# Patient Record
Sex: Male | Born: 1950
Health system: Southern US, Community
[De-identification: ages and names within clinical notes are randomized; demographics above are authoritative.]

## PROBLEM LIST (undated history)

## (undated) DIAGNOSIS — I480 Paroxysmal atrial fibrillation: Secondary | ICD-10-CM

## (undated) DIAGNOSIS — C801 Malignant (primary) neoplasm, unspecified: Secondary | ICD-10-CM

## (undated) DIAGNOSIS — Z86718 Personal history of other venous thrombosis and embolism: Secondary | ICD-10-CM

## (undated) DIAGNOSIS — G4730 Sleep apnea, unspecified: Secondary | ICD-10-CM

## (undated) DIAGNOSIS — M1990 Unspecified osteoarthritis, unspecified site: Secondary | ICD-10-CM

## (undated) DIAGNOSIS — Z7901 Long term (current) use of anticoagulants: Secondary | ICD-10-CM

## (undated) DIAGNOSIS — I119 Hypertensive heart disease without heart failure: Secondary | ICD-10-CM

## (undated) DIAGNOSIS — Z8546 Personal history of malignant neoplasm of prostate: Secondary | ICD-10-CM

## (undated) DIAGNOSIS — E785 Hyperlipidemia, unspecified: Secondary | ICD-10-CM

## (undated) DIAGNOSIS — I4891 Unspecified atrial fibrillation: Secondary | ICD-10-CM

## (undated) DIAGNOSIS — I10 Essential (primary) hypertension: Secondary | ICD-10-CM

## (undated) DIAGNOSIS — E669 Obesity, unspecified: Secondary | ICD-10-CM

## (undated) HISTORY — DX: Hypertensive heart disease without heart failure: I11.9

## (undated) HISTORY — PX: SHOULDER ARTHROTOMY: SHX1050

## (undated) HISTORY — PX: TRANSURETHRAL RESECTION OF PROSTATE: SHX73

## (undated) HISTORY — PX: TONSILLECTOMY: SUR1361

## (undated) HISTORY — PX: WISDOM TOOTH EXTRACTION: SHX21

## (undated) HISTORY — PX: COLONOSCOPY W/ POLYPECTOMY: SHX1380

## (undated) HISTORY — DX: Personal history of other venous thrombosis and embolism: Z86.718

## (undated) HISTORY — DX: Hyperlipidemia, unspecified: E78.5

## (undated) HISTORY — DX: Paroxysmal atrial fibrillation: I48.0

## (undated) HISTORY — DX: Personal history of malignant neoplasm of prostate: Z85.46

## (undated) HISTORY — DX: Sleep apnea, unspecified: G47.30

## (undated) HISTORY — PX: CARDIAC CATHETERIZATION: SHX172

## (undated) HISTORY — DX: Long term (current) use of anticoagulants: Z79.01

---

## 1999-01-21 ENCOUNTER — Encounter: Payer: Self-pay | Admitting: Internal Medicine

## 1999-01-21 ENCOUNTER — Ambulatory Visit (HOSPITAL_COMMUNITY): Admission: RE | Admit: 1999-01-21 | Discharge: 1999-01-21 | Payer: Self-pay | Admitting: Internal Medicine

## 1999-02-22 ENCOUNTER — Encounter: Payer: Self-pay | Admitting: Neurosurgery

## 1999-02-22 ENCOUNTER — Ambulatory Visit (HOSPITAL_COMMUNITY): Admission: RE | Admit: 1999-02-22 | Discharge: 1999-02-22 | Payer: Self-pay | Admitting: Neurosurgery

## 1999-03-10 ENCOUNTER — Encounter: Payer: Self-pay | Admitting: Neurosurgery

## 1999-03-10 ENCOUNTER — Ambulatory Visit (HOSPITAL_COMMUNITY): Admission: RE | Admit: 1999-03-10 | Discharge: 1999-03-10 | Payer: Self-pay | Admitting: Neurosurgery

## 1999-05-19 ENCOUNTER — Ambulatory Visit (HOSPITAL_COMMUNITY): Admission: RE | Admit: 1999-05-19 | Discharge: 1999-05-19 | Payer: Self-pay | Admitting: Gastroenterology

## 2000-01-17 ENCOUNTER — Encounter: Payer: Self-pay | Admitting: Neurosurgery

## 2000-01-17 ENCOUNTER — Ambulatory Visit (HOSPITAL_COMMUNITY): Admission: RE | Admit: 2000-01-17 | Discharge: 2000-01-17 | Payer: Self-pay | Admitting: Neurosurgery

## 2003-01-19 ENCOUNTER — Encounter: Payer: Self-pay | Admitting: Internal Medicine

## 2003-01-19 ENCOUNTER — Encounter: Admission: RE | Admit: 2003-01-19 | Discharge: 2003-01-19 | Payer: Self-pay | Admitting: Internal Medicine

## 2004-04-27 ENCOUNTER — Encounter: Admission: RE | Admit: 2004-04-27 | Discharge: 2004-04-27 | Payer: Self-pay | Admitting: Internal Medicine

## 2004-06-11 ENCOUNTER — Ambulatory Visit (HOSPITAL_COMMUNITY): Admission: RE | Admit: 2004-06-11 | Discharge: 2004-06-11 | Payer: Self-pay | Admitting: Internal Medicine

## 2004-12-08 ENCOUNTER — Encounter (INDEPENDENT_AMBULATORY_CARE_PROVIDER_SITE_OTHER): Payer: Self-pay | Admitting: Specialist

## 2004-12-08 ENCOUNTER — Ambulatory Visit (HOSPITAL_COMMUNITY): Admission: RE | Admit: 2004-12-08 | Discharge: 2004-12-08 | Payer: Self-pay | Admitting: Gastroenterology

## 2004-12-12 ENCOUNTER — Ambulatory Visit (HOSPITAL_COMMUNITY): Admission: RE | Admit: 2004-12-12 | Discharge: 2004-12-12 | Payer: Self-pay | Admitting: Internal Medicine

## 2005-04-19 ENCOUNTER — Encounter (INDEPENDENT_AMBULATORY_CARE_PROVIDER_SITE_OTHER): Payer: Self-pay | Admitting: Specialist

## 2005-04-19 ENCOUNTER — Inpatient Hospital Stay (HOSPITAL_COMMUNITY): Admission: RE | Admit: 2005-04-19 | Discharge: 2005-04-22 | Payer: Self-pay | Admitting: Urology

## 2007-02-08 ENCOUNTER — Ambulatory Visit (HOSPITAL_BASED_OUTPATIENT_CLINIC_OR_DEPARTMENT_OTHER): Admission: RE | Admit: 2007-02-08 | Discharge: 2007-02-08 | Payer: Self-pay | Admitting: Neurology

## 2007-02-17 ENCOUNTER — Ambulatory Visit: Payer: Self-pay | Admitting: Internal Medicine

## 2007-05-03 ENCOUNTER — Ambulatory Visit (HOSPITAL_BASED_OUTPATIENT_CLINIC_OR_DEPARTMENT_OTHER): Admission: RE | Admit: 2007-05-03 | Discharge: 2007-05-03 | Payer: Self-pay | Admitting: Internal Medicine

## 2007-05-12 ENCOUNTER — Ambulatory Visit: Payer: Self-pay | Admitting: Internal Medicine

## 2007-09-11 ENCOUNTER — Ambulatory Visit (HOSPITAL_BASED_OUTPATIENT_CLINIC_OR_DEPARTMENT_OTHER): Admission: RE | Admit: 2007-09-11 | Discharge: 2007-09-11 | Payer: Self-pay | Admitting: Orthopedic Surgery

## 2008-09-24 ENCOUNTER — Encounter: Admission: RE | Admit: 2008-09-24 | Discharge: 2008-09-24 | Payer: Self-pay | Admitting: Specialist

## 2009-01-01 ENCOUNTER — Ambulatory Visit (HOSPITAL_BASED_OUTPATIENT_CLINIC_OR_DEPARTMENT_OTHER): Admission: RE | Admit: 2009-01-01 | Discharge: 2009-01-01 | Payer: Self-pay | Admitting: Orthopedic Surgery

## 2009-01-14 ENCOUNTER — Encounter: Admission: RE | Admit: 2009-01-14 | Discharge: 2009-01-14 | Payer: Self-pay | Admitting: Internal Medicine

## 2010-09-27 ENCOUNTER — Encounter: Admission: RE | Admit: 2010-09-27 | Discharge: 2010-10-19 | Payer: Self-pay | Admitting: Sports Medicine

## 2011-03-20 LAB — BASIC METABOLIC PANEL
BUN: 14 mg/dL (ref 6–23)
CO2: 25 mEq/L (ref 19–32)
Chloride: 109 mEq/L (ref 96–112)
GFR calc non Af Amer: >60 mL/min (ref >60)
Glucose, Bld: 145 mg/dL — ABNORMAL HIGH (ref 70–99)
Potassium: 3.4 mEq/L — ABNORMAL LOW (ref 3.5–5.1)
Sodium: 140 mEq/L (ref 135–145)

## 2011-03-20 LAB — POCT HEMOGLOBIN-HEMACUE: Hemoglobin: 16.4 g/dL (ref 13.0–17.0)

## 2011-03-20 LAB — APTT: aPTT: 27 seconds (ref 24–37)

## 2011-04-18 NOTE — Procedures (Signed)
NAME:  [name], [name]           ACCOUNT NO.:  [id number]   MEDICAL RECORD NO.:  [id number]          PATIENT TYPE:  OUT   LOCATION:  SLEEP CENTER                 FACILITY:  [facility]   PHYSICIAN:  Clinton D. Maple Hudson, MD, FCCP, FACPDATE OF BIRTH:  01[birth year]07/50   DATE OF STUDY:  05/03/2007                            NOCTURNAL POLYSOMNOGRAM   REFERRING PHYSICIAN:   INDICATION FOR STUDY:  Insomnia with sleep apnea.   EPWORTH SLEEPINESS SCORE:  12/24, BMI 34.6, weight 256 pounds.   HOME MEDICATIONS:  Listed and reviewed.  A diagnostic study on February 08, 2007, recorded an AHI of 16.7 per hour.  CPAP titration is requested.   SLEEP ARCHITECTURE:  Total sleep time 308 minutes with sleep efficiency  74%.  Stage 1 was 12%, stage 2 72%, stages 3 and 4 absent, REM 16% of  total sleep time.  Sleep latency 16 minutes, REM latency 96 minutes,  awake after sleep onset 95 minutes, arousal index 5.6.  Topamax,  Coumadin, potassium, and calcium were taken at bedtime.   RESPIRATORY DATA:  CPAP titration protocol.  CPAP was titrated to 11  CWP, AHI 3.4 per hour.  A medium Mirage Quattro mask was chosen, with  heated humidifier.   OXYGEN DATA:  Snoring was prevented by CPAP, which held saturation at  94% on room air.   CARDIAC DATA:  Normal sinus rhythm.   MOVEMENT/PARASOMNIA:  Occasional limb jerk with little effect on sleep.   IMPRESSION/RECOMMENDATION:  1. Successful (CPAP) continuous positive airway pressure titration to      11 (CWP) centimeters of water pressure, (AHI) apnea/hypopnea index      3.4 per hour.  A medium Mirage Quattro mask was used with heated      humidifier.  2. Baseline diagnostic (NPSG) nocturnal polysomnogram on February 08, 2007, had recorded an (AHI) apnea/hypopnea index of 16.7 per hour.     Clinton D. Maple Hudson, MD, [facility], FACP  Diplomate, Biomedical engineer of Sleep Medicine  Electronically Signed    CDY/MEDQ  D:  05/11/2007 13:34:47  T:  05/11/2007 21:30:47  Job:   [id number]

## 2011-04-18 NOTE — Op Note (Signed)
[name], [name]           ACCOUNT NO.:  [id number]   MEDICAL RECORD NO.:  [id number]          PATIENT TYPE:  AMB   LOCATION:  DSC                          FACILITY:  [facility]   PHYSICIAN:  Harvie Junior, M.D.   DATE OF BIRTH:  [birth year]   DATE OF PROCEDURE:  09/11/2007  DATE OF DISCHARGE:                               OPERATIVE REPORT   PREOPERATIVE DIAGNOSES:  1. Impingement acromioclavicular joint arthritis.  2. Questionable rotator cuff tear.   POSTOPERATIVE DIAGNOSES:  1. Impingement acromioclavicular joint arthritis.  2. Questionable rotator cuff tear.  3. Partial thickness rotator cuff tear.   PRINCIPAL PROCEDURES:  1. Arthroscopic subacromial decompression from the lateral and      posterior compartment.  2. Arthroscopic distal clavicle resection from an anterior      compartment.  3. Arthroscopic debridement of the undersurface of rotator cuff as      well as superior rotator cuff as well as subtotal bursectomy.   SURGEON:  Harvie Junior, M.D.   ASSISTANT:  Marshia Ly, P.A.   ANESTHESIA:  General.   BRIEF HISTORY:  [name] is a 60 year old male with a long history  of having had significant impingement and pain in the shoulder.  We had  injected the shoulder a couple of times with excellent improvement, but  he continued to have pain.  He did not have tremendous weakness, but  just had significant pain and night pain.  We talked about treatment  options.   The routine arthroscopic examination revealed that there was an  undersurface rotator cuff tear which was debrided to significant levels.  The biceps tendon was identified and noted to be intact.  The glenoid  was evaluated and debrided.  At this point, the rotator cuff was  debrided on the undersurface and seemed to have a significant amount of  fibers intact.  We marked it with a PDS suture just in case and went out  of the glenohumeral joint and the subacromial space.  An anterolateral  acromioplasty was performed in the lateral posterior compartment.  Distal clavicle resection performed over 18 mm and at this point, the  rotator cuff was evaluated from the top side and noted to have  significant fray in the area where this mark was, so we carefully took  this out, debrided the rotator cuff from the top side, but really there  was excellent rotator cuff integrity, even at this point in the lead  edge of supraspinatus.  At this point, a subtotal bursectomy was  performed.  A finalization was made of the acromioplasty and distal  clavicle resection and once that was completed, the wound was copiously  and thoroughly irrigated and suctioned  dry.  The rest of our portals were closed with a bandage and sterile  compressive dressing was applied.  The patient was taken to recovery,  was noted to be in satisfactory condition.  The estimated blood loss  during the procedure was nothing.      Harvie Junior, M.D.  Electronically Signed     JLG/MEDQ  D:  09/11/2007  T:  09/11/2007  Job:  [id number]   cc:   Harvie Junior, M.D.

## 2011-04-18 NOTE — Op Note (Signed)
NAME:  [name], [name]           ACCOUNT NO.:  [id number]   MEDICAL RECORD NO.:  [id number]          PATIENT TYPE:  AMB   LOCATION:  DSC                          FACILITY:  [facility]   PHYSICIAN:  Harvie Junior, M.D.   DATE OF BIRTH:  1951/01/02   DATE OF PROCEDURE:  01/01/2009  DATE OF DISCHARGE:                               OPERATIVE REPORT   PREOPERATIVE DIAGNOSIS:  Distal clavicle pain status post arthroscopic  distal clavicle resection with a significant growth of heterotopic bone  in the lateral and inferior portions around the [facility] joint.   POSTOPERATIVE DIAGNOSIS:  Distal clavicle pain status post arthroscopic  distal clavicle resection with a significant growth of heterotopic bone  in the lateral and inferior portions around the [facility] joint.   PROCEDURE:  Open distal clavicle resection.   SURGEON:  Harvie Junior, MD   ASSISTANT:  Marshia Ly, PA   ANESTHESIA:  General.   BRIEF HISTORY:  Mr. Schertzer is a 60 year old male with a long history  of having had  subacromial decompression clavicle resection  arthroscopic.  He began having some pain at the [facility] joint.  X-ray show  that he unfortunately had a significant regrowth of heterotopic bone in  that area, interestingly postoperatively, we were reminiscent that he  had a significant bleed.  He takes Coumadin chronically and he had a  bleed in the area of the [facility] joint, which was quite significant.  We had  aspirated it once, but ultimately, I think this was probably the nature  of this.  He ended up growing heterotopic bone, it was such severe  growth of heterotopic bone that I was concerned about going back and  taking this out early, so we waited a year to allow this to mature and  then ultimately he was brought to the operating room for excision of  this.   DESCRIPTION OF PROCEDURE:  The patient was taken to the operating room.  After adequate anesthesia obtained with general anesthetic the patient  was placed supine  on the operating table.  The left arm was prepped and  draped in the usual sterile fashion.  After he was placed to the beach  chair position, all bony prominences were well padded.  Attention was  then turned to the left shoulder where 20 mL of 0.5% Marcaine with  1:100,000 epinephrine was instilled into this area to control bleeding.  At this point, a curved incision was made in Langer's line  subcutaneously and taken down to the level of distal clavicle, easily to  identify, we made an incision in the deltotrapezial fascia over the  distal clavicle and took this out on to the acromion.  We then began  resecting this bone, we used an ACL saw that has stopped and took out  the 2-cm of distal clavicle and then we had really resect this  anteriorly, posteriorly, inferiorly upon to the acromion.  It really it  was quite dramatic, this area of bone.  Ultimately we took this out and  then spent a fair amount of time irrigating and really stopping all  bleeding with  electrocautery, bone waxed the end of the clavicle, bone  waxed the edge of the acromion.  At that point, I again thoroughly  irrigated.  There was no evidence of bleeding.  We then closed the  deltotrapezial fascia with 1 Vicryl running, the skin with 2-0 Vicryl  and 3-0 Monocryl subcuticular stitching.  Benzoin Steri-Strips were  applied.  Sterile  compression dressing was applied.  The patient was taken to recovery and  was noted to be in satisfactory condition.  Of note, Marshia Ly was  present throughout the case and his help was critical exposure of the  case and ultimately was able to provide the cosmetic closure to allow to  limit the time in the operating room.      Harvie Junior, M.D.  Electronically Signed     JLG/MEDQ  D:  01/01/2009  T:  01/02/2009  Job:  [id number]

## 2011-04-21 NOTE — Op Note (Signed)
NAMELEANDRO, BERKOWITZ           ACCOUNT NO.:  [id number]   MEDICAL RECORD NO.:  [id number]          PATIENT TYPE:  AMB   LOCATION:  ENDO                         FACILITY:  [facility]   PHYSICIAN:  Danise Edge, M.D.   DATE OF BIRTH:  [birth year]   DATE OF PROCEDURE:  12/08/2004  DATE OF DISCHARGE:                                 OPERATIVE REPORT   PROCEDURE:  Colonoscopy and polypectomy.   PROCEDURE INDICATION:  Mr. Gorge Almanza is a 59 year old male born  1951/09/21.  Five years ago Mr. Arnell Sieving underwent a colonoscopy and  hyperplastic polyps were removed.  Mr. Arnell Sieving has intermittent painless  hematochezia.  His father died of digestive cancer.   ENDOSCOPIST:  Danise Edge, M.D.   PREMEDICATION:  Versed 6 mg, Versed 50 mg.   PROCEDURE:  After obtaining informed consent, Mr. Arnell Sieving was placed in  the left lateral decubitus position.  I administered intravenous Demerol and  intravenous Versed to achieve conscious sedation for the procedure.  The  patient's blood pressure, oxygen saturation, and cardiac rhythm were  monitored throughout the procedure and documented in the medical record.   Anal inspection and digital rectal exam were normal.  The Olympus adjustable  pediatric colonoscope was introduced into the rectum and advanced to the  cecum.  Colonic preparation for the exam today was excellent.   Rectum normal.   Sigmoid colon and descending colon normal.   Splenic flexure normal.   Transverse colon normal.   Hepatic flexure normal.   Ascending colon normal.   Cecum and ileocecal valve:  A 1 mm sessile polyp was removed from the  proximal cecum with the cold biopsy forceps.   ASSESSMENT:  A diminutive polyp was removed from the cecum; otherwise normal  proctocolonoscopy to the cecum.   RECOMMENDATIONS:  Mr. Arnell Sieving will resume taking his usual dose of  Coumadin today.  I have instructed him to have a prothrombin time drawn in  one  week.      MJ/MEDQ  D:  12/08/2004  T:  12/08/2004  Job:  [id number]   cc:   Thora Lance, M.D.  [address]  [place]  [place] [address]  Fax: [phone]

## 2011-04-21 NOTE — Discharge Summary (Signed)
NAMEDANEIL, BEEM           ACCOUNT NO.:  [id number]   MEDICAL RECORD NO.:  [id number]          PATIENT TYPE:  INP   LOCATION:  0365                         FACILITY:  [facility]   PHYSICIAN:  Valetta Fuller, M.D.  DATE OF BIRTH:  1951-10-08   DATE OF ADMISSION:  04/19/2005  DATE OF DISCHARGE:  04/22/2005                                 DISCHARGE SUMMARY   ADMITTING DIAGNOSIS:  Prostate cancer.   DISCHARGE DIAGNOSIS:  Prostate cancer.   PROCEDURE:  Radical retropubic prostatectomy performed Apr 19, 2005.   DISCHARGE MEDICATIONS:  The patient may resume previous home meds in  addition to Tylox and Lovenox.   ACTIVITY:  No heavy lifting or strenuous exercise for the next six  weeks.   DISCHARGED DIET:  Regular.   FOLLOW-UP:  Patient is to contact the urology center for follow-up  appointment.   BRIEF HISTORY:  Mr. Schill is a 60 year old gentleman with a previous  urologic history who presented for evaluation of mildly elevated PSA of 4.4.  A complex PSA was subsequently performed which demonstrated a free  percentage of 8. The patient subsequently underwent transrectal ultrasound-  guided biopsy of his prostate which demonstrated bilateral Gleason VI  adenocarcinoma. Significantly, the patient did have a history of DVT and is  on chronic anticoagulation. He has had an evaluation by his primary care  doctor and was not found to have any type of hypercoagulable state. His  primary care doctor thought it would be safe to discontinue his Coumadin  several days before surgery and may consider Lovenox following the  procedure. We will adhere to this plan. The patient was subsequently  admitted to [facility] to undergo radical retropubic  prostatectomy.   HOSPITAL COURSE:  Mr. Tally was admitted on Apr 19, 2005 and taken to  the operating room at which time he underwent radical retropubic  prostatectomy. Postoperatively, the patient was transferred to  PACU in  stable condition. For detailed description of the operation, please see the  typed operative note in the chart. The patient did well on the afternoon  following surgery and overnight, on postop day #1, remained afebrile and  hemodynamically stable. His hemoglobin on postop day #1 was 10.3 and his  Blake drain had put out only 30 mL since the time of surgery. The patient's  diet was subsequently advanced and his DVT prophylaxis was reinitiated. The  patient was able to ambulate in the hallway without difficulty.   On postop day #2, the patient's condition remained stable. He was able  tolerate a regular diet without difficulty and ambulate in hallway. Due to  his anticoagulation therapy, decision was made to monitor her for one more  night.   On the morning postop day #3, his Jackson-Pratt drain was removed. He was  discharged home in stable condition.   The plan will be for him to follow-up in urology center in 1 to 2 weeks. He  will contact the office for an appointment.       JP/MEDQ  D:  05/18/2005  T:  05/18/2005  Job:  [id number]

## 2011-04-21 NOTE — Procedures (Signed)
NAME:  COSMO, TETREAULT           ACCOUNT NO.:  [id number]   MEDICAL RECORD NO.:  [id number]          PATIENT TYPE:  OUT   LOCATION:  SLEEP CENTER                 FACILITY:  [facility]   PHYSICIAN:  Clinton D. Maple Hudson, MD, FCCP, FACPDATE OF BIRTH:  10/20/1951   DATE OF STUDY:  02/08/2007                            NOCTURNAL POLYSOMNOGRAM   INDICATION FOR STUDY:  Insomnia with sleep apnea.   EPWORTH SLEEPINESS SCORE:  11/24.   Height 6 feet, weight 256 pounds.   HOME MEDICATIONS:  Listed and reviewed.   SLEEP ARCHITECTURE:  Total sleep time 331 minutes, with sleep efficiency  75%.  Stage I was 7%, stage II 82%, stage III and IV 4%, REM 7% of total  sleep time.  Sleep latency 21 minutes, REM latency 147 minutes.  Awake  after sleep onset 84 minutes.  Arousal index 9.8.  No bedtime medication  was taken.   RESPIRATORY DATA:  Apnea-hypopnea index (AHI, RDI) 16.7 obstructive  events per hour, indicating moderate obstructive sleep apnea/hypopnea  syndrome.  There were 17 obstructive apneas and 75 hypopneas.  Events  were not positional. REM AHI 0.  He did not have enough early events to  meet criteria for split protocol on this study night.   OXYGEN DATA:  Mild to occasionally loud snoring, with oxygen  desaturation to a nadir of 86%.  Mean oxygen saturation through the  study was 93% on room air.   CARDIAC DATA:  Normal sinus rhythm.   MOVEMENT/PARASOMNIA:  Frequent limb jerks, with a total of 174 recorded,  but only 2 of these were associated with arousal or awakening, for an  insignificant periodic limb movement with arousal index of 0.4 per hour.   IMPRESSIONS/RECOMMENDATIONS:  1. Unremarkable sleep architecture for the sleep center environment,      without bedtime medication.  2. Moderate obstructive sleep apnea/hypopnea syndrome, AHI 16.7 per      hour, with nonpositional events, variable snoring, and oxygen      desaturation to a nadir of 86%.  3. Scores in this range may  qualify for a trial of CPAP therapy.      Consider return for CPAP titration or evaluate for alternative      therapies as appropriate.      Clinton D. Maple Hudson, MD, [facility], FACP  Diplomate, Biomedical engineer of Sleep Medicine  Electronically Signed     CDY/MEDQ  D:  02/17/2007 13:47:58  T:  02/18/2007 09:15:51  Job:  [id number]

## 2011-04-21 NOTE — Op Note (Signed)
NAMEARSHDEEP, BOLGER           ACCOUNT NO.:  [id number]   MEDICAL RECORD NO.:  [id number]          PATIENT TYPE:  INP   LOCATION:  0004                         [facility]ACILITY:  [facility]   PHYSICIAN:  Valetta Fuller, M.D.  DATE OF BIRTH:  [birth year]   DATE OF PROCEDURE:  04/19/2005  DATE OF DISCHARGE:                                 OPERATIVE REPORT   PREOPERATIVE DIAGNOSIS:  Clinical stage T1c adenocarcinoma of the prostate.   POSTOPERATIVE DIAGNOSIS:  Clinical stage T1c adenocarcinoma of the prostate.   PROCEDURE PERFORMED:  Radial retropubic prostatectomy.   SURGEON:  Barron Alvine, M.D.   ASSISTANT:  Bailey Mech   ANESTHESIA:  General endotracheal anesthesia.   INDICATIONS:  Mr. Markman is a 60 year old male.  He was recently  evaluated because of a persistently mildly elevated p.o. site between 4.0  and 4.5.  The patient had had an episode of prostatitis but with additional  times and antibiotics this p.o. never completely normalized.  The patient  was also noted to have a markedly reduced PSA reading to 8%.  Eventual  ultrasound revealed nothing significant with a normal prostate size.  Biopsies, however, revealed bilateral Gleason 3 + 3 = 6 adenocarcinoma of  the bladder involving approximately 5% or less of both sides of the  submitted material.  The patient underwent extensive counseling with guarded  treatment options for his clinical stage T1c disease.  This included  observation, radiation approaches or radical retropubic prostatectomy.  After discussing the pro's and con's of all approaches, he elected to  proceed with radical retropubic prostatectomy.  The patient did have an  extensive history of DVT in the past and had been on Coumadin.  He  understood that he was going to be at significantly increased risk.  He was  evaluated by his primary care doctor and apparently did not have any  hypocoagulable issues.  His primary care doctor thought it would be safe to  stop his Coumadin for five day and to reinitiate with Lovenox and Coumadin  several days after the procedure.  We thought overall he may be a better  potential candidate for seed implantation but the patient did want to have  radical prostatectomy.  We did not feel the lymph node dissection was really  indicated.  We felt that that would additional time to his surgery and also  potentially increase the risk for DVT given the manipulation along the iliac  vein.  He presents now for the surgery.   TECHNIQUE AND FINDINGS:  The patient was brought to the operating room where  he had successful induction of general endotracheal anesthesia.  Of note, he  was difficult intubation.  He was placed in the supine position.  Great care  was taken to be sure that all extremities were padded.  He was prepped and  draped in the usual manner.  A Foley catheter was then placed sterilely on  the field.  A standard lower midline incision was performed and the  retropubic space was entered.  Again we did not do a lymph node dissection.  For that reason, we  utilized in great care, placed some lateral wall  retractor blades.  The retropubic space was exposed and bladder was  retracted superiorly.  Endopelvic fascia was incised sharply bilaterally.  Puboprostatic ligaments were identified and sharply divided.  We were easily  able to palpate a groove between the dorsal vein complex and underlying  urethra.  A right angle clamp was passed between these structures and tied  with #1 Vicryl suture.  A suture ligature was also utilized.  The dorsal  vein complex was then transected and hemostasis was quite good.  There was a  very nice underlying urethral stump and the apex of the prostate was very  well preserved and pristine.  Palpation of this area did not reveal any  evidence of obvious induration near the apex laterally and we felt bilateral  nerve sparing procedure was reasonable.  The urethra was transected and  the  Foley catheter was brought up anteriorly.  The posterior wall of the urethra  was then transected and we used sharp dissection to transect the underlying  rectal urethralis muscle/fascia.  With this maneuver, the posterior plane  between the prostate and the rectum was very easily established with  dissection.   The endopelvic fascia was incised sharply on both sides releasing the  neurovascular bundles.  Posteriorly, we were able to identify the surface of  the seminal vesicles which then guided [place] to the proper plane for taking  down the lateral pedicles of the prostate.  Right angle Hem-o-lok clips were  used primarily for this.  Posteriorly, we were able to identify the vas  deferens which were clipped and divided.  The seminal vesicles were then  taken at approximately the mid-portion.  We felt the dissection all the way  to the tip could eventually potentially increase the risk for sexual  dysfunction and we felt that if we obtained the majority of the seminal  vesicle that would be adequate.  Once we had taken the lateral pedicles down  to the bladder neck, we turned to the bladder neck.  A combination of sharp  and blunt dissective technique was used to take the prostate off the bladder  neck preserving the circular fibers.  No bladder neck reconstruction was  necessary and at the completion of the bladder neck accepted the tip of my  little finger.  In this manner, the prostate was removed and sent for  pathologic evaluation.  The patient then underwent preparation for  reanastomosis of the bladder to the urethra.  We everted the mucosa  utilizing some 4-0 Vicryl sutures.  I had already preplaced two lateral  sutures in the urethral stump at the time of the transection of the urethra  with 2-0 Vicryl suture.  Two additional 2-0 Vicryl sutures were placed one  on both sides posteriorly and one at the 12 o'clock position.  These 2-0 Vicryl sutures were then placed in the  corresponding positions at the  bladder neck.  This was done over a 22-French Foley catheter.  The patient  remained quite stable and hemodynamically did quite well.  Estimated blood  loss was in the 800 to 1000 mL range.  The patient had been 2 units of  autologous blood.  We do not see indications for transfusion at least up to  that point.  The pelvis was copiously irrigated.  The bladder was also  copiously irrigated.  The anastomotic sutures were then tied.  The  anastomotic feel appeared to be excellent and there  was no evidence of IV  extravasation.  A separate stab incision was used to place the pelvic brain.  The fascia was closed with running #1 PDS suture and the skin was closed  with clips.  The patient appeared to tolerate the procedure well.  There  were no obvious complications or problems.  Sponge and needle counts were  correct.  He was brought to the recovery room in stable condition.      DSG/MEDQ  D:  04/19/2005  T:  04/19/2005  Job:  [id number]

## 2011-04-21 NOTE — H&P (Signed)
[name], [name]           ACCOUNT NO.:  [id number]   MEDICAL RECORD NO.:  [id number]          PATIENT TYPE:  INP   LOCATION:  0004                         FACILITY:  [facility]   PHYSICIAN:  Valetta Fuller, M.D.  DATE OF BIRTH:  20-Nov-1951   DATE OF ADMISSION:  04/19/2005  DATE OF DISCHARGE:                                HISTORY & PHYSICAL   CHIEF COMPLAINT:  Clinical stage T1C adenocarcinoma of the prostate for  radical retropubic prostatectomy.   HISTORY OF PRESENT ILLNESS:  [name] is a 60 year old male.  He has no  previous urologic history.  The patient had an episode of prostatitis about  6 months ago.  The patient's PSA was mildly elevated at 4.4 after treatment  of his prostatitis.  The feeling by his primary care doctor was that his PSA  elevation may be related to some lingering prostatitis.  The patient  subsequently had a repeat PSA which remained elevated at slightly over 4 and  he had a reduced PSA 2 reading of 8%.  When we saw him, he had no evidence  of ongoing prostatitis or urinary tract inflammation and therefore biopsies  were done.  This revealed a fairly normal size prostate.  The patient was  documented to have bilateral Gleason 6 adenocarcinoma of the prostate.  Less  than 5% of the material was positive on both sides.  The patient underwent  extensive counseling with regard to treatment options.  The patient did have  a history of DVT and has been on chronic anticoagulation.  He has had  evaluation by his primary care doctor and was not found to have any type of  hypercoagulable state.  His primary care doctor thought it would be safe to  discontinue his Coumadin several days before surgery and then consider  Lovenox status post the procedure.  We felt that the patient was potentially  a good candidate for seed implantation as well given what appeared to be low  volume, well differentiated cancer.  We laid out the pros and cons of  various  approaches.  The patient requested that surgery be performed.  We  felt that given his extremely low likelihood of lymph node metastasis, it  would be reasonable to forego pelvic lymph node dissection, especially in  light of the DVT, to minimize any manipulation of the iliac vessels and also  reduce operative room time.  The patient now presents for the surgery.  He  has no complaints and only has minimal voiding symptoms.   PAST MEDICAL HISTORY:  1.  DVT.  He has been on Coumadin for approximately 15 years.  2.  Osteoporosis.   CURRENT MEDICATIONS:  Fosamax, Topamax, hydrochlorothiazide, potassium  replacement, calcium, vitamin D.  He was also on Coumadin which has been on  hold.   ALLERGIES:  No drug allergies.   SOCIAL HISTORY:  He has a previous 20-30 pack year smoking history but quit  5-6 years ago.   FAMILY HISTORY:  Notable for diabetes and hypertension.  Negative for  prostate cancer.   PHYSICAL EXAMINATION:  GENERAL:  The patient is a  well-developed, well-  nourished male.  VITAL SIGNS:  His current weight is approximately 260 pounds.  He is  afebrile, pulse 68, blood pressure 112/70.  NECK:  No masses or JVD.  CHEST:  Clear to auscultation.  ABDOMEN:  Protuberant, soft, without masses or tenderness.  GENITOURINARY:  Penis shows normal meatus, scrotum, testes, and adnexal  structures.  Prostate is 1+ in size without worrisome nodules or induration.  EXTREMITIES:  There is no lower extremity edema or tenderness.   DATA:  CBC, BMET are within normal limits.   ASSESSMENT:  Clinical stage T1C adenocarcinoma of the prostate.  The patient  is to undergo pelvic lymph node dissection later this morning and hopefully  be admitted for routine postoperative care.      DSG/MEDQ  D:  04/19/2005  T:  04/19/2005  Job:  [id number]

## 2011-06-28 ENCOUNTER — Other Ambulatory Visit: Payer: Self-pay | Admitting: Dermatology

## 2011-09-14 LAB — BASIC METABOLIC PANEL
BUN: 12
GFR calc Af Amer: >60
GFR calc non Af Amer: >60
Potassium: 3.6

## 2011-09-14 LAB — POCT HEMOGLOBIN-HEMACUE: Operator id: 116011

## 2011-09-14 LAB — PROTIME-INR
INR: 1.2
Prothrombin Time: 15.5 — ABNORMAL HIGH

## 2012-05-14 ENCOUNTER — Other Ambulatory Visit: Payer: Self-pay | Admitting: Dermatology

## 2012-06-20 ENCOUNTER — Other Ambulatory Visit: Payer: Self-pay | Admitting: Dermatology

## 2014-06-15 ENCOUNTER — Other Ambulatory Visit: Payer: Self-pay | Admitting: Dermatology

## 2014-11-25 ENCOUNTER — Other Ambulatory Visit: Payer: Self-pay | Admitting: Dermatology

## 2014-12-07 DIAGNOSIS — Z7901 Long term (current) use of anticoagulants: Secondary | ICD-10-CM | POA: Diagnosis not present

## 2015-03-30 DIAGNOSIS — G43019 Migraine without aura, intractable, without status migrainosus: Secondary | ICD-10-CM | POA: Diagnosis not present

## 2015-03-30 DIAGNOSIS — G43111 Migraine with aura, intractable, with status migrainosus: Secondary | ICD-10-CM | POA: Diagnosis not present

## 2015-03-30 DIAGNOSIS — Z79899 Other long term (current) drug therapy: Secondary | ICD-10-CM | POA: Diagnosis not present

## 2015-03-30 DIAGNOSIS — R51 Headache: Secondary | ICD-10-CM | POA: Diagnosis not present

## 2015-04-08 DIAGNOSIS — L570 Actinic keratosis: Secondary | ICD-10-CM | POA: Diagnosis not present

## 2015-04-08 DIAGNOSIS — L821 Other seborrheic keratosis: Secondary | ICD-10-CM | POA: Diagnosis not present

## 2015-04-12 DIAGNOSIS — I82409 Acute embolism and thrombosis of unspecified deep veins of unspecified lower extremity: Secondary | ICD-10-CM | POA: Diagnosis not present

## 2015-04-12 DIAGNOSIS — Z7901 Long term (current) use of anticoagulants: Secondary | ICD-10-CM | POA: Diagnosis not present

## 2015-04-22 ENCOUNTER — Other Ambulatory Visit: Payer: Self-pay | Admitting: Gastroenterology

## 2015-06-03 ENCOUNTER — Other Ambulatory Visit: Payer: Self-pay | Admitting: Internal Medicine

## 2015-06-03 DIAGNOSIS — Z8546 Personal history of malignant neoplasm of prostate: Secondary | ICD-10-CM | POA: Diagnosis not present

## 2015-06-03 DIAGNOSIS — Z23 Encounter for immunization: Secondary | ICD-10-CM | POA: Diagnosis not present

## 2015-06-03 DIAGNOSIS — I10 Essential (primary) hypertension: Secondary | ICD-10-CM | POA: Diagnosis not present

## 2015-06-03 DIAGNOSIS — Z1389 Encounter for screening for other disorder: Secondary | ICD-10-CM | POA: Diagnosis not present

## 2015-06-03 DIAGNOSIS — G4733 Obstructive sleep apnea (adult) (pediatric): Secondary | ICD-10-CM | POA: Diagnosis not present

## 2015-06-03 DIAGNOSIS — Z0000 Encounter for general adult medical examination without abnormal findings: Secondary | ICD-10-CM | POA: Diagnosis not present

## 2015-06-03 DIAGNOSIS — Z125 Encounter for screening for malignant neoplasm of prostate: Secondary | ICD-10-CM | POA: Diagnosis not present

## 2015-06-03 DIAGNOSIS — M810 Age-related osteoporosis without current pathological fracture: Secondary | ICD-10-CM | POA: Diagnosis not present

## 2015-06-03 DIAGNOSIS — E291 Testicular hypofunction: Secondary | ICD-10-CM | POA: Diagnosis not present

## 2015-06-03 DIAGNOSIS — Z87891 Personal history of nicotine dependence: Secondary | ICD-10-CM

## 2015-06-03 DIAGNOSIS — R5383 Other fatigue: Secondary | ICD-10-CM | POA: Diagnosis not present

## 2015-06-03 DIAGNOSIS — E559 Vitamin D deficiency, unspecified: Secondary | ICD-10-CM | POA: Diagnosis not present

## 2015-06-10 ENCOUNTER — Ambulatory Visit
Admission: RE | Admit: 2015-06-10 | Discharge: 2015-06-10 | Disposition: A | Discharge status: Home / Self Care | Payer: Medicare Other | Source: Ambulatory Visit | Attending: Internal Medicine | Admitting: Internal Medicine

## 2015-06-10 DIAGNOSIS — Z87891 Personal history of nicotine dependence: Secondary | ICD-10-CM

## 2015-06-10 DIAGNOSIS — Z136 Encounter for screening for cardiovascular disorders: Secondary | ICD-10-CM | POA: Diagnosis not present

## 2015-06-23 DIAGNOSIS — E291 Testicular hypofunction: Secondary | ICD-10-CM | POA: Diagnosis not present

## 2015-07-14 DIAGNOSIS — E291 Testicular hypofunction: Secondary | ICD-10-CM | POA: Diagnosis not present

## 2015-07-23 DIAGNOSIS — Z7901 Long term (current) use of anticoagulants: Secondary | ICD-10-CM | POA: Diagnosis not present

## 2015-08-05 DIAGNOSIS — D3611 Benign neoplasm of peripheral nerves and autonomic nervous system of face, head, and neck: Secondary | ICD-10-CM | POA: Diagnosis not present

## 2015-08-05 DIAGNOSIS — D485 Neoplasm of uncertain behavior of skin: Secondary | ICD-10-CM | POA: Diagnosis not present

## 2015-09-02 DIAGNOSIS — D225 Melanocytic nevi of trunk: Secondary | ICD-10-CM | POA: Diagnosis not present

## 2015-09-02 DIAGNOSIS — Z85828 Personal history of other malignant neoplasm of skin: Secondary | ICD-10-CM | POA: Diagnosis not present

## 2015-09-02 DIAGNOSIS — L570 Actinic keratosis: Secondary | ICD-10-CM | POA: Diagnosis not present

## 2015-09-02 DIAGNOSIS — D485 Neoplasm of uncertain behavior of skin: Secondary | ICD-10-CM | POA: Diagnosis not present

## 2015-09-02 DIAGNOSIS — L089 Local infection of the skin and subcutaneous tissue, unspecified: Secondary | ICD-10-CM | POA: Diagnosis not present

## 2015-09-02 DIAGNOSIS — L821 Other seborrheic keratosis: Secondary | ICD-10-CM | POA: Diagnosis not present

## 2015-09-14 ENCOUNTER — Encounter (HOSPITAL_COMMUNITY): Admission: RE | Payer: Self-pay | Source: Ambulatory Visit

## 2015-09-14 ENCOUNTER — Ambulatory Visit (HOSPITAL_COMMUNITY): Admission: RE | Admit: 2015-09-14 | Payer: Medicare Other | Source: Ambulatory Visit | Admitting: Gastroenterology

## 2015-09-14 SURGERY — COLONOSCOPY WITH PROPOFOL
Anesthesia: Monitor Anesthesia Care

## 2015-09-28 DIAGNOSIS — I10 Essential (primary) hypertension: Secondary | ICD-10-CM | POA: Diagnosis not present

## 2015-09-28 DIAGNOSIS — Z23 Encounter for immunization: Secondary | ICD-10-CM | POA: Diagnosis not present

## 2015-09-28 DIAGNOSIS — G4733 Obstructive sleep apnea (adult) (pediatric): Secondary | ICD-10-CM | POA: Diagnosis not present

## 2015-09-28 DIAGNOSIS — M810 Age-related osteoporosis without current pathological fracture: Secondary | ICD-10-CM | POA: Diagnosis not present

## 2015-09-28 DIAGNOSIS — G43019 Migraine without aura, intractable, without status migrainosus: Secondary | ICD-10-CM | POA: Diagnosis not present

## 2015-09-28 DIAGNOSIS — R1314 Dysphagia, pharyngoesophageal phase: Secondary | ICD-10-CM | POA: Diagnosis not present

## 2015-09-28 DIAGNOSIS — G43111 Migraine with aura, intractable, with status migrainosus: Secondary | ICD-10-CM | POA: Diagnosis not present

## 2015-09-29 ENCOUNTER — Other Ambulatory Visit: Payer: Self-pay | Admitting: Internal Medicine

## 2015-09-29 DIAGNOSIS — R1310 Dysphagia, unspecified: Secondary | ICD-10-CM

## 2015-09-29 DIAGNOSIS — R1319 Other dysphagia: Secondary | ICD-10-CM

## 2015-10-04 ENCOUNTER — Ambulatory Visit
Admission: RE | Admit: 2015-10-04 | Discharge: 2015-10-04 | Disposition: A | Discharge status: Home / Self Care | Payer: Medicare Other | Source: Ambulatory Visit | Attending: Internal Medicine | Admitting: Internal Medicine

## 2015-10-04 DIAGNOSIS — R1310 Dysphagia, unspecified: Secondary | ICD-10-CM | POA: Diagnosis not present

## 2015-10-04 DIAGNOSIS — R1319 Other dysphagia: Secondary | ICD-10-CM

## 2015-10-08 ENCOUNTER — Encounter (HOSPITAL_COMMUNITY): Payer: Self-pay | Admitting: *Deleted

## 2015-10-08 ENCOUNTER — Other Ambulatory Visit: Payer: Self-pay | Admitting: Gastroenterology

## 2015-10-12 ENCOUNTER — Encounter (HOSPITAL_COMMUNITY)
Admission: RE | Disposition: A | Discharge status: Home / Self Care | Payer: Self-pay | Source: Ambulatory Visit | Attending: Gastroenterology

## 2015-10-12 ENCOUNTER — Ambulatory Visit (HOSPITAL_COMMUNITY): Payer: Medicare Other | Admitting: Anesthesiology

## 2015-10-12 ENCOUNTER — Encounter (HOSPITAL_COMMUNITY): Payer: Self-pay

## 2015-10-12 ENCOUNTER — Ambulatory Visit (HOSPITAL_COMMUNITY)
Admission: RE | Admit: 2015-10-12 | Discharge: 2015-10-12 | Disposition: A | Discharge status: Home / Self Care | Payer: Medicare Other | Source: Ambulatory Visit | Attending: Gastroenterology | Admitting: Gastroenterology

## 2015-10-12 DIAGNOSIS — Z86010 Personal history of colonic polyps: Secondary | ICD-10-CM | POA: Diagnosis not present

## 2015-10-12 DIAGNOSIS — Z87891 Personal history of nicotine dependence: Secondary | ICD-10-CM | POA: Insufficient documentation

## 2015-10-12 DIAGNOSIS — D128 Benign neoplasm of rectum: Secondary | ICD-10-CM | POA: Diagnosis not present

## 2015-10-12 DIAGNOSIS — Z86718 Personal history of other venous thrombosis and embolism: Secondary | ICD-10-CM | POA: Insufficient documentation

## 2015-10-12 DIAGNOSIS — Z1211 Encounter for screening for malignant neoplasm of colon: Secondary | ICD-10-CM | POA: Diagnosis not present

## 2015-10-12 DIAGNOSIS — G4733 Obstructive sleep apnea (adult) (pediatric): Secondary | ICD-10-CM | POA: Diagnosis not present

## 2015-10-12 DIAGNOSIS — M4806 Spinal stenosis, lumbar region: Secondary | ICD-10-CM | POA: Insufficient documentation

## 2015-10-12 DIAGNOSIS — Z8546 Personal history of malignant neoplasm of prostate: Secondary | ICD-10-CM | POA: Insufficient documentation

## 2015-10-12 DIAGNOSIS — I10 Essential (primary) hypertension: Secondary | ICD-10-CM | POA: Diagnosis not present

## 2015-10-12 DIAGNOSIS — K621 Rectal polyp: Secondary | ICD-10-CM | POA: Insufficient documentation

## 2015-10-12 DIAGNOSIS — K222 Esophageal obstruction: Secondary | ICD-10-CM | POA: Insufficient documentation

## 2015-10-12 HISTORY — DX: Sleep apnea, unspecified: G47.30

## 2015-10-12 HISTORY — PX: BALLOON DILATION: SHX5330

## 2015-10-12 HISTORY — PX: COLONOSCOPY WITH PROPOFOL: SHX5780

## 2015-10-12 HISTORY — DX: Personal history of other venous thrombosis and embolism: Z86.718

## 2015-10-12 HISTORY — PX: ESOPHAGOGASTRODUODENOSCOPY (EGD) WITH PROPOFOL: SHX5813

## 2015-10-12 HISTORY — DX: Malignant (primary) neoplasm, unspecified: C80.1

## 2015-10-12 HISTORY — DX: Essential (primary) hypertension: I10

## 2015-10-12 HISTORY — DX: Unspecified osteoarthritis, unspecified site: M19.90

## 2015-10-12 SURGERY — BALLOON DILATION
Anesthesia: Monitor Anesthesia Care

## 2015-10-12 MED ORDER — SODIUM CHLORIDE 0.9 % IV SOLN: INTRAVENOUS | Status: DC

## 2015-10-12 MED ORDER — FENTANYL CITRATE (PF) 100 MCG/2ML IJ SOLN
INTRAMUSCULAR | Status: DC | PRN
  Administered 2015-10-12 (×2): 50 ug via INTRAVENOUS

## 2015-10-12 MED ORDER — LACTATED RINGERS IV SOLN
INTRAVENOUS | Status: DC
  Administered 2015-10-12: 07:00:00 via INTRAVENOUS

## 2015-10-12 MED ORDER — PROPOFOL 10 MG/ML IV BOLUS
INTRAVENOUS | Status: AC
  Filled 2015-10-12: qty 20

## 2015-10-12 MED ORDER — PROPOFOL 500 MG/50ML IV EMUL
INTRAVENOUS | Status: DC | PRN
  Administered 2015-10-12: 140 ug/kg/min via INTRAVENOUS

## 2015-10-12 MED ORDER — FENTANYL CITRATE (PF) 100 MCG/2ML IJ SOLN
INTRAMUSCULAR | Status: AC
  Filled 2015-10-12: qty 4

## 2015-10-12 MED ORDER — BUTAMBEN-TETRACAINE-BENZOCAINE 2-2-14 % EX AERO
INHALATION_SPRAY | CUTANEOUS | Status: DC | PRN
  Administered 2015-10-12: 2 via TOPICAL

## 2015-10-12 MED ORDER — PROPOFOL 10 MG/ML IV BOLUS
INTRAVENOUS | Status: DC | PRN
  Administered 2015-10-12 (×3): 20 mg via INTRAVENOUS

## 2015-10-12 MED ORDER — LIDOCAINE HCL (CARDIAC) 20 MG/ML IV SOLN
INTRAVENOUS | Status: AC
  Filled 2015-10-12: qty 5

## 2015-10-12 MED ORDER — LIDOCAINE HCL (CARDIAC) 20 MG/ML IV SOLN
INTRAVENOUS | Status: DC | PRN
Start: 2015-10-12 — End: 2015-10-12
  Administered 2015-10-12: 100 mg via INTRAVENOUS

## 2015-10-12 SURGICAL SUPPLY — 24 items

## 2015-10-12 NOTE — Anesthesia Postprocedure Evaluation (Signed)
Anesthesia Post Note  Patient: [name]  Procedure(s) Performed: Procedure(s) (LRB): COLONOSCOPY WITH PROPOFOL (N/A) ESOPHAGOGASTRODUODENOSCOPY (EGD) WITH PROPOFOL (N/A) BALLOON DILATION (N/A)  Anesthesia type: MAC  Patient location: PACU  Post pain: Pain level controlled  Post assessment: Patient's Cardiovascular Status Stable  Last Vitals:  Filed Vitals:   10/12/15 0840  BP: 141/68  Pulse: 69  Temp:   Resp: 13    Post vital signs: Reviewed and stable  Level of consciousness: sedated  Complications: No apparent anesthesia complications

## 2015-10-12 NOTE — Anesthesia Preprocedure Evaluation (Signed)
Anesthesia Evaluation  Patient identified by MRN, date of birth, ID band Patient awake    Reviewed: Allergy & Precautions, NPO status , Patient's Chart, lab work & pertinent test results  Airway Mallampati: I  TM Distance: >3 FB Neck ROM: Full    Dental   Pulmonary former smoker,    Pulmonary exam normal        Cardiovascular hypertension, Pt. on medications Normal cardiovascular exam     Neuro/Psych    GI/Hepatic   Endo/Other    Renal/GU      Musculoskeletal   Abdominal   Peds  Hematology   Anesthesia Other Findings   Reproductive/Obstetrics                             Anesthesia Physical Anesthesia Plan  ASA: II  Anesthesia Plan: MAC   Post-op Pain Management:    Induction: Intravenous  Airway Management Planned: Simple Face Mask  Additional Equipment:   Intra-op Plan:   Post-operative Plan:   Informed Consent: I have reviewed the patients History and Physical, chart, labs and discussed the procedure including the risks, benefits and alternatives for the proposed anesthesia with the patient or authorized representative who has indicated his/her understanding and acceptance.     Plan Discussed with: CRNA and Surgeon  Anesthesia Plan Comments:         Anesthesia Quick Evaluation  

## 2015-10-12 NOTE — Op Note (Signed)
Procedures: Diagnostic esophagogastroduodenoscopy with distal esophageal stricture dilation followed by surveillance colonoscopy. 03/29/2010 colonoscopy performed with removal of two small tubular adenomatous polyps. 10/04/2015 barium esophagram was performed revealing a smoothly tapering short segment stricture in the distal esophagus.  Endoscopist: Earle Gell  Premedication: Propofol administered by anesthesia  Procedure: Diagnostic esophagogastroduodenoscopy with a benign distal esophageal stricture dilation The patient was placed in the left lateral decubitus position. The Pentax gastroscope was passed through the posterior hypopharynx into the proximal esophagus without difficulty. The hypopharynx, larynx, and vocal cords appeared normal.  Esophagoscopy: The proximal and mid segments of the esophageal mucosa appeared normal. At the esophagogastric junction noted at 45 cm from the incisor teeth was a benign stricture extending less than 1 cm in length. The distal esophageal mucosa otherwise appeared normal. I was able to traverse the stricture with the Pentax gastroscope. Using the esophageal balloon dilator, the benign stricture at the esophagogastric junction was dilated from 15 mm to 16.5 mm without apparent complications. There was no endoscopic evidence presence of erosive esophagitis or Barrett's.  Gastroscopy: Retroflex view of the gastric cardia and fundus was normal. The gastric body, antrum, and pylorus appeared normal.  Duodenoscopy: The duodenal bulb and descending duodenum appeared normal.  Assessment: Benign stricture at the esophagogastric junction was dilated from 15 mm to 16.5 mm using the esophageal balloon dilator. Otherwise normal esophagogastroduodenoscopy  Recommendation: Start omeprazole 20 mg before breakfast each morning to prevent acid reflux into the distal esophagus  Procedure: Surveillance colonoscopy Anal inspection and digital rectal exam were normal. The  Pentax pediatric colonoscope was introduced into the rectum and advanced to the cecum. A normal-appearing ileocecal valve and appendiceal orifice were identified. Colonic preparation for the exam today was good. Withdrawal time was 11 minutes  Rectum. From the mid rectum, a 4 mm sessile polyp was removed with the cold biopsy forceps. Retroflexed view of the distal rectum was normal  Sigmoid colon and descending colon. Normal  Splenic flexure. Normal  Transverse colon. Normal  Hepatic flexure. Normal  Ascending colon. Normal  Cecum and ileocecal valve. Normal  Assessment: A diminutive polyp was removed from the mid rectum with the cold biopsy forceps. Otherwise normal surveillance colonoscopy.  Recommendation: Schedule repeat surveillance colonoscopy in 5 years

## 2015-10-12 NOTE — H&P (Signed)
  Procedure: Surveillance colonoscopy. Diagnostic esophagogastroduodenoscopy with distal esophageal stricture dilation. 03/29/2010 colonoscopy was performed with removal of two small tubular adenomatous colon polyps. 11/31/2016 barium esophagram was performed showing a short segment smooth stricture in the distal esophagus.  History: The patient is a 64 year old male born 09-01-1951. He has chronic solid food esophageal dysphagia. He underwent a barium esophagram with tablet which showed a smooth tapering stricture at the esophagogastric junction; the 13 mm barium tablet stuck in the distal esophagus.  The patient underwent a colonoscopy in April 2011 with removal of adenomatous colon polyps.  To prevent recurrent deep venous thrombosis, the patient takes Coumadin on a daily basis. He stopped taking Coumadin 5 days ago and is on a Lovenox bridge. He received his last dose of Lovenox 24 hours ago.  The patient is scheduled to undergo diagnostic esophagogastroduodenoscopy with distal esophageal stricture dilation followed by surveillance colonoscopy today.  Past medical history: Recurrent deep venous thrombosis of the left leg. Hypertension. Migraine headache syndrome. Osteoporosis complicated by compression fractures. Vitamin D deficiency. Obstructive sleep apnea syndrome. Prostate cancer. Moderate lumbar spinal stenosis. Tonsillectomy. Radical prostatectomy. Left shoulder arthroscopy. Left shoulder surgery.  Exam: The patient is alert and lying comfortably on the endoscopy stretcher. Abdomen is soft and nontender to palpation. Lungs are clear to auscultation. Cardiac exam reveals a regular rhythm.  Plan: Proceed with diagnostic esophagogastroduodenoscopy, distal esophageal stricture dilation, and surveillance colonoscopy

## 2015-10-12 NOTE — Discharge Instructions (Signed)

## 2015-10-12 NOTE — Transfer of Care (Signed)
Immediate Anesthesia Transfer of Care Note  Patient: [name]  Procedure(s) Performed: Procedure(s): COLONOSCOPY WITH PROPOFOL (N/A) ESOPHAGOGASTRODUODENOSCOPY (EGD) WITH PROPOFOL (N/A) BALLOON DILATION (N/A)  Patient Location: Endoscopy Unit  Anesthesia Type:MAC  Level of Consciousness: awake, alert  and oriented  Airway & Oxygen Therapy: Patient Spontanous Breathing  Post-op Assessment: Report given to RN and Post -op Vital signs reviewed and stable  Post vital signs: Reviewed and stable  Last Vitals:  Filed Vitals:   10/12/15 0638  BP: 136/65  Pulse: 64  Temp: 36.7 C  Resp: 15    Complications: No apparent anesthesia complications

## 2015-10-13 ENCOUNTER — Encounter (HOSPITAL_COMMUNITY): Payer: Self-pay | Admitting: Gastroenterology

## 2015-10-15 DIAGNOSIS — Z7901 Long term (current) use of anticoagulants: Secondary | ICD-10-CM | POA: Diagnosis not present

## 2015-12-17 DIAGNOSIS — R5383 Other fatigue: Secondary | ICD-10-CM | POA: Diagnosis not present

## 2016-01-24 DIAGNOSIS — R42 Dizziness and giddiness: Secondary | ICD-10-CM | POA: Diagnosis not present

## 2016-01-24 DIAGNOSIS — I10 Essential (primary) hypertension: Secondary | ICD-10-CM | POA: Diagnosis not present

## 2016-01-24 DIAGNOSIS — R0600 Dyspnea, unspecified: Secondary | ICD-10-CM | POA: Diagnosis not present

## 2016-01-26 DIAGNOSIS — H269 Unspecified cataract: Secondary | ICD-10-CM | POA: Diagnosis not present

## 2016-01-30 DIAGNOSIS — Z7901 Long term (current) use of anticoagulants: Secondary | ICD-10-CM | POA: Diagnosis not present

## 2016-02-01 DIAGNOSIS — R42 Dizziness and giddiness: Secondary | ICD-10-CM | POA: Diagnosis not present

## 2016-02-01 DIAGNOSIS — R739 Hyperglycemia, unspecified: Secondary | ICD-10-CM | POA: Diagnosis not present

## 2016-02-01 DIAGNOSIS — R5383 Other fatigue: Secondary | ICD-10-CM | POA: Diagnosis not present

## 2016-02-02 DIAGNOSIS — R42 Dizziness and giddiness: Secondary | ICD-10-CM | POA: Diagnosis not present

## 2016-02-07 DIAGNOSIS — R0602 Shortness of breath: Secondary | ICD-10-CM | POA: Diagnosis not present

## 2016-02-07 DIAGNOSIS — I10 Essential (primary) hypertension: Secondary | ICD-10-CM | POA: Diagnosis not present

## 2016-02-07 DIAGNOSIS — I872 Venous insufficiency (chronic) (peripheral): Secondary | ICD-10-CM | POA: Diagnosis not present

## 2016-02-07 DIAGNOSIS — R5383 Other fatigue: Secondary | ICD-10-CM | POA: Diagnosis not present

## 2016-02-07 DIAGNOSIS — I82402 Acute embolism and thrombosis of unspecified deep veins of left lower extremity: Secondary | ICD-10-CM | POA: Diagnosis not present

## 2016-02-07 DIAGNOSIS — R079 Chest pain, unspecified: Secondary | ICD-10-CM | POA: Diagnosis not present

## 2016-02-15 DIAGNOSIS — R079 Chest pain, unspecified: Secondary | ICD-10-CM | POA: Diagnosis not present

## 2016-02-22 DIAGNOSIS — R079 Chest pain, unspecified: Secondary | ICD-10-CM | POA: Diagnosis not present

## 2016-02-22 DIAGNOSIS — I340 Nonrheumatic mitral (valve) insufficiency: Secondary | ICD-10-CM | POA: Diagnosis not present

## 2016-02-22 DIAGNOSIS — R9431 Abnormal electrocardiogram [ECG] [EKG]: Secondary | ICD-10-CM | POA: Diagnosis not present

## 2016-02-22 DIAGNOSIS — R002 Palpitations: Secondary | ICD-10-CM | POA: Diagnosis not present

## 2016-02-22 DIAGNOSIS — I208 Other forms of angina pectoris: Secondary | ICD-10-CM | POA: Diagnosis not present

## 2016-02-22 DIAGNOSIS — R42 Dizziness and giddiness: Secondary | ICD-10-CM | POA: Diagnosis not present

## 2016-02-22 DIAGNOSIS — I352 Nonrheumatic aortic (valve) stenosis with insufficiency: Secondary | ICD-10-CM | POA: Diagnosis not present

## 2016-02-23 DIAGNOSIS — R9431 Abnormal electrocardiogram [ECG] [EKG]: Secondary | ICD-10-CM | POA: Diagnosis not present

## 2016-02-23 DIAGNOSIS — R002 Palpitations: Secondary | ICD-10-CM | POA: Diagnosis not present

## 2016-02-23 DIAGNOSIS — I352 Nonrheumatic aortic (valve) stenosis with insufficiency: Secondary | ICD-10-CM | POA: Diagnosis not present

## 2016-02-23 DIAGNOSIS — I340 Nonrheumatic mitral (valve) insufficiency: Secondary | ICD-10-CM | POA: Diagnosis not present

## 2016-02-23 DIAGNOSIS — R079 Chest pain, unspecified: Secondary | ICD-10-CM | POA: Diagnosis not present

## 2016-02-23 DIAGNOSIS — I208 Other forms of angina pectoris: Secondary | ICD-10-CM | POA: Diagnosis not present

## 2016-02-23 DIAGNOSIS — R42 Dizziness and giddiness: Secondary | ICD-10-CM | POA: Diagnosis not present

## 2016-02-25 DIAGNOSIS — I340 Nonrheumatic mitral (valve) insufficiency: Secondary | ICD-10-CM | POA: Diagnosis not present

## 2016-02-25 DIAGNOSIS — I352 Nonrheumatic aortic (valve) stenosis with insufficiency: Secondary | ICD-10-CM | POA: Diagnosis not present

## 2016-02-25 DIAGNOSIS — R079 Chest pain, unspecified: Secondary | ICD-10-CM | POA: Diagnosis not present

## 2016-02-25 DIAGNOSIS — R42 Dizziness and giddiness: Secondary | ICD-10-CM | POA: Diagnosis not present

## 2016-02-25 DIAGNOSIS — I208 Other forms of angina pectoris: Secondary | ICD-10-CM | POA: Diagnosis not present

## 2016-02-25 DIAGNOSIS — R9431 Abnormal electrocardiogram [ECG] [EKG]: Secondary | ICD-10-CM | POA: Diagnosis not present

## 2016-02-25 DIAGNOSIS — R002 Palpitations: Secondary | ICD-10-CM | POA: Diagnosis not present

## 2016-03-13 DIAGNOSIS — I10 Essential (primary) hypertension: Secondary | ICD-10-CM | POA: Diagnosis not present

## 2016-03-13 DIAGNOSIS — R42 Dizziness and giddiness: Secondary | ICD-10-CM | POA: Diagnosis not present

## 2016-03-24 DIAGNOSIS — G43019 Migraine without aura, intractable, without status migrainosus: Secondary | ICD-10-CM | POA: Diagnosis not present

## 2016-03-24 DIAGNOSIS — G43111 Migraine with aura, intractable, with status migrainosus: Secondary | ICD-10-CM | POA: Diagnosis not present

## 2016-03-29 DIAGNOSIS — R42 Dizziness and giddiness: Secondary | ICD-10-CM | POA: Diagnosis not present

## 2016-03-29 DIAGNOSIS — I10 Essential (primary) hypertension: Secondary | ICD-10-CM | POA: Diagnosis not present

## 2016-03-29 DIAGNOSIS — G43909 Migraine, unspecified, not intractable, without status migrainosus: Secondary | ICD-10-CM | POA: Diagnosis not present

## 2016-04-06 DIAGNOSIS — M859 Disorder of bone density and structure, unspecified: Secondary | ICD-10-CM | POA: Diagnosis not present

## 2016-04-06 DIAGNOSIS — M8589 Other specified disorders of bone density and structure, multiple sites: Secondary | ICD-10-CM | POA: Diagnosis not present

## 2016-06-02 DIAGNOSIS — G4733 Obstructive sleep apnea (adult) (pediatric): Secondary | ICD-10-CM | POA: Diagnosis not present

## 2016-06-02 DIAGNOSIS — G43909 Migraine, unspecified, not intractable, without status migrainosus: Secondary | ICD-10-CM | POA: Diagnosis not present

## 2016-06-02 DIAGNOSIS — Z8546 Personal history of malignant neoplasm of prostate: Secondary | ICD-10-CM | POA: Diagnosis not present

## 2016-06-02 DIAGNOSIS — E291 Testicular hypofunction: Secondary | ICD-10-CM | POA: Diagnosis not present

## 2016-06-02 DIAGNOSIS — Z0000 Encounter for general adult medical examination without abnormal findings: Secondary | ICD-10-CM | POA: Diagnosis not present

## 2016-06-02 DIAGNOSIS — Z1389 Encounter for screening for other disorder: Secondary | ICD-10-CM | POA: Diagnosis not present

## 2016-06-02 DIAGNOSIS — I10 Essential (primary) hypertension: Secondary | ICD-10-CM | POA: Diagnosis not present

## 2016-06-02 DIAGNOSIS — Z125 Encounter for screening for malignant neoplasm of prostate: Secondary | ICD-10-CM | POA: Diagnosis not present

## 2016-06-02 DIAGNOSIS — Z23 Encounter for immunization: Secondary | ICD-10-CM | POA: Diagnosis not present

## 2016-06-02 DIAGNOSIS — M810 Age-related osteoporosis without current pathological fracture: Secondary | ICD-10-CM | POA: Diagnosis not present

## 2016-06-02 DIAGNOSIS — Z5181 Encounter for therapeutic drug level monitoring: Secondary | ICD-10-CM | POA: Diagnosis not present

## 2016-06-18 DIAGNOSIS — Z7901 Long term (current) use of anticoagulants: Secondary | ICD-10-CM | POA: Diagnosis not present

## 2016-06-29 DIAGNOSIS — K6289 Other specified diseases of anus and rectum: Secondary | ICD-10-CM | POA: Diagnosis not present

## 2016-06-30 DIAGNOSIS — K6289 Other specified diseases of anus and rectum: Secondary | ICD-10-CM | POA: Diagnosis not present

## 2016-07-03 DIAGNOSIS — K6289 Other specified diseases of anus and rectum: Secondary | ICD-10-CM | POA: Diagnosis not present

## 2016-07-06 ENCOUNTER — Other Ambulatory Visit: Payer: Self-pay | Admitting: Surgery

## 2016-07-06 DIAGNOSIS — R229 Localized swelling, mass and lump, unspecified: Principal | ICD-10-CM

## 2016-07-06 DIAGNOSIS — IMO0002 Reserved for concepts with insufficient information to code with codable children: Secondary | ICD-10-CM

## 2016-07-12 ENCOUNTER — Ambulatory Visit
Admission: RE | Admit: 2016-07-12 | Discharge: 2016-07-12 | Disposition: A | Discharge status: Home / Self Care | Payer: Medicare Other | Source: Ambulatory Visit | Attending: Surgery | Admitting: Surgery

## 2016-07-12 DIAGNOSIS — IMO0002 Reserved for concepts with insufficient information to code with codable children: Secondary | ICD-10-CM

## 2016-07-12 DIAGNOSIS — R229 Localized swelling, mass and lump, unspecified: Principal | ICD-10-CM

## 2016-07-12 DIAGNOSIS — N281 Cyst of kidney, acquired: Secondary | ICD-10-CM | POA: Diagnosis not present

## 2016-07-12 MED ORDER — IOPAMIDOL (ISOVUE-300) INJECTION 61%
125.0000 mL | Freq: Once | INTRAVENOUS | Status: AC | PRN
  Administered 2016-07-12: 125 mL via INTRAVENOUS

## 2016-08-08 DIAGNOSIS — K6289 Other specified diseases of anus and rectum: Secondary | ICD-10-CM | POA: Diagnosis not present

## 2016-09-04 DIAGNOSIS — I10 Essential (primary) hypertension: Secondary | ICD-10-CM | POA: Diagnosis not present

## 2016-09-04 DIAGNOSIS — M79605 Pain in left leg: Secondary | ICD-10-CM | POA: Diagnosis not present

## 2016-09-07 DIAGNOSIS — Z23 Encounter for immunization: Secondary | ICD-10-CM | POA: Diagnosis not present

## 2016-09-09 ENCOUNTER — Encounter (HOSPITAL_COMMUNITY): Payer: Self-pay | Admitting: *Deleted

## 2016-09-09 ENCOUNTER — Emergency Department (HOSPITAL_COMMUNITY): Payer: Medicare Other

## 2016-09-09 ENCOUNTER — Inpatient Hospital Stay (HOSPITAL_COMMUNITY)
Admission: EM | Admit: 2016-09-09 | Discharge: 2016-09-10 | DRG: 310 | Disposition: A | Discharge status: Home / Self Care | Payer: Medicare Other | Attending: Cardiology | Admitting: Cardiology

## 2016-09-09 DIAGNOSIS — R002 Palpitations: Secondary | ICD-10-CM

## 2016-09-09 DIAGNOSIS — Z8546 Personal history of malignant neoplasm of prostate: Secondary | ICD-10-CM

## 2016-09-09 DIAGNOSIS — Z86718 Personal history of other venous thrombosis and embolism: Secondary | ICD-10-CM

## 2016-09-09 DIAGNOSIS — I119 Hypertensive heart disease without heart failure: Secondary | ICD-10-CM | POA: Diagnosis present

## 2016-09-09 DIAGNOSIS — G4733 Obstructive sleep apnea (adult) (pediatric): Secondary | ICD-10-CM | POA: Diagnosis present

## 2016-09-09 DIAGNOSIS — I480 Paroxysmal atrial fibrillation: Secondary | ICD-10-CM | POA: Diagnosis present

## 2016-09-09 DIAGNOSIS — E669 Obesity, unspecified: Secondary | ICD-10-CM | POA: Diagnosis present

## 2016-09-09 DIAGNOSIS — R0602 Shortness of breath: Secondary | ICD-10-CM | POA: Diagnosis not present

## 2016-09-09 DIAGNOSIS — R079 Chest pain, unspecified: Secondary | ICD-10-CM | POA: Diagnosis not present

## 2016-09-09 DIAGNOSIS — Z91013 Allergy to seafood: Secondary | ICD-10-CM

## 2016-09-09 DIAGNOSIS — Z7901 Long term (current) use of anticoagulants: Secondary | ICD-10-CM | POA: Diagnosis not present

## 2016-09-09 DIAGNOSIS — Z87891 Personal history of nicotine dependence: Secondary | ICD-10-CM | POA: Diagnosis not present

## 2016-09-09 DIAGNOSIS — I4891 Unspecified atrial fibrillation: Secondary | ICD-10-CM | POA: Diagnosis not present

## 2016-09-09 DIAGNOSIS — G4730 Sleep apnea, unspecified: Secondary | ICD-10-CM

## 2016-09-09 HISTORY — DX: Unspecified atrial fibrillation: I48.91

## 2016-09-09 HISTORY — DX: Obesity, unspecified: E66.9

## 2016-09-09 LAB — BASIC METABOLIC PANEL
ANION GAP: 10 (ref 5–15)
Anion gap: 11 (ref 5–15)
BUN: 16 mg/dL (ref 6–20)
BUN: 19 mg/dL (ref 6–20)
CALCIUM: 9.1 mg/dL (ref 8.9–10.3)
CHLORIDE: 106 mmol/L (ref 101–111)
CO2: 23 mmol/L (ref 22–32)
CO2: 24 mmol/L (ref 22–32)
CREATININE: 1.07 mg/dL (ref 0.61–1.24)
Calcium: 9.6 mg/dL (ref 8.9–10.3)
Chloride: 106 mmol/L (ref 101–111)
Creatinine, Ser: 1.21 mg/dL (ref 0.61–1.24)
GFR calc Af Amer: >60 mL/min (ref >60)
GFR calc non Af Amer: >60 mL/min (ref >60)
GLUCOSE: 104 mg/dL — AB (ref 65–99)
GLUCOSE: 99 mg/dL (ref 65–99)
POTASSIUM: 3.8 mmol/L (ref 3.5–5.1)
Potassium: 3.5 mmol/L (ref 3.5–5.1)
Sodium: 139 mmol/L (ref 135–145)
Sodium: 141 mmol/L (ref 135–145)

## 2016-09-09 LAB — LIPID PANEL
Cholesterol: 169 mg/dL (ref 0–200)
HDL: 49 mg/dL (ref >40)
LDL CALC: 105 mg/dL — AB (ref 0–99)
Total CHOL/HDL Ratio: 3.4 RATIO
Triglycerides: 77 mg/dL (ref <150)
VLDL: 15 mg/dL (ref 0–40)

## 2016-09-09 LAB — MRSA PCR SCREENING: MRSA by PCR: NEGATIVE

## 2016-09-09 LAB — CBC
HEMATOCRIT: 49.4 % (ref 39.0–52.0)
HEMATOCRIT: 52.9 % — AB (ref 39.0–52.0)
HEMOGLOBIN: 16.9 g/dL (ref 13.0–17.0)
HEMOGLOBIN: 18.3 g/dL — AB (ref 13.0–17.0)
MCH: 31.3 pg (ref 26.0–34.0)
MCH: 30.8 pg (ref 26.0–34.0)
MCHC: 34.2 g/dL (ref 30.0–36.0)
MCHC: 34.6 g/dL (ref 30.0–36.0)
MCV: 90 fL (ref 78.0–100.0)
MCV: 90.6 fL (ref 78.0–100.0)
Platelets: 177 10*3/uL (ref 150–400)
Platelets: 164 10*3/uL (ref 150–400)
RBC: 5.84 MIL/uL — ABNORMAL HIGH (ref 4.22–5.81)
RBC: 5.49 MIL/uL (ref 4.22–5.81)
RDW: 14.2 % (ref 11.5–15.5)
RDW: 14.2 % (ref 11.5–15.5)
WBC: 11.9 10*3/uL — ABNORMAL HIGH (ref 4.0–10.5)
WBC: 10.1 10*3/uL (ref 4.0–10.5)

## 2016-09-09 LAB — PROTIME-INR
INR: 2.66
INR: 2.68
Prothrombin Time: 28.9 seconds — ABNORMAL HIGH (ref 11.4–15.2)
Prothrombin Time: 29.1 seconds — ABNORMAL HIGH (ref 11.4–15.2)

## 2016-09-09 LAB — MAGNESIUM: MAGNESIUM: 2.1 mg/dL (ref 1.7–2.4)

## 2016-09-09 LAB — I-STAT TROPONIN, ED: Troponin i, poc: 0 ng/mL (ref 0.00–0.08)

## 2016-09-09 LAB — TSH: TSH: 3.46 u[IU]/mL (ref 0.350–4.500)

## 2016-09-09 MED ORDER — PREDNISONE 20 MG PO TABS
40.0000 mg | ORAL_TABLET | Freq: Every day | ORAL | Status: DC
  Filled 2016-09-09: qty 2

## 2016-09-09 MED ORDER — DILTIAZEM HCL-DEXTROSE 100-5 MG/100ML-% IV SOLN (PREMIX)
5.0000 mg/h | INTRAVENOUS | Status: DC
  Administered 2016-09-09: 5 mg/h via INTRAVENOUS
  Filled 2016-09-09: qty 100

## 2016-09-09 MED ORDER — ACETAMINOPHEN 325 MG PO TABS: 650.0000 mg | ORAL_TABLET | ORAL | Status: DC | PRN

## 2016-09-09 MED ORDER — LISINOPRIL-HYDROCHLOROTHIAZIDE 20-12.5 MG PO TABS: 1.0000 | ORAL_TABLET | Freq: Every day | ORAL | Status: DC

## 2016-09-09 MED ORDER — POTASSIUM CHLORIDE CRYS ER 10 MEQ PO TBCR
40.0000 meq | EXTENDED_RELEASE_TABLET | Freq: Once | ORAL | Status: AC
  Administered 2016-09-09: 40 meq via ORAL
  Filled 2016-09-09 (×2): qty 4

## 2016-09-09 MED ORDER — HYDROCHLOROTHIAZIDE 12.5 MG PO CAPS
12.5000 mg | ORAL_CAPSULE | Freq: Every day | ORAL | Status: DC
  Administered 2016-09-09 – 2016-09-10 (×2): 12.5 mg via ORAL
  Filled 2016-09-09 (×2): qty 1

## 2016-09-09 MED ORDER — ONDANSETRON HCL 4 MG/2ML IJ SOLN: 4.0000 mg | Freq: Four times a day (QID) | INTRAMUSCULAR | Status: DC | PRN

## 2016-09-09 MED ORDER — DILTIAZEM HCL-DEXTROSE 100-5 MG/100ML-% IV SOLN (PREMIX): 5.0000 mg/h | Freq: Once | INTRAVENOUS | Status: DC

## 2016-09-09 MED ORDER — DILTIAZEM HCL-DEXTROSE 100-5 MG/100ML-% IV SOLN (PREMIX)
5.0000 mg/h | INTRAVENOUS | Status: DC
  Filled 2016-09-09: qty 100

## 2016-09-09 MED ORDER — POTASSIUM CHLORIDE CRYS ER 20 MEQ PO TBCR
20.0000 meq | EXTENDED_RELEASE_TABLET | Freq: Every day | ORAL | Status: DC
  Administered 2016-09-09 – 2016-09-10 (×2): 20 meq via ORAL
  Filled 2016-09-09 (×2): qty 1

## 2016-09-09 MED ORDER — WARFARIN SODIUM 7.5 MG PO TABS
7.5000 mg | ORAL_TABLET | ORAL | Status: AC
  Administered 2016-09-09: 7.5 mg via ORAL
  Filled 2016-09-09: qty 1

## 2016-09-09 MED ORDER — LISINOPRIL 20 MG PO TABS
20.0000 mg | ORAL_TABLET | Freq: Every day | ORAL | Status: DC
  Administered 2016-09-09 – 2016-09-10 (×2): 20 mg via ORAL
  Filled 2016-09-09 (×2): qty 1

## 2016-09-09 MED ORDER — WARFARIN - PHARMACIST DOSING INPATIENT: Freq: Every day | Status: DC

## 2016-09-09 MED ORDER — WARFARIN SODIUM 5 MG PO TABS: 5.0000 mg | ORAL_TABLET | ORAL | Status: DC

## 2016-09-09 MED ORDER — OFF THE BEAT BOOK
Freq: Once | Status: AC
  Administered 2016-09-09: 14:00:00
  Filled 2016-09-09: qty 1

## 2016-09-09 MED ORDER — CALCIUM CARBONATE-VITAMIN D 500-200 MG-UNIT PO TABS
1.0000 | ORAL_TABLET | Freq: Two times a day (BID) | ORAL | Status: DC
  Administered 2016-09-09 – 2016-09-10 (×3): 1 via ORAL
  Filled 2016-09-09 (×3): qty 1

## 2016-09-09 MED ORDER — DILTIAZEM HCL 100 MG IV SOLR: 5.0000 mg/h | Freq: Once | INTRAVENOUS | Status: DC

## 2016-09-09 MED ORDER — FLECAINIDE ACETATE 100 MG PO TABS
100.0000 mg | ORAL_TABLET | Freq: Two times a day (BID) | ORAL | Status: DC
  Administered 2016-09-09 – 2016-09-10 (×3): 100 mg via ORAL
  Filled 2016-09-09 (×3): qty 1

## 2016-09-09 MED ORDER — DILTIAZEM HCL 60 MG PO TABS
60.0000 mg | ORAL_TABLET | Freq: Three times a day (TID) | ORAL | Status: DC
Start: 2016-09-09 — End: 2016-09-10
  Administered 2016-09-09 – 2016-09-10 (×4): 60 mg via ORAL
  Filled 2016-09-09 (×4): qty 1

## 2016-09-09 MED ORDER — DILTIAZEM LOAD VIA INFUSION
10.0000 mg | Freq: Once | INTRAVENOUS | Status: AC
  Administered 2016-09-09: 10 mg via INTRAVENOUS
  Filled 2016-09-09: qty 10

## 2016-09-09 MED ORDER — VITAMIN D 1000 UNITS PO TABS
1000.0000 [IU] | ORAL_TABLET | Freq: Two times a day (BID) | ORAL | Status: DC
  Administered 2016-09-09 – 2016-09-10 (×3): 1000 [IU] via ORAL
  Filled 2016-09-09 (×3): qty 1

## 2016-09-09 NOTE — H&P (Addendum)
History & Physical    Patient ID: [name] MRN: [id number], DOB/AGE: 66/22/1952   Admit date: 09/09/2016   Primary Physician: Irven Shelling, MD Primary Cardiologist: None  Patient Profile    [phone] with prostate CA s/p prostatectomy, esophageal stricture s/p dilatation, DVT on chronic warfarin, HTN, OSA on CPAP, who presents with new onset AF.   Past Medical History    Past Medical History:  Diagnosis Date  . Arthritis    arthritis -back  . Cancer [facility])    cancer Prostate- surgery only  . History of DVT of lower extremity    left leg has some residual crculation issues  . Hypertension   . Sleep apnea    cpap used    Past Surgical History:  Procedure Laterality Date  . BALLOON DILATION N/A 10/12/2015   Procedure: BALLOON DILATION;  Surgeon: Garlan Fair, MD;  Location: [facility];  Service: Endoscopy;  Laterality: N/A;  . COLONOSCOPY W/ POLYPECTOMY     x2 colon polyps found  . COLONOSCOPY WITH PROPOFOL N/A 10/12/2015   Procedure: COLONOSCOPY WITH PROPOFOL;  Surgeon: Garlan Fair, MD;  Location: [facility];  Service: Endoscopy;  Laterality: N/A;  . ESOPHAGOGASTRODUODENOSCOPY (EGD) WITH PROPOFOL N/A 10/12/2015   Procedure: ESOPHAGOGASTRODUODENOSCOPY (EGD) WITH PROPOFOL;  Surgeon: Garlan Fair, MD;  Location: [facility];  Service: Endoscopy;  Laterality: N/A;  . SHOULDER ARTHROTOMY     x2 procedures- 1 open, 1 scope.  . TONSILLECTOMY    . TRANSURETHRAL RESECTION OF PROSTATE       Allergies  Allergies  Allergen Reactions  . Scallops [Shellfish Allergy] Diarrhea and Nausea And Vomiting    History of Present Illness    88M with prostate CA s/p prostatectomy, esophageal stricture s/p dilatation, DVT on chronic warfarin, HTN, OSA on CPAP, who presents with new onset AF.   [name] states that he went to bed at 11pm last night and awoke at 11:30pm with chest pounding, SOB, and rapid pulse. He felt dyspneic at rest and decided to come to  the ER.  Of note, he has a similar feeling episode about 3 weeks ago. It came on in the evening around 8pm and gave him difficulty sleeping but he was eventually able to go to sleep and symptoms resolved by morning. He reports of the last few months, he has felt more easily fatigued and short winded, with occasional palpitations.   On arrival to the ER, the patient was tachycardic to 175bpm with BP 119/69, RR 16, 99%. ECG demonstrated. AF with RVR, HR 166bpm. Labs notable for K 3.8, Cr 1.21, POC TnI 0, INR 2.66. CXR demonstrated no acute cardiopulmonary disease. He was started on IV diltiazem with improvements in his rate to 88bpm on follow-up ECG, although telemetry review demonstrated several excursions to >130bpm.   Of note, the patient has been on warfarin for a DVT in 1990 with longstanding LLE venous stasis changes. He was briefly off warfarin for a few days for a procedure many years ago, and had a recurrent DVT. He is on lifelong warfarin at this point, which is managed by his PCP. He checks his INR at home every 5 weeks and INRs are typically between 2.4-2.6.  He drinks 2 cups of coffee daily, no additional caffeine. Rare alcohol. He is taking a short course of prednisone for skin swelling related to a bee sting.    Home Medications    Prior to Admission medications   Medication Sig Start Date End Date Taking?  Authorizing Provider  calcium-vitamin D (OSCAL WITH D) 500-200 MG-UNIT per tablet Take 1 tablet by mouth 2 (two) times daily with a meal.    Yes Historical Provider, MD  cholecalciferol (VITAMIN D) 1000 UNITS tablet Take 1,000 Units by mouth 2 (two) times daily with a meal.   Yes Historical Provider, MD  lisinopril-hydrochlorothiazide (PRINZIDE,ZESTORETIC) 20-12.5 MG tablet Take 1 tablet by mouth daily.   Yes Historical Provider, MD  potassium chloride SA (K-DUR,KLOR-CON) 20 MEQ tablet Take 20 mEq by mouth daily with lunch.    Yes Historical Provider, MD  predniSONE (DELTASONE) 20 MG  tablet Take 40 mg by mouth daily. 09/04/16  Yes Historical Provider, MD  warfarin (COUMADIN) 5 MG tablet Take 5-7.5 mg by mouth See admin instructions. Takes 5 mg on Monday Wednesday and Friday and 7.5 mg the rest of the week   Yes Historical Provider, MD    Family History    No family history on file.  Social History    Social History   Social History  . Marital status: Married    Spouse name: N/A  . Number of children: N/A  . Years of education: N/A   Occupational History  . Not on file.   Social History Main Topics  . Smoking status: Former Smoker    Years: 15.00    Types: Cigarettes    Quit date: 10/08/2011  . Smokeless tobacco: Never Used  . Alcohol use No  . Drug use: No  . Sexual activity: Not on file   Other Topics Concern  . Not on file   Social History Narrative  . No narrative on file     Review of Systems    General:  No chills, fever, night sweats or weight changes.  Cardiovascular:  No chest pain, edema, orthopnea Dermatological: No rash, lesions/masses Respiratory: No cough, dyspnea Urologic: No hematuria, dysuria Abdominal:   No nausea, vomiting, diarrhea, bright red blood per rectum, melena, or hematemesis Neurologic:  No visual changes, wkns, changes in mental status. All other systems reviewed and are otherwise negative except as noted above.  Physical Exam    Blood pressure 114/75, pulse 77, temperature 97.7 F (36.5 C), temperature source Oral, resp. rate 11, height 6' (1.829 m), weight 115.7 kg (255 lb), SpO2 92 %.  General: Pleasant, NAD Psych: Normal affect. Neuro: Alert and oriented X 3. Moves all extremities spontaneously. HEENT: Normal  Neck: Supple without bruits or JVD. Lungs:  Resp regular and unlabored, CTA. Heart: irregularly irregular Abdomen: Soft, non-tender, non-distended, BS + x 4.  Extremities: No clubbing, cyanosis or edema. DP/PT/Radials 2+ and equal bilaterally. LLE chronic venous stasis changes.  Labs    Troponin  [facility] Test)  Recent Labs  09/09/16 0102  TROPIPOC 0.00   No results for input(s): CKTOTAL, CKMB, TROPONINI in the last 72 hours. Lab Results  Component Value Date   WBC 11.9 (H) 09/09/2016   HGB 18.3 (H) 09/09/2016   HCT 52.9 (H) 09/09/2016   MCV 90.6 09/09/2016   PLT 177 09/09/2016    Recent Labs Lab 09/09/16 0046  NA 139  K 3.8  CL 106  CO2 23  BUN 19  CREATININE 1.21  CALCIUM 9.6  GLUCOSE 104*   No results found for: CHOL, HDL, LDLCALC, TRIG No results found for: [facility]   Radiology Studies    Dg Chest Port 1 View  Result Date: 09/09/2016 CLINICAL DATA:  Palpitations tonight. Chest pain. Hypertension. Shortness of breath. EXAM: PORTABLE CHEST 1 VIEW  COMPARISON:  04/13/2005 FINDINGS: Shallow inspiration with linear scarring in the lung bases. Cardiac enlargement without vascular congestion. No focal airspace disease or consolidation in the lungs. No blunting of costophrenic angles. No pneumothorax. Mediastinal contours appear intact. IMPRESSION: Shallow inspiration with linear fibrosis in the lung bases. No active disease. Electronically Signed   By: Lucienne Capers M.D.   On: 09/09/2016 02:00    ECG & Cardiac Imaging    09-Sep-2016 00:47:20 AF with RVR, HR 166bpm 09-Sep-2016 01:33:20: AF with ventricular rate of 88bpm.   Assessment & Plan    [phone] with prostate CA s/p prostatectomy, esophageal stricture s/p dilatation, DVT on chronic warfarin, HTN, OSA on CPAP, who presents with new onset AF. He is currently in AF with RVR despite modest doses of IV diltiazem. Based on history, he has likely been in and out of AF for months. His CHADSVASC is at least 2, for HTN and Age >10. A TTE is needed to assess LV function. (He denies history of DM, stroke/TIA, PAD/PVD/CAD).  He is currently anticoagulated with warfarin, which will be continued. We briefly discussed that this could be transitioned to a NOAC in the future if he wishes. He requires admission for rate control  (IV to PO diltiazem transition) and may ultimately be best served by a DCCV (+/- TEE) depending on quality of rate control and symptoms in rate controlled AF.   1. Admit to observation 2. Titrate IV diltiazem gtt to achieve rate control 3. Start PO diltiazem 60mg  q8h 4. K>4 (will replete with 20meq), Mg>2 (lab pending) 5. NPO for possible DCCV (+/- TEE) 6. TTE, A1c   Signed, Lamar Sprinkles, MD 09/09/2016, 2:17 AM

## 2016-09-09 NOTE — Plan of Care (Signed)
Problem: Education: Goal: Knowledge of [facility] Education information/materials will improve Outcome: Progressing Pt oriented to unit and floor and processed in as a stepdown pt. Assessment and admission hx completed, see flowsheet. All questions and concerns have been addressed and answered at this time. Pt educated on disease process and new medications. Pt advised to stay in bed to decrease the increase in HR and for safety. Pt verbally agrees to not exit bed unassisted.   Problem: Activity: Goal: Risk for activity intolerance will decrease Outcome: Not Progressing Pt with dyspnea at rest and on exertion. Pt was not able to walk from stretcher to bed without feeling weak, dizzy and HR increasing to the 140's. IV cardizem infusing at this time. Pt advised to stay rested in bed at this time.   Problem: Cardiac: Goal: Ability to achieve and maintain adequate cardiopulmonary perfusion will improve Outcome: Not Progressing Pt with new onset A. Fib with RVR. HR uncontrolled at this time, reaching up to 140-150's on exertion. At rest HR is 80-90's. IV cardizem infusing at 5mg /hr. Pt asymptomatic other just "feeling poorly".  Problem: Education: Goal: Understanding of medication regimen will improve Outcome: Progressing Pt educated on the purpose of IV cardizem and the potential effects. All concerns and questions addressed and answered at this time. Pt on chronic coumadin d/t hx of DVT. Home meds will not change at this time.

## 2016-09-09 NOTE — Plan of Care (Signed)
Problem: Physical Regulation: Goal: Ability to maintain clinical measurements within normal limits will improve Outcome: Progressing Converted to SB after Flecainide dose this am. Cardizem PO.

## 2016-09-09 NOTE — Progress Notes (Signed)
Converted to Sinus Brady with HR in the upper 50's. EKG done. Dr. Wynonia Lawman notified. Diltiazem gtt turned off at this time.

## 2016-09-09 NOTE — ED Provider Notes (Signed)
[facility] DEPT Provider Note   CSN: [id number] Arrival date & time: 09/09/16  [id number]  By signing my name below, I, [name]. [name], attest that this documentation has been prepared under the direction and in the presence of Lacretia Leigh, MD.  Electronically Signed: Maud Richmond. [name], ED Scribe. 09/09/16. 12:58 AM.    History   Chief Complaint Chief Complaint  Patient presents with  . Palpitations   The history is provided by the patient. No language interpreter was used.    HPI Comments: [name] is a 65 y.o. male with a PMHx of HTN who presents to the Emergency Department here for "palpatations" onset this evening at approximately 11:00 PM. Pt states "it felt like my heart was beating fast and irregular". He also reports associated mild chest pain. Pt checked his pulse after onset of symptoms with a reading of 80-150. Denies any new medications. No recent fever, chills, nausea, or vomiting. He admits to a history of same in the past. At that time, pt was placed on a heart monitor without any abnormal findings. Denies any feelings of near syncope. He admits to consuming 1 beer this evening at dinner time. He is currently on a short course of Prednisone for  an insect bite.  PCP: Irven Shelling, MD    Past Medical History:  Diagnosis Date  . Arthritis    arthritis -back  . Cancer [facility])    cancer Prostate- surgery only  . History of DVT of lower extremity    left leg has some residual crculation issues  . Hypertension   . Sleep apnea    cpap used    There are no active problems to display for this patient.   Past Surgical History:  Procedure Laterality Date  . BALLOON DILATION N/A 10/12/2015   Procedure: BALLOON DILATION;  Surgeon: Garlan Fair, MD;  Location: [facility];  Service: Endoscopy;  Laterality: N/A;  . COLONOSCOPY W/ POLYPECTOMY     x2 colon polyps found  . COLONOSCOPY WITH PROPOFOL N/A 10/12/2015   Procedure: COLONOSCOPY WITH PROPOFOL;   Surgeon: Garlan Fair, MD;  Location: [facility];  Service: Endoscopy;  Laterality: N/A;  . ESOPHAGOGASTRODUODENOSCOPY (EGD) WITH PROPOFOL N/A 10/12/2015   Procedure: ESOPHAGOGASTRODUODENOSCOPY (EGD) WITH PROPOFOL;  Surgeon: Garlan Fair, MD;  Location: [facility];  Service: Endoscopy;  Laterality: N/A;  . SHOULDER ARTHROTOMY     x2 procedures- 1 open, 1 scope.  . TONSILLECTOMY    . TRANSURETHRAL RESECTION OF PROSTATE         Home Medications    Prior to Admission medications   Medication Sig Start Date End Date Taking? Authorizing Provider  ANDROGEL PUMP 20.25 MG/ACT (1.62%) GEL Apply 3 application topically daily. Three pumps daily. 09/02/15   Historical Provider, MD  calcium-vitamin D (OSCAL WITH D) 500-200 MG-UNIT per tablet Take 1 tablet by mouth 2 (two) times daily with a meal.     Historical Provider, MD  cholecalciferol (VITAMIN D) 1000 UNITS tablet Take 1,000 Units by mouth 2 (two) times daily with a meal.    Historical Provider, MD  cyclobenzaprine (FLEXERIL) 10 MG tablet Take 5-10 mg by mouth 3 (three) times daily as needed (migraine headache).    Historical Provider, MD  lactase (LACTAID) 3000 UNITS tablet Take 3,000 Units by mouth 3 (three) times daily with meals.    Historical Provider, MD  lisinopril-hydrochlorothiazide (PRINZIDE,ZESTORETIC) 20-12.5 MG tablet Take 1 tablet by mouth daily.    Historical Provider, MD  potassium chloride  SA (K-DUR,KLOR-CON) 20 MEQ tablet Take 20 mEq by mouth daily with lunch.     Historical Provider, MD  verapamil (CALAN-SR) 120 MG CR tablet Take 240 mg by mouth daily with lunch.    Historical Provider, MD  warfarin (COUMADIN) 5 MG tablet Take 5-7.5 mg by mouth daily.    Historical Provider, MD  zonisamide (ZONEGRAN) 100 MG capsule Take 300 mg by mouth daily with supper.     Historical Provider, MD    Family History No family history on file.  Social History Social History  Substance Use Topics  . Smoking status: Former Smoker      Years: 15.00    Types: Cigarettes    Quit date: 10/08/2011  . Smokeless tobacco: Never Used  . Alcohol use No     Allergies   Review of patient's allergies indicates no known allergies.   Review of Systems Review of Systems  Constitutional: Negative for chills and fever.  Respiratory: Negative for cough.   Cardiovascular: Positive for chest pain and palpitations.  Gastrointestinal: Negative for nausea and vomiting.  All other systems reviewed and are negative.    Physical Exam Updated Vital Signs BP 119/69   Pulse 85   Temp 97.7 F (36.5 C) (Oral)   Resp 16   Ht 6' (1.829 m)   Wt 255 lb (115.7 kg)   SpO2 99%   BMI 34.58 kg/m   Physical Exam  Constitutional: He is oriented to person, place, and time. He appears well-developed and well-nourished. No distress.  HENT:  Head: Normocephalic.  Eyes: Conjunctivae are normal. Pupils are equal, round, and reactive to light. No scleral icterus.  Neck: Normal range of motion. Neck supple. No thyromegaly present.  Cardiovascular: An irregular rhythm present. Tachycardia present.  Exam reveals no gallop and no friction rub.   No murmur heard. Pulmonary/Chest: Effort normal and breath sounds normal. No respiratory distress. He has no wheezes. He has no rales.  Abdominal: Soft. Bowel sounds are normal. He exhibits no distension. There is no tenderness. There is no rebound.  Musculoskeletal: Normal range of motion.  Neurological: He is alert and oriented to person, place, and time.  Skin: Skin is warm and dry. No rash noted.  Psychiatric: He has a normal mood and affect. His behavior is normal.     ED Treatments / Results   DIAGNOSTIC STUDIES: Oxygen Saturation is 99% on RA, Normal by my interpretation.    COORDINATION OF CARE: 12:58 AM- Will give Cardizem. Will order blood work and EKG. Discussed treatment plan with pt at bedside and pt agreed to plan.     Labs (all labs ordered are listed, but only abnormal results  are displayed) Labs Reviewed  [facility], ED    EKG  EKG Interpretation  Date/Time:  Saturday September 09 2016 00:47:20 EDT Ventricular Rate:  166 PR Interval:    QRS Duration: 78 QT Interval:  270 QTC Calculation: 448 R Axis:   -2 Text Interpretation:  Undetermined rhythm Nonspecific ST and T wave abnormality Abnormal ECG new from prior Confirmed by Saifan Rayford  MD, Samora Jernberg ([id number]) on 09/09/2016 1:30:31 AM       Radiology No results found.  Procedures Procedures (including critical care time)  Medications Ordered in ED Medications - No data to display   Initial Impression / Assessment and Plan / ED Course  I have reviewed the triage vital signs and the nursing notes.  Pertinent labs & imaging results  that were available during my care of the patient were reviewed by me and considered in my medical decision making (see chart for details).  Clinical Course    Patient started on Cardizem drip after getting Cardizem IV bolus. Heart rate has improved and he remains initial fibrillation. His coagulation status is unknown. Discussed with cardiology and patient be admitted to the service  CRITICAL CARE Performed by: Leota Jacobsen Total critical care time: 50 minutes Critical care time was exclusive of separately billable procedures and treating other patients. Critical care was necessary to treat or prevent imminent or life-threatening deterioration. Critical care was time spent personally by me on the following activities: development of treatment plan with patient and/or surrogate as well as nursing, discussions with consultants, evaluation of patient's response to treatment, examination of patient, obtaining history from patient or surrogate, ordering and performing treatments and interventions, ordering and review of laboratory studies, ordering and review of radiographic studies, pulse oximetry and re-evaluation of patient's  condition.   Final Clinical Impressions(s) / ED Diagnoses   Final diagnoses:  None    New Prescriptions New Prescriptions   No medications on file   I personally performed the services described in this documentation, which was scribed in my presence. The recorded information has been reviewed and is accurate.       Lacretia Leigh, MD 09/09/16 [phone]

## 2016-09-09 NOTE — ED Triage Notes (Signed)
Pt c/o palpitations onset tonight. Pt laid down for bed and felt a racing feeling in chest associated with chest pain. Checked pulse at home and had readings from 80-150. Pt has had this happen on another occassional, symptoms subsided on its own

## 2016-09-09 NOTE — Progress Notes (Signed)
ANTICOAGULATION CONSULT NOTE - Initial Consult  Pharmacy Consult for Coumadin Indication: h/o DVT, new afib  Allergies  Allergen Reactions  . Scallops [Shellfish Allergy] Diarrhea and Nausea And Vomiting    Patient Measurements: Height: 6' (182.9 cm) Weight: 255 lb (115.7 kg) IBW/kg (Calculated) : 77.6  Vital Signs: Temp: 97.7 F (36.5 C) (10/07 0043) Temp Source: Oral (10/07 0043) BP: 108/62 (10/07 0345) Pulse Rate: 66 (10/07 0345)  Labs:  Recent Labs  09/09/16 0046  HGB 18.3*  HCT 52.9*  PLT 177  LABPROT 28.9*  INR 2.66  CREATININE 1.21    Estimated Creatinine Clearance: 78.8 mL/min (by C-G formula based on SCr of 1.21 mg/dL).   Medical History: Past Medical History:  Diagnosis Date  . Arthritis    arthritis -back  . Cancer [facility])    cancer Prostate- surgery only  . History of DVT of lower extremity    left leg has some residual crculation issues  . Hypertension   . Sleep apnea    cpap used    Medications:  Prescriptions Prior to Admission  Medication Sig Dispense Refill Last Dose  . calcium-vitamin D (OSCAL WITH D) 500-200 MG-UNIT per tablet Take 1 tablet by mouth 2 (two) times daily with a meal.    09/08/2016 at Unknown time  . cholecalciferol (VITAMIN D) 1000 UNITS tablet Take 1,000 Units by mouth 2 (two) times daily with a meal.   09/08/2016 at Unknown time  . lisinopril-hydrochlorothiazide (PRINZIDE,ZESTORETIC) 20-12.5 MG tablet Take 1 tablet by mouth daily.   09/08/2016 at Unknown time  . potassium chloride SA (K-DUR,KLOR-CON) 20 MEQ tablet Take 20 mEq by mouth daily with lunch.    09/08/2016 at Unknown time  . predniSONE (DELTASONE) 20 MG tablet Take 40 mg by mouth daily.   09/08/2016 at Unknown time  . warfarin (COUMADIN) 5 MG tablet Take 5-7.5 mg by mouth See admin instructions. Takes 5 mg on Monday Wednesday and Friday and 7.5 mg the rest of the week   09/08/2016 at 2000    Assessment: 65 y.o. M presents with CP - found to be in new onset afib. Pt  on coumadin PTA for h/o DVT. Admission INR 2.66 - therapeutic. CBC ok on admission. To continue coumadin. Pt for possible DCCV. Home dose: 7.5mg  daily except for 5mg  M/W/F - last taken 10/6 pm  Goal of Therapy:  INR 2-3 Monitor platelets by anticoagulation protocol: Yes   Plan:  Daily INR Continue home dose of coumadin (7.5mg  daily except for 5mg  M/W/F)  Sherlon Handing, PharmD, BCPS Clinical pharmacist, pager [phone] 09/09/2016,4:16 AM

## 2016-09-09 NOTE — Progress Notes (Signed)
Subjective:  Patient new to cardiology and admitted last night with rapid atrial fibrillation.  He had previously worn a cardiac event monitor through his primary doctor and no previous diagnosis of atrial fibrillation has been made.  Onset of palpitations last night.  He saw a cardiologist in [place] earlier this year and reports having had a stress test and also had an echo that he states showed some thickening of one of his heart valves.  He feels fine at rest but does have rapid atrial fibrillation when he gets up and walks around.  No anginal type pain.  He has been on warfarin for chronic DVT and does self testing at home and is normally therapeutic.  He is currently therapeutic at the present time.  Has known sleep apnea currently on CPAP.  He was on verapamil earlier in the year for hypertension but was taken off of it.  He also has a history of migraines.  Objective:  Vital Signs in the last 24 hours: BP 104/74 (BP Location: Left Arm)   Pulse 96   Temp 98.4 F (36.9 C) (Oral)   Resp 14   Ht 6' (1.829 m)   Wt 116.2 kg (256 lb 3.2 oz)   SpO2 95%   BMI 34.75 kg/m   Physical Exam: Pleasant obese male in no acute distress. Lungs:  Clear  Cardiac:  Irregular rhythm, normal S1 and S2, no S3 Abdomen:  Soft, nontender, no masses Extremities:  No edema present  Intake/Output from previous day: 10/06 0701 - 10/07 0700 In: 96.3 [P.O.:75; I.V.:21.3] Out: -  Weight Filed Weights   09/09/16 0044 09/09/16 0417  Weight: 115.7 kg (255 lb) 116.2 kg (256 lb 3.2 oz)    Lab Results: Basic Metabolic Panel:  Recent Labs  09/09/16 0046 09/09/16 0421  NA 139 141  K 3.8 3.5  CL 106 106  CO2 23 24  GLUCOSE 104* 99  BUN 19 16  CREATININE 1.21 1.07    CBC:  Recent Labs  09/09/16 0046 09/09/16 0421  WBC 11.9* 10.1  HGB 18.3* 16.9  HCT 52.9* 49.4  MCV 90.6 90.0  PLT 177 164   PROTIME: Lab Results  Component Value Date   INR 2.68 09/09/2016   INR 2.66 09/09/2016   INR 1.1  12/31/2008    Telemetry: Atrial fibrillation currently controlled but rate goes up when he ambulates.  Assessment/Plan:  1.  New onset of atrial fibrillation somewhat rapid response but currently controlled 2.  Hypertensive heart disease 3.  Long-term use of anticoagulation currently therapeutic 4.  Sleep apnea  Recommendations:  Education about atrial fibrillation.  With history will await echocardiogram.  Initiate therapy with flecainide to try to convert to sinus rhythm.  He has been having some intermittent palpitations likely this is been an intermittent problem that will need suppression.      Kerry Hough  MD [facility]  09/09/2016, 9:57 AM

## 2016-09-10 ENCOUNTER — Encounter (HOSPITAL_COMMUNITY): Payer: Self-pay | Admitting: Student

## 2016-09-10 ENCOUNTER — Inpatient Hospital Stay (HOSPITAL_COMMUNITY): Payer: Medicare Other

## 2016-09-10 DIAGNOSIS — Z7901 Long term (current) use of anticoagulants: Secondary | ICD-10-CM

## 2016-09-10 DIAGNOSIS — R002 Palpitations: Secondary | ICD-10-CM

## 2016-09-10 HISTORY — DX: Long term (current) use of anticoagulants: Z79.01

## 2016-09-10 LAB — ECHOCARDIOGRAM COMPLETE
HEIGHTINCHES: 72 in
Weight: 4092.8 oz

## 2016-09-10 LAB — PROTIME-INR
INR: 2.78
PROTHROMBIN TIME: 29.9 s — AB (ref 11.4–15.2)

## 2016-09-10 MED ORDER — WARFARIN SODIUM 7.5 MG PO TABS: 7.5000 mg | ORAL_TABLET | ORAL | Status: DC

## 2016-09-10 MED ORDER — DILTIAZEM HCL ER COATED BEADS 180 MG PO CP24: 180.0000 mg | ORAL_CAPSULE | Freq: Every day | ORAL | 12 refills | Status: DC

## 2016-09-10 MED ORDER — FLECAINIDE ACETATE 100 MG PO TABS: 100.0000 mg | ORAL_TABLET | Freq: Two times a day (BID) | ORAL | 12 refills | Status: DC

## 2016-09-10 MED ORDER — WARFARIN SODIUM 5 MG PO TABS: 5.0000 mg | ORAL_TABLET | ORAL | Status: DC

## 2016-09-10 MED ORDER — DILTIAZEM HCL ER COATED BEADS 180 MG PO CP24
180.0000 mg | ORAL_CAPSULE | Freq: Every day | ORAL | Status: DC
  Administered 2016-09-10: 180 mg via ORAL
  Filled 2016-09-10: qty 1

## 2016-09-10 NOTE — Discharge Summary (Signed)
Discharge Summary    Patient ID: [name],  MRN: [id number], DOB/AGE: 1951/10/02 65 y.o.  Admit date: 09/09/2016 Discharge date: 09/10/2016  Primary Care Provider: Irven Shelling Primary Cardiologist: Dr. Wynonia Lawman  Discharge Diagnoses    Principal Problem:   Atrial fibrillation with rapid ventricular response [facility]) Active Problems:   Chronic anticoagulation   Intermittent palpitations    History of Present Illness     [name] is a 65 y.o. male with past medical history of prostate cancer (s/p prostatectomy), recurrent DVT's (on chronic Coumadin), HTN, and OSA (on CPAP) who presented to [facility] ED on 09/09/2016 for evaluation of palpitations.   Reported going to bed at 11:00 PM and awaking at 11:30PM with chest pounding, SOB, and rapid pulse. He felt dyspneic at rest and decided to come to the ER.  He reported having similar symptoms 3 weeks ago with symptoms resolving spontaneously.   On arrival to the ER, he was tachycardic to 175bpm with BP 119/69, RR 16, 99%. ECG demonstrated AF with RVR, HR 166bpm. Labs notable for K 3.8, Cr 1.21, POC TnI 0, INR 2.66. CXR demonstrated no acute cardiopulmonary disease. He was started on IV Diltiazem with improvements in his rate to 88bpm on follow-up ECG, although telemetry review demonstrated several excursions to >130bpm.   He was therefore admitted for further observation.    Hospital Course     Consultants: None  The following morning, he reported improvement in his symptoms. TSH and Mg were within normal limits. IV Diltiazem was switched to short-acting PO Diltiazem 60mg  Q6H. Remaining in atrial fibrillation, he was started on Flecainide 100mg  BID. Reported having a normal NST in [place] earlier this year and no known previous CAD.  He later converted to NSR that afternoon. This was confirmed with an EKG. He was maintaining NSR on 09/10/2016. He denied any repeat palpitations. Echocardiogram showed preserved EF  of 55-60% with no regional WMA. LA was moderately to severely dilated. Short acting Cardizem was switched to Cardizem CD 180mg  daily. He will continue on Flecainide 100mg  BID. Rx's were provided for both of these new medications.    He was last examined by Dr. Wynonia Lawman and deemed stable for discharge. Was instructed to follow-up within the next two weeks. He was discharged home in good condition.    _____________  Discharge Vitals Blood pressure 126/64, pulse (!) 58, temperature 97.8 F (36.6 C), temperature source Oral, resp. rate 12, height 6' (1.829 m), weight 255 lb 12.8 oz (116 kg), SpO2 95 %.  Filed Weights   09/09/16 0044 09/09/16 0417 09/10/16 0414  Weight: 255 lb (115.7 kg) 256 lb 3.2 oz (116.2 kg) 255 lb 12.8 oz (116 kg)    Labs & Radiologic Studies     CBC  Recent Labs  09/09/16 0046 09/09/16 0421  WBC 11.9* 10.1  HGB 18.3* 16.9  HCT 52.9* 49.4  MCV 90.6 90.0  PLT 177 [id number]   Basic Metabolic Panel  Recent Labs  09/09/16 0046 09/09/16 0421  NA 139 141  K 3.8 3.5  CL 106 106  CO2 23 24  GLUCOSE 104* 99  BUN 19 16  CREATININE 1.21 1.07  CALCIUM 9.6 9.1  MG  --  2.1   Liver Function Tests No results for input(s): AST, ALT, ALKPHOS, BILITOT, PROT, ALBUMIN in the last 72 hours. No results for input(s): LIPASE, AMYLASE in the last 72 hours. Cardiac Enzymes No results for input(s): CKTOTAL, CKMB, CKMBINDEX, TROPONINI in the last 72 hours.  BNP Invalid input(s): POCBNP D-Dimer No results for input(s): DDIMER in the last 72 hours. Hemoglobin A1C No results for input(s): HGBA1C in the last 72 hours. Fasting Lipid Panel  Recent Labs  09/09/16 0421  CHOL 169  HDL 49  LDLCALC 105*  TRIG 77  CHOLHDL 3.4   Thyroid Function Tests  Recent Labs  09/09/16 0139  TSH 3.460    Dg Chest Port 1 View  Result Date: 09/09/2016 CLINICAL DATA:  Palpitations tonight. Chest pain. Hypertension. Shortness of breath. EXAM: PORTABLE CHEST 1 VIEW COMPARISON:  04/13/2005  FINDINGS: Shallow inspiration with linear scarring in the lung bases. Cardiac enlargement without vascular congestion. No focal airspace disease or consolidation in the lungs. No blunting of costophrenic angles. No pneumothorax. Mediastinal contours appear intact. IMPRESSION: Shallow inspiration with linear fibrosis in the lung bases. No active disease. Electronically Signed   By: Lucienne Capers M.D.   On: 09/09/2016 02:00    Diagnostic Studies/Procedures     Echocardiogram:  Left ventricle:  The cavity size was normal. Systolic function was normal. The estimated ejection fraction was in the range of 55% to 60%. Wall motion was normal; there were no regional wall motion abnormalities. ------------------------------------------------------------------- Aortic valve:   Trileaflet; normal thickness leaflets. Mobility was not restricted.  Doppler:  Transvalvular velocity was within the normal range. There was no stenosis. There was no regurgitation. ------------------------------------------------------------------- Aorta:  Aortic root: The aortic root was normal in size. ------------------------------------------------------------------- Mitral valve:   Structurally normal valve.   Mobility was not restricted.  Doppler:  Transvalvular velocity was within the normal range. There was no evidence for stenosis. There was no regurgitation.    Peak gradient (D): 3 mm Hg. ------------------------------------------------------------------- Left atrium:  The atrium was moderately to severely dilated. ------------------------------------------------------------------- Right ventricle:  The cavity size was normal. Wall thickness was normal. Systolic function was normal. ------------------------------------------------------------------- Pulmonic valve:   Not well visualized.  The valve appears to be grossly normal.    Doppler:  Transvalvular velocity was within the normal range. There was no  evidence for stenosis. ------------------------------------------------------------------- Tricuspid valve:   Structurally normal valve.    Doppler: Transvalvular velocity was within the normal range. There was no regurgitation. ------------------------------------------------------------------- Pulmonary artery:   The main pulmonary artery was normal-sized. ------------------------------------------------------------------- Right atrium:  The atrium was normal in size. ------------------------------------------------------------------- Pericardium:  There was no pericardial effusion. ------------------------------------------------------------------- Systemic veins: Inferior vena cava: The vessel was normal in size.   Disposition   Pt is being discharged home today in good condition.  Follow-up Plans & Appointments    Follow-up Information    W Tollie Eth, MD Follow up in 2 week(s).   Specialty:  Cardiology Why:  Call office to make appointment to see me in 2 weeks Contact information: [address] [place] [address] [phone]          Discharge Instructions    Call MD for:  severe uncontrolled pain    Complete by:  As directed    Diet - low sodium heart healthy    Complete by:  As directed    Discharge instructions    Complete by:  As directed    Limited alcohol to one drink occasionally.  May use one to 2 cups of coffee per day.  The device we discussed in the hospital was the Alive cor device.  Adult nurse.   Increase activity slowly    Complete by:  As directed       Discharge Medications  Medication List    TAKE these medications   calcium-vitamin D 500-200 MG-UNIT tablet Commonly known as:  OSCAL WITH D Take 1 tablet by mouth 2 (two) times daily with a meal.   cholecalciferol 1000 units tablet Commonly known as:  VITAMIN D Take 1,000 Units by mouth 2 (two) times daily with a meal.   diltiazem 180 MG 24 hr  capsule Commonly known as:  CARDIZEM CD Take 1 capsule (180 mg total) by mouth daily.   flecainide 100 MG tablet Commonly known as:  TAMBOCOR Take 1 tablet (100 mg total) by mouth every 12 (twelve) hours.   lisinopril-hydrochlorothiazide 20-12.5 MG tablet Commonly known as:  PRINZIDE,ZESTORETIC Take 1 tablet by mouth daily.   potassium chloride SA 20 MEQ tablet Commonly known as:  K-DUR,KLOR-CON Take 20 mEq by mouth daily with lunch.   warfarin 5 MG tablet Commonly known as:  COUMADIN Take 5-7.5 mg by mouth See admin instructions. Takes 5 mg on Monday Wednesday and Friday and 7.5 mg the rest of the week        Allergies Allergies  Allergen Reactions  . Scallops [Shellfish Allergy] Diarrhea and Nausea And Vomiting     Outstanding Labs/Studies   None  Duration of Discharge Encounter   Greater than 30 minutes including physician time.  Signed, Manuel Heritage, PA-C 09/10/2016, 11:15 AM    Agree with above  Ezzard Standing, Brooke Bonito. MD [facility] 09/10/2016

## 2016-09-10 NOTE — Progress Notes (Signed)
Subjective:  Converted to sinus rhythm after first dose of flecainide yesterday and remains in sinus rhythm.  EKG looks good.  He feels much better this morning.  Echocardiogram shows moderate to marked left atrial enlargement with normal LV function.  Has been chronic anticoagulation previously and does home self testing.  Objective:  Vital Signs in the last 24 hours: BP 126/64 (BP Location: Left Arm)   Pulse (!) 58   Temp 97.8 F (36.6 C) (Oral)   Resp 12   Ht 6' (1.829 m)   Wt 116 kg (255 lb 12.8 oz)   SpO2 95%   BMI 34.69 kg/m   Physical Exam: Pleasant obese male in no acute distress. Lungs:  Clear  Cardiac:  Irregular rhythm, normal S1 and S2, no S3 Abdomen:  Soft, nontender, no masses Extremities:  No edema present  Intake/Output from previous day: 10/07 0701 - 10/08 0700 In: 265 [P.O.:240; I.V.:25] Out: -  Weight Filed Weights   09/09/16 0044 09/09/16 0417 09/10/16 0414  Weight: 115.7 kg (255 lb) 116.2 kg (256 lb 3.2 oz) 116 kg (255 lb 12.8 oz)    Lab Results: Basic Metabolic Panel:  Recent Labs  09/09/16 0046 09/09/16 0421  NA 139 141  K 3.8 3.5  CL 106 106  CO2 23 24  GLUCOSE 104* 99  BUN 19 16  CREATININE 1.21 1.07    CBC:  Recent Labs  09/09/16 0046 09/09/16 0421  WBC 11.9* 10.1  HGB 18.3* 16.9  HCT 52.9* 49.4  MCV 90.6 90.0  PLT 177 164   PROTIME: Lab Results  Component Value Date   INR 2.78 09/10/2016   INR 2.68 09/09/2016   INR 2.66 09/09/2016    Telemetry: Atrial fibrillation currently controlled but rate goes up when he ambulates.  Assessment/Plan:  1.  New onset of atrial fibrillation With conversion to sinus rhythm  2.  Hypertensive heart disease 3.  Long-term use of anticoagulation currently therapeutic 4.  Sleep apnea  Recommendations:  Education about atrial fibrillation.  Recommended that he get the alive cor device to help with home monitoring.  Will discharge home on diltiazem and oral as well as flecainide 100  mg twice daily.  Follow-up with me in 2 weeks.   Kerry Hough  MD [facility]  09/10/2016, 10:52 AM

## 2016-09-10 NOTE — Progress Notes (Signed)
ANTICOAGULATION CONSULT NOTE - Initial Consult  Pharmacy Consult for Coumadin Indication: h/o DVT, new afib  Allergies  Allergen Reactions  . Scallops [Shellfish Allergy] Diarrhea and Nausea And Vomiting    Patient Measurements: Height: 6' (182.9 cm) Weight: 255 lb 12.8 oz (116 kg) IBW/kg (Calculated) : 77.6  Vital Signs: Temp: 97.8 F (36.6 C) (10/08 0414) Temp Source: Oral (10/08 0414) BP: 126/64 (10/08 0414) Pulse Rate: 58 (10/08 0600)  Labs:  Recent Labs  09/09/16 0046 09/09/16 0421 09/10/16 0508  HGB 18.3* 16.9  --   HCT 52.9* 49.4  --   PLT 177 164  --   LABPROT 28.9* 29.1* 29.9*  INR 2.66 2.68 2.78  CREATININE 1.21 1.07  --     Estimated Creatinine Clearance: 89.3 mL/min (by C-G formula based on SCr of 1.07 mg/dL).  Assessment: 65 y.o. M presents with CP - found to be in new onset afib. Pt on coumadin PTA for h/o DVT. Continue coumadin, INR 2.78, therapeutic today.  Home dose: 7.5mg  daily except for 5mg  M/W/F - last taken 10/6 pm  Goal of Therapy:  INR 2-3 Monitor platelets by anticoagulation protocol: Yes   Plan:  Daily INR Continue home dose of coumadin (7.5mg  daily except for 5mg  M/W/F)  Maryanna Shape, PharmD, BCPS  Clinical Pharmacist  Pager: [phone]   09/10/2016,10:21 AM

## 2016-09-10 NOTE — Progress Notes (Signed)
  Echocardiogram 2D Echocardiogram has been performed.  [name] 09/10/2016, 8:51 AM

## 2016-09-12 DIAGNOSIS — B309 Viral conjunctivitis, unspecified: Secondary | ICD-10-CM | POA: Diagnosis not present

## 2016-09-29 ENCOUNTER — Encounter (HOSPITAL_COMMUNITY): Payer: Self-pay | Admitting: Cardiology

## 2016-09-29 DIAGNOSIS — I119 Hypertensive heart disease without heart failure: Secondary | ICD-10-CM

## 2016-09-29 DIAGNOSIS — G4733 Obstructive sleep apnea (adult) (pediatric): Secondary | ICD-10-CM | POA: Diagnosis not present

## 2016-09-29 DIAGNOSIS — Z7901 Long term (current) use of anticoagulants: Secondary | ICD-10-CM | POA: Diagnosis not present

## 2016-09-29 DIAGNOSIS — E668 Other obesity: Secondary | ICD-10-CM | POA: Diagnosis not present

## 2016-09-29 DIAGNOSIS — Z8546 Personal history of malignant neoplasm of prostate: Secondary | ICD-10-CM

## 2016-09-29 DIAGNOSIS — Z86718 Personal history of other venous thrombosis and embolism: Secondary | ICD-10-CM

## 2016-09-29 DIAGNOSIS — E669 Obesity, unspecified: Secondary | ICD-10-CM | POA: Diagnosis present

## 2016-09-29 DIAGNOSIS — I480 Paroxysmal atrial fibrillation: Secondary | ICD-10-CM | POA: Diagnosis not present

## 2016-09-29 DIAGNOSIS — G4730 Sleep apnea, unspecified: Secondary | ICD-10-CM

## 2016-09-29 DIAGNOSIS — R002 Palpitations: Secondary | ICD-10-CM | POA: Diagnosis not present

## 2016-09-29 HISTORY — DX: Hypertensive heart disease without heart failure: I11.9

## 2016-10-25 DIAGNOSIS — Z7901 Long term (current) use of anticoagulants: Secondary | ICD-10-CM | POA: Diagnosis not present

## 2016-11-30 DIAGNOSIS — I480 Paroxysmal atrial fibrillation: Secondary | ICD-10-CM | POA: Diagnosis not present

## 2016-12-13 DIAGNOSIS — L0292 Furuncle, unspecified: Secondary | ICD-10-CM | POA: Diagnosis not present

## 2016-12-14 DIAGNOSIS — D485 Neoplasm of uncertain behavior of skin: Secondary | ICD-10-CM | POA: Diagnosis not present

## 2016-12-14 DIAGNOSIS — C44129 Squamous cell carcinoma of skin of left eyelid, including canthus: Secondary | ICD-10-CM | POA: Diagnosis not present

## 2016-12-14 DIAGNOSIS — C44329 Squamous cell carcinoma of skin of other parts of face: Secondary | ICD-10-CM | POA: Diagnosis not present

## 2016-12-14 DIAGNOSIS — L570 Actinic keratosis: Secondary | ICD-10-CM | POA: Diagnosis not present

## 2016-12-14 DIAGNOSIS — Z85828 Personal history of other malignant neoplasm of skin: Secondary | ICD-10-CM | POA: Diagnosis not present

## 2017-01-04 DIAGNOSIS — L578 Other skin changes due to chronic exposure to nonionizing radiation: Secondary | ICD-10-CM | POA: Diagnosis not present

## 2017-01-04 DIAGNOSIS — L570 Actinic keratosis: Secondary | ICD-10-CM | POA: Diagnosis not present

## 2017-01-04 DIAGNOSIS — D2271 Melanocytic nevi of right lower limb, including hip: Secondary | ICD-10-CM | POA: Diagnosis not present

## 2017-01-04 DIAGNOSIS — C44329 Squamous cell carcinoma of skin of other parts of face: Secondary | ICD-10-CM | POA: Diagnosis not present

## 2017-01-04 DIAGNOSIS — L905 Scar conditions and fibrosis of skin: Secondary | ICD-10-CM | POA: Diagnosis not present

## 2017-01-04 DIAGNOSIS — D1801 Hemangioma of skin and subcutaneous tissue: Secondary | ICD-10-CM | POA: Diagnosis not present

## 2017-01-04 DIAGNOSIS — L812 Freckles: Secondary | ICD-10-CM | POA: Diagnosis not present

## 2017-01-04 DIAGNOSIS — L821 Other seborrheic keratosis: Secondary | ICD-10-CM | POA: Diagnosis not present

## 2017-01-04 DIAGNOSIS — Z85828 Personal history of other malignant neoplasm of skin: Secondary | ICD-10-CM | POA: Diagnosis not present

## 2017-01-13 DIAGNOSIS — C44329 Squamous cell carcinoma of skin of other parts of face: Secondary | ICD-10-CM | POA: Diagnosis not present

## 2017-01-29 DIAGNOSIS — Z7901 Long term (current) use of anticoagulants: Secondary | ICD-10-CM | POA: Diagnosis not present

## 2017-01-30 DIAGNOSIS — L570 Actinic keratosis: Secondary | ICD-10-CM | POA: Diagnosis not present

## 2017-01-30 DIAGNOSIS — L905 Scar conditions and fibrosis of skin: Secondary | ICD-10-CM | POA: Diagnosis not present

## 2017-01-30 DIAGNOSIS — L820 Inflamed seborrheic keratosis: Secondary | ICD-10-CM | POA: Diagnosis not present

## 2017-01-30 DIAGNOSIS — Z85828 Personal history of other malignant neoplasm of skin: Secondary | ICD-10-CM | POA: Diagnosis not present

## 2017-03-05 DIAGNOSIS — L570 Actinic keratosis: Secondary | ICD-10-CM | POA: Diagnosis not present

## 2017-03-07 DIAGNOSIS — L570 Actinic keratosis: Secondary | ICD-10-CM | POA: Diagnosis not present

## 2017-03-07 DIAGNOSIS — D485 Neoplasm of uncertain behavior of skin: Secondary | ICD-10-CM | POA: Diagnosis not present

## 2017-03-07 DIAGNOSIS — L708 Other acne: Secondary | ICD-10-CM | POA: Diagnosis not present

## 2017-03-07 DIAGNOSIS — L820 Inflamed seborrheic keratosis: Secondary | ICD-10-CM | POA: Diagnosis not present

## 2017-03-07 DIAGNOSIS — Z85828 Personal history of other malignant neoplasm of skin: Secondary | ICD-10-CM | POA: Diagnosis not present

## 2017-03-07 DIAGNOSIS — L905 Scar conditions and fibrosis of skin: Secondary | ICD-10-CM | POA: Diagnosis not present

## 2017-03-07 DIAGNOSIS — C44529 Squamous cell carcinoma of skin of other part of trunk: Secondary | ICD-10-CM | POA: Diagnosis not present

## 2017-03-12 DIAGNOSIS — L570 Actinic keratosis: Secondary | ICD-10-CM | POA: Diagnosis not present

## 2017-03-20 DIAGNOSIS — L905 Scar conditions and fibrosis of skin: Secondary | ICD-10-CM | POA: Diagnosis not present

## 2017-03-20 DIAGNOSIS — L578 Other skin changes due to chronic exposure to nonionizing radiation: Secondary | ICD-10-CM | POA: Diagnosis not present

## 2017-03-20 DIAGNOSIS — L821 Other seborrheic keratosis: Secondary | ICD-10-CM | POA: Diagnosis not present

## 2017-03-20 DIAGNOSIS — C44529 Squamous cell carcinoma of skin of other part of trunk: Secondary | ICD-10-CM | POA: Diagnosis not present

## 2017-03-20 DIAGNOSIS — Z85828 Personal history of other malignant neoplasm of skin: Secondary | ICD-10-CM | POA: Diagnosis not present

## 2017-04-10 DIAGNOSIS — Z85828 Personal history of other malignant neoplasm of skin: Secondary | ICD-10-CM | POA: Diagnosis not present

## 2017-04-10 DIAGNOSIS — D2261 Melanocytic nevi of right upper limb, including shoulder: Secondary | ICD-10-CM | POA: Diagnosis not present

## 2017-04-10 DIAGNOSIS — D225 Melanocytic nevi of trunk: Secondary | ICD-10-CM | POA: Diagnosis not present

## 2017-04-10 DIAGNOSIS — D485 Neoplasm of uncertain behavior of skin: Secondary | ICD-10-CM | POA: Diagnosis not present

## 2017-04-10 DIAGNOSIS — D2262 Melanocytic nevi of left upper limb, including shoulder: Secondary | ICD-10-CM | POA: Diagnosis not present

## 2017-04-10 DIAGNOSIS — L821 Other seborrheic keratosis: Secondary | ICD-10-CM | POA: Diagnosis not present

## 2017-04-10 DIAGNOSIS — L820 Inflamed seborrheic keratosis: Secondary | ICD-10-CM | POA: Diagnosis not present

## 2017-05-01 DIAGNOSIS — Z85828 Personal history of other malignant neoplasm of skin: Secondary | ICD-10-CM | POA: Diagnosis not present

## 2017-05-01 DIAGNOSIS — D485 Neoplasm of uncertain behavior of skin: Secondary | ICD-10-CM | POA: Diagnosis not present

## 2017-05-01 DIAGNOSIS — D2261 Melanocytic nevi of right upper limb, including shoulder: Secondary | ICD-10-CM | POA: Diagnosis not present

## 2017-05-01 DIAGNOSIS — L988 Other specified disorders of the skin and subcutaneous tissue: Secondary | ICD-10-CM | POA: Diagnosis not present

## 2017-05-25 DIAGNOSIS — G4733 Obstructive sleep apnea (adult) (pediatric): Secondary | ICD-10-CM | POA: Diagnosis not present

## 2017-05-25 DIAGNOSIS — I119 Hypertensive heart disease without heart failure: Secondary | ICD-10-CM | POA: Diagnosis not present

## 2017-05-25 DIAGNOSIS — Z7901 Long term (current) use of anticoagulants: Secondary | ICD-10-CM | POA: Diagnosis not present

## 2017-05-25 DIAGNOSIS — I480 Paroxysmal atrial fibrillation: Secondary | ICD-10-CM | POA: Diagnosis not present

## 2017-05-25 DIAGNOSIS — E668 Other obesity: Secondary | ICD-10-CM | POA: Diagnosis not present

## 2017-06-09 DIAGNOSIS — Z7901 Long term (current) use of anticoagulants: Secondary | ICD-10-CM | POA: Diagnosis not present

## 2017-06-12 DIAGNOSIS — Z7901 Long term (current) use of anticoagulants: Secondary | ICD-10-CM | POA: Diagnosis not present

## 2017-06-12 DIAGNOSIS — Z1159 Encounter for screening for other viral diseases: Secondary | ICD-10-CM | POA: Diagnosis not present

## 2017-06-12 DIAGNOSIS — Z125 Encounter for screening for malignant neoplasm of prostate: Secondary | ICD-10-CM | POA: Diagnosis not present

## 2017-06-12 DIAGNOSIS — Z23 Encounter for immunization: Secondary | ICD-10-CM | POA: Diagnosis not present

## 2017-06-12 DIAGNOSIS — Z6831 Body mass index (BMI) 31.0-31.9, adult: Secondary | ICD-10-CM | POA: Diagnosis not present

## 2017-06-12 DIAGNOSIS — G4733 Obstructive sleep apnea (adult) (pediatric): Secondary | ICD-10-CM | POA: Diagnosis not present

## 2017-06-12 DIAGNOSIS — E291 Testicular hypofunction: Secondary | ICD-10-CM | POA: Diagnosis not present

## 2017-06-12 DIAGNOSIS — E669 Obesity, unspecified: Secondary | ICD-10-CM | POA: Diagnosis not present

## 2017-06-12 DIAGNOSIS — J3489 Other specified disorders of nose and nasal sinuses: Secondary | ICD-10-CM | POA: Diagnosis not present

## 2017-06-12 DIAGNOSIS — Z0000 Encounter for general adult medical examination without abnormal findings: Secondary | ICD-10-CM | POA: Diagnosis not present

## 2017-06-12 DIAGNOSIS — M810 Age-related osteoporosis without current pathological fracture: Secondary | ICD-10-CM | POA: Diagnosis not present

## 2017-06-12 DIAGNOSIS — Z1389 Encounter for screening for other disorder: Secondary | ICD-10-CM | POA: Diagnosis not present

## 2017-06-12 DIAGNOSIS — I10 Essential (primary) hypertension: Secondary | ICD-10-CM | POA: Diagnosis not present

## 2017-08-07 DIAGNOSIS — I872 Venous insufficiency (chronic) (peripheral): Secondary | ICD-10-CM | POA: Diagnosis not present

## 2017-08-10 DIAGNOSIS — D225 Melanocytic nevi of trunk: Secondary | ICD-10-CM | POA: Diagnosis not present

## 2017-08-10 DIAGNOSIS — D485 Neoplasm of uncertain behavior of skin: Secondary | ICD-10-CM | POA: Diagnosis not present

## 2017-08-10 DIAGNOSIS — Z85828 Personal history of other malignant neoplasm of skin: Secondary | ICD-10-CM | POA: Diagnosis not present

## 2017-08-10 DIAGNOSIS — L570 Actinic keratosis: Secondary | ICD-10-CM | POA: Diagnosis not present

## 2017-09-30 DIAGNOSIS — Z7901 Long term (current) use of anticoagulants: Secondary | ICD-10-CM | POA: Diagnosis not present

## 2017-09-30 DIAGNOSIS — Z23 Encounter for immunization: Secondary | ICD-10-CM | POA: Diagnosis not present

## 2017-10-01 DIAGNOSIS — Z85828 Personal history of other malignant neoplasm of skin: Secondary | ICD-10-CM | POA: Diagnosis not present

## 2017-10-01 DIAGNOSIS — L814 Other melanin hyperpigmentation: Secondary | ICD-10-CM | POA: Diagnosis not present

## 2017-10-01 DIAGNOSIS — D485 Neoplasm of uncertain behavior of skin: Secondary | ICD-10-CM | POA: Diagnosis not present

## 2017-10-01 DIAGNOSIS — D2261 Melanocytic nevi of right upper limb, including shoulder: Secondary | ICD-10-CM | POA: Diagnosis not present

## 2017-10-01 DIAGNOSIS — C44519 Basal cell carcinoma of skin of other part of trunk: Secondary | ICD-10-CM | POA: Diagnosis not present

## 2017-10-01 DIAGNOSIS — D2262 Melanocytic nevi of left upper limb, including shoulder: Secondary | ICD-10-CM | POA: Diagnosis not present

## 2017-10-01 DIAGNOSIS — C44319 Basal cell carcinoma of skin of other parts of face: Secondary | ICD-10-CM | POA: Diagnosis not present

## 2017-10-01 DIAGNOSIS — D2272 Melanocytic nevi of left lower limb, including hip: Secondary | ICD-10-CM | POA: Diagnosis not present

## 2017-10-01 DIAGNOSIS — D224 Melanocytic nevi of scalp and neck: Secondary | ICD-10-CM | POA: Diagnosis not present

## 2017-10-01 DIAGNOSIS — L821 Other seborrheic keratosis: Secondary | ICD-10-CM | POA: Diagnosis not present

## 2017-10-01 DIAGNOSIS — D2271 Melanocytic nevi of right lower limb, including hip: Secondary | ICD-10-CM | POA: Diagnosis not present

## 2017-10-01 DIAGNOSIS — D225 Melanocytic nevi of trunk: Secondary | ICD-10-CM | POA: Diagnosis not present

## 2017-10-01 DIAGNOSIS — L570 Actinic keratosis: Secondary | ICD-10-CM | POA: Diagnosis not present

## 2017-11-03 DIAGNOSIS — M6530 Trigger finger, unspecified finger: Secondary | ICD-10-CM | POA: Diagnosis not present

## 2017-11-03 DIAGNOSIS — C61 Malignant neoplasm of prostate: Secondary | ICD-10-CM | POA: Diagnosis not present

## 2017-11-03 DIAGNOSIS — Z7901 Long term (current) use of anticoagulants: Secondary | ICD-10-CM | POA: Diagnosis not present

## 2017-11-03 DIAGNOSIS — I10 Essential (primary) hypertension: Secondary | ICD-10-CM | POA: Diagnosis not present

## 2017-11-11 DIAGNOSIS — Z7901 Long term (current) use of anticoagulants: Secondary | ICD-10-CM | POA: Diagnosis not present

## 2017-11-12 DIAGNOSIS — M20012 Mallet finger of left finger(s): Secondary | ICD-10-CM | POA: Diagnosis not present

## 2017-11-16 DIAGNOSIS — M20012 Mallet finger of left finger(s): Secondary | ICD-10-CM | POA: Diagnosis not present

## 2017-11-23 DIAGNOSIS — G4733 Obstructive sleep apnea (adult) (pediatric): Secondary | ICD-10-CM | POA: Diagnosis not present

## 2017-11-23 DIAGNOSIS — Z7901 Long term (current) use of anticoagulants: Secondary | ICD-10-CM | POA: Diagnosis not present

## 2017-11-23 DIAGNOSIS — G8929 Other chronic pain: Secondary | ICD-10-CM | POA: Diagnosis not present

## 2017-11-23 DIAGNOSIS — I480 Paroxysmal atrial fibrillation: Secondary | ICD-10-CM | POA: Diagnosis not present

## 2017-11-23 DIAGNOSIS — I119 Hypertensive heart disease without heart failure: Secondary | ICD-10-CM | POA: Diagnosis not present

## 2017-11-23 DIAGNOSIS — M79644 Pain in right finger(s): Secondary | ICD-10-CM | POA: Diagnosis not present

## 2017-11-23 DIAGNOSIS — E668 Other obesity: Secondary | ICD-10-CM | POA: Diagnosis not present

## 2017-11-23 DIAGNOSIS — M545 Low back pain: Secondary | ICD-10-CM | POA: Diagnosis not present

## 2017-12-14 DIAGNOSIS — M810 Age-related osteoporosis without current pathological fracture: Secondary | ICD-10-CM | POA: Diagnosis not present

## 2017-12-14 DIAGNOSIS — Z8546 Personal history of malignant neoplasm of prostate: Secondary | ICD-10-CM | POA: Diagnosis not present

## 2017-12-14 DIAGNOSIS — I4891 Unspecified atrial fibrillation: Secondary | ICD-10-CM | POA: Diagnosis not present

## 2017-12-14 DIAGNOSIS — I10 Essential (primary) hypertension: Secondary | ICD-10-CM | POA: Diagnosis not present

## 2017-12-17 DIAGNOSIS — M1811 Unilateral primary osteoarthritis of first carpometacarpal joint, right hand: Secondary | ICD-10-CM | POA: Diagnosis not present

## 2017-12-17 DIAGNOSIS — M20012 Mallet finger of left finger(s): Secondary | ICD-10-CM | POA: Diagnosis not present

## 2018-01-04 DIAGNOSIS — M25475 Effusion, left foot: Secondary | ICD-10-CM | POA: Diagnosis not present

## 2018-01-04 DIAGNOSIS — M79671 Pain in right foot: Secondary | ICD-10-CM | POA: Diagnosis not present

## 2018-01-04 DIAGNOSIS — M25571 Pain in right ankle and joints of right foot: Secondary | ICD-10-CM | POA: Diagnosis not present

## 2018-01-04 DIAGNOSIS — G603 Idiopathic progressive neuropathy: Secondary | ICD-10-CM | POA: Diagnosis not present

## 2018-01-04 DIAGNOSIS — I739 Peripheral vascular disease, unspecified: Secondary | ICD-10-CM | POA: Diagnosis not present

## 2018-01-04 DIAGNOSIS — M25474 Effusion, right foot: Secondary | ICD-10-CM | POA: Diagnosis not present

## 2018-01-14 DIAGNOSIS — M20012 Mallet finger of left finger(s): Secondary | ICD-10-CM | POA: Diagnosis not present

## 2018-01-17 DIAGNOSIS — Z139 Encounter for screening, unspecified: Secondary | ICD-10-CM | POA: Diagnosis not present

## 2018-01-17 DIAGNOSIS — M159 Polyosteoarthritis, unspecified: Secondary | ICD-10-CM | POA: Diagnosis not present

## 2018-01-17 DIAGNOSIS — N390 Urinary tract infection, site not specified: Secondary | ICD-10-CM | POA: Diagnosis not present

## 2018-01-17 DIAGNOSIS — E782 Mixed hyperlipidemia: Secondary | ICD-10-CM | POA: Diagnosis not present

## 2018-01-17 DIAGNOSIS — I480 Paroxysmal atrial fibrillation: Secondary | ICD-10-CM | POA: Diagnosis not present

## 2018-01-17 DIAGNOSIS — I10 Essential (primary) hypertension: Secondary | ICD-10-CM | POA: Diagnosis not present

## 2018-01-17 DIAGNOSIS — I739 Peripheral vascular disease, unspecified: Secondary | ICD-10-CM | POA: Diagnosis not present

## 2018-01-17 DIAGNOSIS — E291 Testicular hypofunction: Secondary | ICD-10-CM | POA: Diagnosis not present

## 2018-01-17 DIAGNOSIS — Z1211 Encounter for screening for malignant neoplasm of colon: Secondary | ICD-10-CM | POA: Diagnosis not present

## 2018-01-17 DIAGNOSIS — E538 Deficiency of other specified B group vitamins: Secondary | ICD-10-CM | POA: Diagnosis not present

## 2018-01-17 DIAGNOSIS — Z1389 Encounter for screening for other disorder: Secondary | ICD-10-CM | POA: Diagnosis not present

## 2018-01-17 DIAGNOSIS — E559 Vitamin D deficiency, unspecified: Secondary | ICD-10-CM | POA: Diagnosis not present

## 2018-01-23 DIAGNOSIS — I482 Chronic atrial fibrillation: Secondary | ICD-10-CM | POA: Diagnosis not present

## 2018-01-23 DIAGNOSIS — Z7901 Long term (current) use of anticoagulants: Secondary | ICD-10-CM | POA: Diagnosis not present

## 2018-01-25 DIAGNOSIS — N390 Urinary tract infection, site not specified: Secondary | ICD-10-CM | POA: Diagnosis not present

## 2018-01-25 DIAGNOSIS — E559 Vitamin D deficiency, unspecified: Secondary | ICD-10-CM | POA: Diagnosis not present

## 2018-01-25 DIAGNOSIS — E291 Testicular hypofunction: Secondary | ICD-10-CM | POA: Diagnosis not present

## 2018-01-25 DIAGNOSIS — E538 Deficiency of other specified B group vitamins: Secondary | ICD-10-CM | POA: Diagnosis not present

## 2018-01-25 DIAGNOSIS — I10 Essential (primary) hypertension: Secondary | ICD-10-CM | POA: Diagnosis not present

## 2018-01-25 DIAGNOSIS — E782 Mixed hyperlipidemia: Secondary | ICD-10-CM | POA: Diagnosis not present

## 2018-01-25 DIAGNOSIS — R5383 Other fatigue: Secondary | ICD-10-CM | POA: Diagnosis not present

## 2018-02-01 DIAGNOSIS — I739 Peripheral vascular disease, unspecified: Secondary | ICD-10-CM | POA: Diagnosis not present

## 2018-02-11 DIAGNOSIS — M20012 Mallet finger of left finger(s): Secondary | ICD-10-CM | POA: Diagnosis not present

## 2018-02-13 DIAGNOSIS — E291 Testicular hypofunction: Secondary | ICD-10-CM | POA: Diagnosis not present

## 2018-02-13 DIAGNOSIS — N390 Urinary tract infection, site not specified: Secondary | ICD-10-CM | POA: Diagnosis not present

## 2018-02-13 DIAGNOSIS — Z139 Encounter for screening, unspecified: Secondary | ICD-10-CM | POA: Diagnosis not present

## 2018-02-13 DIAGNOSIS — E782 Mixed hyperlipidemia: Secondary | ICD-10-CM | POA: Diagnosis not present

## 2018-02-13 DIAGNOSIS — I10 Essential (primary) hypertension: Secondary | ICD-10-CM | POA: Diagnosis not present

## 2018-02-13 DIAGNOSIS — R5383 Other fatigue: Secondary | ICD-10-CM | POA: Diagnosis not present

## 2018-02-13 DIAGNOSIS — E538 Deficiency of other specified B group vitamins: Secondary | ICD-10-CM | POA: Diagnosis not present

## 2018-02-13 DIAGNOSIS — I739 Peripheral vascular disease, unspecified: Secondary | ICD-10-CM | POA: Diagnosis not present

## 2018-02-13 DIAGNOSIS — E559 Vitamin D deficiency, unspecified: Secondary | ICD-10-CM | POA: Diagnosis not present

## 2018-02-13 DIAGNOSIS — Z1211 Encounter for screening for malignant neoplasm of colon: Secondary | ICD-10-CM | POA: Diagnosis not present

## 2018-02-13 DIAGNOSIS — Z1389 Encounter for screening for other disorder: Secondary | ICD-10-CM | POA: Diagnosis not present

## 2018-02-19 DIAGNOSIS — H2513 Age-related nuclear cataract, bilateral: Secondary | ICD-10-CM | POA: Diagnosis not present

## 2018-03-13 DIAGNOSIS — N390 Urinary tract infection, site not specified: Secondary | ICD-10-CM | POA: Diagnosis not present

## 2018-03-15 DIAGNOSIS — E782 Mixed hyperlipidemia: Secondary | ICD-10-CM | POA: Diagnosis not present

## 2018-03-15 DIAGNOSIS — R5383 Other fatigue: Secondary | ICD-10-CM | POA: Diagnosis not present

## 2018-03-15 DIAGNOSIS — N390 Urinary tract infection, site not specified: Secondary | ICD-10-CM | POA: Diagnosis not present

## 2018-03-15 DIAGNOSIS — N342 Other urethritis: Secondary | ICD-10-CM | POA: Diagnosis not present

## 2018-03-15 DIAGNOSIS — Z1211 Encounter for screening for malignant neoplasm of colon: Secondary | ICD-10-CM | POA: Diagnosis not present

## 2018-03-15 DIAGNOSIS — I10 Essential (primary) hypertension: Secondary | ICD-10-CM | POA: Diagnosis not present

## 2018-03-15 DIAGNOSIS — Z139 Encounter for screening, unspecified: Secondary | ICD-10-CM | POA: Diagnosis not present

## 2018-03-15 DIAGNOSIS — E559 Vitamin D deficiency, unspecified: Secondary | ICD-10-CM | POA: Diagnosis not present

## 2018-03-15 DIAGNOSIS — Z1389 Encounter for screening for other disorder: Secondary | ICD-10-CM | POA: Diagnosis not present

## 2018-04-04 DIAGNOSIS — G9009 Other idiopathic peripheral autonomic neuropathy: Secondary | ICD-10-CM | POA: Diagnosis not present

## 2018-04-04 DIAGNOSIS — N281 Cyst of kidney, acquired: Secondary | ICD-10-CM | POA: Diagnosis not present

## 2018-04-04 DIAGNOSIS — C61 Malignant neoplasm of prostate: Secondary | ICD-10-CM | POA: Diagnosis not present

## 2018-04-04 DIAGNOSIS — N5201 Erectile dysfunction due to arterial insufficiency: Secondary | ICD-10-CM | POA: Diagnosis not present

## 2018-04-09 DIAGNOSIS — D225 Melanocytic nevi of trunk: Secondary | ICD-10-CM | POA: Diagnosis not present

## 2018-04-09 DIAGNOSIS — L821 Other seborrheic keratosis: Secondary | ICD-10-CM | POA: Diagnosis not present

## 2018-04-09 DIAGNOSIS — C44319 Basal cell carcinoma of skin of other parts of face: Secondary | ICD-10-CM | POA: Diagnosis not present

## 2018-04-09 DIAGNOSIS — D2262 Melanocytic nevi of left upper limb, including shoulder: Secondary | ICD-10-CM | POA: Diagnosis not present

## 2018-04-09 DIAGNOSIS — C4442 Squamous cell carcinoma of skin of scalp and neck: Secondary | ICD-10-CM | POA: Diagnosis not present

## 2018-04-09 DIAGNOSIS — Z85828 Personal history of other malignant neoplasm of skin: Secondary | ICD-10-CM | POA: Diagnosis not present

## 2018-04-09 DIAGNOSIS — L570 Actinic keratosis: Secondary | ICD-10-CM | POA: Diagnosis not present

## 2018-04-11 DIAGNOSIS — I4891 Unspecified atrial fibrillation: Secondary | ICD-10-CM | POA: Diagnosis not present

## 2018-04-11 DIAGNOSIS — E669 Obesity, unspecified: Secondary | ICD-10-CM | POA: Diagnosis not present

## 2018-04-11 DIAGNOSIS — I700 Atherosclerosis of aorta: Secondary | ICD-10-CM | POA: Diagnosis not present

## 2018-04-11 DIAGNOSIS — M1811 Unilateral primary osteoarthritis of first carpometacarpal joint, right hand: Secondary | ICD-10-CM | POA: Diagnosis not present

## 2018-04-11 DIAGNOSIS — Q613 Polycystic kidney, unspecified: Secondary | ICD-10-CM | POA: Diagnosis not present

## 2018-04-11 DIAGNOSIS — M810 Age-related osteoporosis without current pathological fracture: Secondary | ICD-10-CM | POA: Diagnosis not present

## 2018-04-11 DIAGNOSIS — R32 Unspecified urinary incontinence: Secondary | ICD-10-CM | POA: Diagnosis not present

## 2018-05-03 DIAGNOSIS — C4442 Squamous cell carcinoma of skin of scalp and neck: Secondary | ICD-10-CM | POA: Diagnosis not present

## 2018-05-03 DIAGNOSIS — Z85828 Personal history of other malignant neoplasm of skin: Secondary | ICD-10-CM | POA: Diagnosis not present

## 2018-05-08 DIAGNOSIS — C44319 Basal cell carcinoma of skin of other parts of face: Secondary | ICD-10-CM | POA: Diagnosis not present

## 2018-05-08 DIAGNOSIS — Z85828 Personal history of other malignant neoplasm of skin: Secondary | ICD-10-CM | POA: Diagnosis not present

## 2018-05-11 DIAGNOSIS — I482 Chronic atrial fibrillation: Secondary | ICD-10-CM | POA: Diagnosis not present

## 2018-05-11 DIAGNOSIS — Z7901 Long term (current) use of anticoagulants: Secondary | ICD-10-CM | POA: Diagnosis not present

## 2018-05-16 DIAGNOSIS — I70203 Unspecified atherosclerosis of native arteries of extremities, bilateral legs: Secondary | ICD-10-CM | POA: Diagnosis not present

## 2018-05-16 DIAGNOSIS — G4733 Obstructive sleep apnea (adult) (pediatric): Secondary | ICD-10-CM | POA: Diagnosis not present

## 2018-05-16 DIAGNOSIS — E785 Hyperlipidemia, unspecified: Secondary | ICD-10-CM | POA: Diagnosis not present

## 2018-05-16 DIAGNOSIS — Z7901 Long term (current) use of anticoagulants: Secondary | ICD-10-CM | POA: Diagnosis not present

## 2018-05-16 DIAGNOSIS — E663 Overweight: Secondary | ICD-10-CM | POA: Diagnosis not present

## 2018-05-16 DIAGNOSIS — I119 Hypertensive heart disease without heart failure: Secondary | ICD-10-CM | POA: Diagnosis not present

## 2018-05-16 DIAGNOSIS — I480 Paroxysmal atrial fibrillation: Secondary | ICD-10-CM | POA: Diagnosis not present

## 2018-06-24 DIAGNOSIS — Z85828 Personal history of other malignant neoplasm of skin: Secondary | ICD-10-CM | POA: Diagnosis not present

## 2018-06-24 DIAGNOSIS — C4442 Squamous cell carcinoma of skin of scalp and neck: Secondary | ICD-10-CM | POA: Diagnosis not present

## 2018-06-24 DIAGNOSIS — L570 Actinic keratosis: Secondary | ICD-10-CM | POA: Diagnosis not present

## 2018-07-18 DIAGNOSIS — Z0000 Encounter for general adult medical examination without abnormal findings: Secondary | ICD-10-CM | POA: Diagnosis not present

## 2018-07-18 DIAGNOSIS — E538 Deficiency of other specified B group vitamins: Secondary | ICD-10-CM | POA: Diagnosis not present

## 2018-07-18 DIAGNOSIS — Z86718 Personal history of other venous thrombosis and embolism: Secondary | ICD-10-CM | POA: Diagnosis not present

## 2018-07-18 DIAGNOSIS — Z7901 Long term (current) use of anticoagulants: Secondary | ICD-10-CM | POA: Diagnosis not present

## 2018-07-18 DIAGNOSIS — I10 Essential (primary) hypertension: Secondary | ICD-10-CM | POA: Diagnosis not present

## 2018-07-18 DIAGNOSIS — M48061 Spinal stenosis, lumbar region without neurogenic claudication: Secondary | ICD-10-CM | POA: Diagnosis not present

## 2018-07-18 DIAGNOSIS — Z8546 Personal history of malignant neoplasm of prostate: Secondary | ICD-10-CM | POA: Diagnosis not present

## 2018-07-18 DIAGNOSIS — Z79899 Other long term (current) drug therapy: Secondary | ICD-10-CM | POA: Diagnosis not present

## 2018-07-18 DIAGNOSIS — I700 Atherosclerosis of aorta: Secondary | ICD-10-CM | POA: Diagnosis not present

## 2018-07-18 DIAGNOSIS — G4733 Obstructive sleep apnea (adult) (pediatric): Secondary | ICD-10-CM | POA: Diagnosis not present

## 2018-07-18 DIAGNOSIS — Z1389 Encounter for screening for other disorder: Secondary | ICD-10-CM | POA: Diagnosis not present

## 2018-07-18 DIAGNOSIS — I4891 Unspecified atrial fibrillation: Secondary | ICD-10-CM | POA: Diagnosis not present

## 2018-08-21 DIAGNOSIS — Z7901 Long term (current) use of anticoagulants: Secondary | ICD-10-CM | POA: Diagnosis not present

## 2018-08-21 DIAGNOSIS — I482 Chronic atrial fibrillation: Secondary | ICD-10-CM | POA: Diagnosis not present

## 2018-09-09 DIAGNOSIS — E785 Hyperlipidemia, unspecified: Secondary | ICD-10-CM

## 2018-09-09 DIAGNOSIS — Z23 Encounter for immunization: Secondary | ICD-10-CM | POA: Diagnosis not present

## 2018-09-09 HISTORY — DX: Hyperlipidemia, unspecified: E78.5

## 2018-09-10 DIAGNOSIS — L570 Actinic keratosis: Secondary | ICD-10-CM | POA: Diagnosis not present

## 2018-09-10 DIAGNOSIS — L821 Other seborrheic keratosis: Secondary | ICD-10-CM | POA: Diagnosis not present

## 2018-09-10 DIAGNOSIS — D225 Melanocytic nevi of trunk: Secondary | ICD-10-CM | POA: Diagnosis not present

## 2018-09-10 DIAGNOSIS — Z85828 Personal history of other malignant neoplasm of skin: Secondary | ICD-10-CM | POA: Diagnosis not present

## 2018-09-10 DIAGNOSIS — D485 Neoplasm of uncertain behavior of skin: Secondary | ICD-10-CM | POA: Diagnosis not present

## 2018-09-10 DIAGNOSIS — D1801 Hemangioma of skin and subcutaneous tissue: Secondary | ICD-10-CM | POA: Diagnosis not present

## 2018-09-10 DIAGNOSIS — L720 Epidermal cyst: Secondary | ICD-10-CM | POA: Diagnosis not present

## 2018-09-10 DIAGNOSIS — D2262 Melanocytic nevi of left upper limb, including shoulder: Secondary | ICD-10-CM | POA: Diagnosis not present

## 2018-09-17 DIAGNOSIS — Z79899 Other long term (current) drug therapy: Secondary | ICD-10-CM | POA: Insufficient documentation

## 2018-09-17 NOTE — Progress Notes (Signed)
Cardiology Office Note:    Date:  09/18/2018   ID:  [name], DOB [birth year], MRN [id number]  PCP:  Lavone Orn, MD  Cardiologist:  Shirlee More, MD    Referring MD: Lavone Orn, MD    ASSESSMENT:    1. Paroxysmal atrial fibrillation (HCC)   2. Hypertensive heart disease without CHF   3. Sleep apnea in adult   4. Chronic anticoagulation   5. High risk medication use   6. Encounter for monitoring flecainide therapy    PLAN:    In order of problems listed above:  1. Stable he is maintaining sinus rhythm on flecainide we will continue his current treatment check flecainide level follow-up in 3 months Dr. Wynonia Lawman.  He is anticoagulated long-term with warfarin venous thromboembolism and stopped his calcium channel blocker because of symptomatic hypotension 2. Stable continue his current antihypertensives ACE inhibitor diuretic 3. Stable 4. Continue warfarin managed by his PCP goal INR 2.5 5. Stable no evidence of toxicity check flecainide level   Next appointment: 3 months   Medication Adjustments/Labs and Tests Ordered: Current medicines are reviewed at length with the patient today.  Concerns regarding medicines are outlined above.  Orders Placed This Encounter  Procedures  . Flecainide level  . EKG 12-Lead   Meds ordered this encounter  Medications  . flecainide (TAMBOCOR) 100 MG tablet    Sig: Take 1 tablet (100 mg total) by mouth every 12 (twelve) hours.    Dispense:  180 tablet    Refill:  2    Chief Complaint  Patient presents with  . Follow-up  . Atrial Fibrillation    History of Present Illness:    [name] is a 67 y.o. male with a hx of prostate cancer recurrent venous thromboembolism on warfarin hypertension obstructive sleep apnea on CPAP and lower extremity PAD  who was admitted to [facility] 09/09/2016 with atrial fibrillation.  He was converted to sinus rhythm and placed on flecainide and diltiazem.  Echocardiogram  during the admission showed normal left ventricular size and function left atrium is moderately to severely dilated and no significant valvular abnormality.  He was last seen at hospital discharge 09/10/2018 Compliance with diet, lifestyle and medications: Yes When he first came up in the hospital he is symptomatic hypotension stop his calcium channel blocker and resolved since then he has had no palpitation chest pain shortness of breath he checks his heart rhythm at home its regular and is pleased with the quality of his life.  He is compliant with CPAP for sleep apnea Past Medical History:  Diagnosis Date  . Arthritis    arthritis -back  . Atrial fibrillation ([place])    a. diagnosed 09/2016 - started on Cardizem CD and Flecainide. Continue Coumadin for anticoagulation.  . Cancer [facility])    cancer Prostate- surgery only  . Chronic anticoagulation 09/10/2016  . History of DVT (deep vein thrombosis)   . History of DVT of lower extremity    left leg has some residual crculation issues  . History of prostate cancer   . Hyperlipidemia 09/09/2018  . Hypertension   . Hypertensive heart disease without CHF 09/29/2016  . Obesity (BMI 30-39.9)   . Paroxysmal atrial fibrillation (HCC)    CHA2DS2VASC score 2  . Sleep apnea    a. uses CPAP  . Sleep apnea in adult     Past Surgical History:  Procedure Laterality Date  . BALLOON DILATION N/A 10/12/2015   Procedure: BALLOON DILATION;  Surgeon:  Garlan Fair, MD;  Location: [facility];  Service: Endoscopy;  Laterality: N/A;  . COLONOSCOPY W/ POLYPECTOMY     x2 colon polyps found  . COLONOSCOPY WITH PROPOFOL N/A 10/12/2015   Procedure: COLONOSCOPY WITH PROPOFOL;  Surgeon: Garlan Fair, MD;  Location: [facility];  Service: Endoscopy;  Laterality: N/A;  . ESOPHAGOGASTRODUODENOSCOPY (EGD) WITH PROPOFOL N/A 10/12/2015   Procedure: ESOPHAGOGASTRODUODENOSCOPY (EGD) WITH PROPOFOL;  Surgeon: Garlan Fair, MD;  Location: [facility];  Service:  Endoscopy;  Laterality: N/A;  . SHOULDER ARTHROTOMY     x2 procedures- 1 open, 1 scope.  . TONSILLECTOMY    . TRANSURETHRAL RESECTION OF PROSTATE      Current Medications: Current Meds  Medication Sig  . atorvastatin (LIPITOR) 10 MG tablet Take 10 mg by mouth daily.  . calcium-vitamin D (OSCAL WITH D) 500-200 MG-UNIT per tablet Take 1 tablet by mouth 2 (two) times daily with a meal.   . cholecalciferol (VITAMIN D) 1000 UNITS tablet Take 1,000 Units by mouth 2 (two) times daily with a meal.  . Coenzyme Q10 (COQ-10) 200 MG CAPS Take 1 capsule by mouth daily.  . cyclobenzaprine (FLEXERIL) 10 MG tablet Take 10 mg by mouth 3 (three) times daily as needed for muscle spasms.  . flecainide (TAMBOCOR) 100 MG tablet Take 1 tablet (100 mg total) by mouth every 12 (twelve) hours.  Marland Kitchen FOLIC ACID PO Take 6,314 mcg by mouth daily.  Marland Kitchen lisinopril-hydrochlorothiazide (PRINZIDE,ZESTORETIC) 20-12.5 MG tablet Take 1 tablet by mouth daily.  . potassium chloride SA (K-DUR,KLOR-CON) 20 MEQ tablet Take 20 mEq by mouth daily with lunch.   . Testosterone 20.25 MG/ACT (1.62%) GEL 3 Pump daily.  . vitamin B-12 (CYANOCOBALAMIN) 1000 MCG tablet Take 1,000 mcg by mouth daily.  Marland Kitchen warfarin (COUMADIN) 5 MG tablet Take 5-7.5 mg by mouth See admin instructions. Takes 5 mg on Monday and Friday and 7.5 mg the rest of the week  . [DISCONTINUED] flecainide (TAMBOCOR) 100 MG tablet Take 1 tablet (100 mg total) by mouth every 12 (twelve) hours.     Allergies:   Scallops [shellfish allergy]   Social History   Socioeconomic History  . Marital status: Married    Spouse name: Not on file  . Number of children: Not on file  . Years of education: Not on file  . Highest education level: Not on file  Occupational History  . Not on file  Social Needs  . Financial resource strain: Not on file  . Food insecurity:    Worry: Not on file    Inability: Not on file  . Transportation needs:    Medical: Not on file    Non-medical:  Not on file  Tobacco Use  . Smoking status: Former Smoker    Years: 15.00    Types: Cigarettes    Last attempt to quit: 10/08/2011    Years since quitting: 6.9  . Smokeless tobacco: Never Used  Substance and Sexual Activity  . Alcohol use: No  . Drug use: No  . Sexual activity: Not on file  Lifestyle  . Physical activity:    Days per week: Not on file    Minutes per session: Not on file  . Stress: Not on file  Relationships  . Social connections:    Talks on phone: Not on file    Gets together: Not on file    Attends religious service: Not on file    Active member of club or organization: Not on file  Attends meetings of clubs or organizations: Not on file    Relationship status: Not on file  Other Topics Concern  . Not on file  Social History Narrative  . Not on file     Family History: The patient's family history includes Hypertension in his brother and sister; Prostate cancer in his brother. ROS:   Please see the history of present illness.    All other systems reviewed and are negative.  EKGs/Labs/Other Studies Reviewed:    The following studies were reviewed today:  EKG:  EKG ordered today.  The ekg ordered today demonstrates sinus rhythm normal  Recent Labs: No results found for requested labs within last 8760 hours.  Recent Lipid Panel    Component Value Date/Time   CHOL 169 09/09/2016 0421   TRIG 77 09/09/2016 0421   HDL 49 09/09/2016 0421   CHOLHDL 3.4 09/09/2016 0421   VLDL 15 09/09/2016 0421   LDLCALC 105 (H) 09/09/2016 0421    Physical Exam:    VS:  BP 104/64 (BP Location: Right Arm, Patient Position: Sitting, Cuff Size: Large)   Pulse 61   Ht 6' (1.829 m)   Wt 219 lb 6.4 oz (99.5 kg)   SpO2 96%   BMI 29.76 kg/m     Wt Readings from Last 3 Encounters:  09/18/18 219 lb 6.4 oz (99.5 kg)  09/10/16 255 lb 12.8 oz (116 kg)  10/12/15 250 lb (113.4 kg)     GEN:  Well nourished, well developed in no acute distress HEENT: Normal NECK: No  JVD; No carotid bruits LYMPHATICS: No lymphadenopathy CARDIAC: RRR, no murmurs, rubs, gallops RESPIRATORY:  Clear to auscultation without rales, wheezing or rhonchi  ABDOMEN: Soft, non-tender, non-distended MUSCULOSKELETAL:  No edema; No deformity  SKIN: Warm and dry NEUROLOGIC:  Alert and oriented x 3 PSYCHIATRIC:  Normal affect    Signed, Shirlee More, MD  09/18/2018 3:47 PM    [facility]

## 2018-09-18 ENCOUNTER — Encounter: Payer: Self-pay | Admitting: Cardiology

## 2018-09-18 ENCOUNTER — Ambulatory Visit (INDEPENDENT_AMBULATORY_CARE_PROVIDER_SITE_OTHER): Payer: Medicare Other | Admitting: Cardiology

## 2018-09-18 VITALS — BP 104/64 | HR 61 | Ht 72.0 in | Wt 219.4 lb

## 2018-09-18 DIAGNOSIS — I119 Hypertensive heart disease without heart failure: Secondary | ICD-10-CM | POA: Diagnosis not present

## 2018-09-18 DIAGNOSIS — I480 Paroxysmal atrial fibrillation: Secondary | ICD-10-CM | POA: Diagnosis not present

## 2018-09-18 DIAGNOSIS — Z5181 Encounter for therapeutic drug level monitoring: Secondary | ICD-10-CM | POA: Diagnosis not present

## 2018-09-18 DIAGNOSIS — G4730 Sleep apnea, unspecified: Secondary | ICD-10-CM | POA: Diagnosis not present

## 2018-09-18 DIAGNOSIS — Z79899 Other long term (current) drug therapy: Secondary | ICD-10-CM | POA: Diagnosis not present

## 2018-09-18 DIAGNOSIS — Z7901 Long term (current) use of anticoagulants: Secondary | ICD-10-CM | POA: Diagnosis not present

## 2018-09-18 MED ORDER — FLECAINIDE ACETATE 100 MG PO TABS: 100.0000 mg | ORAL_TABLET | Freq: Two times a day (BID) | ORAL | 2 refills | Status: DC

## 2018-09-18 NOTE — Patient Instructions (Signed)
Medication Instructions:  Your physician recommends that you continue on your current medications as directed. Please refer to the Current Medication list given to you today.  If you need a refill on your cardiac medications before your next appointment, please call your pharmacy.   Lab work: Your physician recommends that you return for lab work today: Flecainide level.   If you have labs (blood work) drawn today and your tests are completely normal, you will receive your results only by: Marland Kitchen MyChart Message (if you have MyChart) OR . A paper copy in the mail If you have any lab test that is abnormal or we need to change your treatment, we will call you to review the results.  Testing/Procedures: You had an EKG today.   Follow-Up: At [facility], you and your health needs are our priority.  As part of our continuing mission to provide you with exceptional heart care, we have created designated Provider Care Teams.  These Care Teams include your primary Cardiologist (physician) and Advanced Practice Providers (APPs -  Physician Assistants and Nurse Practitioners) who all work together to provide you with the care you need, when you need it. You will need a follow up appointment in 3 months.  Please call our office 2 months in advance to schedule this appointment.

## 2018-09-24 LAB — FLECAINIDE LEVEL: Flecainide: 0.21 ug/mL (ref 0.20–1.00)

## 2018-10-21 DIAGNOSIS — R5383 Other fatigue: Secondary | ICD-10-CM | POA: Diagnosis not present

## 2018-10-21 DIAGNOSIS — N390 Urinary tract infection, site not specified: Secondary | ICD-10-CM | POA: Diagnosis not present

## 2018-10-21 DIAGNOSIS — Z7251 High risk heterosexual behavior: Secondary | ICD-10-CM | POA: Diagnosis not present

## 2018-10-21 DIAGNOSIS — Z139 Encounter for screening, unspecified: Secondary | ICD-10-CM | POA: Diagnosis not present

## 2018-10-21 DIAGNOSIS — Z1389 Encounter for screening for other disorder: Secondary | ICD-10-CM | POA: Diagnosis not present

## 2018-10-21 DIAGNOSIS — C801 Malignant (primary) neoplasm, unspecified: Secondary | ICD-10-CM | POA: Diagnosis not present

## 2018-10-21 DIAGNOSIS — I10 Essential (primary) hypertension: Secondary | ICD-10-CM | POA: Diagnosis not present

## 2018-10-21 DIAGNOSIS — E782 Mixed hyperlipidemia: Secondary | ICD-10-CM | POA: Diagnosis not present

## 2018-10-21 DIAGNOSIS — E559 Vitamin D deficiency, unspecified: Secondary | ICD-10-CM | POA: Diagnosis not present

## 2018-10-21 DIAGNOSIS — E538 Deficiency of other specified B group vitamins: Secondary | ICD-10-CM | POA: Diagnosis not present

## 2018-10-21 DIAGNOSIS — A421 Abdominal actinomycosis: Secondary | ICD-10-CM | POA: Diagnosis not present

## 2018-10-21 DIAGNOSIS — Z1211 Encounter for screening for malignant neoplasm of colon: Secondary | ICD-10-CM | POA: Diagnosis not present

## 2018-10-22 DIAGNOSIS — Z13818 Encounter for screening for other digestive system disorders: Secondary | ICD-10-CM | POA: Diagnosis not present

## 2018-10-22 DIAGNOSIS — Z7251 High risk heterosexual behavior: Secondary | ICD-10-CM | POA: Diagnosis not present

## 2018-10-22 DIAGNOSIS — B001 Herpesviral vesicular dermatitis: Secondary | ICD-10-CM | POA: Diagnosis not present

## 2018-10-22 DIAGNOSIS — N390 Urinary tract infection, site not specified: Secondary | ICD-10-CM | POA: Diagnosis not present

## 2018-10-22 DIAGNOSIS — B1910 Unspecified viral hepatitis B without hepatic coma: Secondary | ICD-10-CM | POA: Diagnosis not present

## 2018-10-30 ENCOUNTER — Telehealth: Payer: Self-pay | Admitting: Cardiology

## 2018-10-30 MED ORDER — FLECAINIDE ACETATE 100 MG PO TABS: 100.0000 mg | ORAL_TABLET | Freq: Two times a day (BID) | ORAL | 0 refills | Status: DC

## 2018-10-30 NOTE — Telephone Encounter (Signed)
Refill sent for flecainide to [facility] in [place], [place]. Patient states Dr. Bettina Gavia said it was okay to follow up in 6 months versus 3 months. Will double check with Dr. Bettina Gavia and adjust recall if necessary.

## 2018-12-02 DIAGNOSIS — Z86718 Personal history of other venous thrombosis and embolism: Secondary | ICD-10-CM | POA: Diagnosis not present

## 2018-12-20 DIAGNOSIS — I10 Essential (primary) hypertension: Secondary | ICD-10-CM | POA: Diagnosis not present

## 2018-12-20 DIAGNOSIS — E782 Mixed hyperlipidemia: Secondary | ICD-10-CM | POA: Diagnosis not present

## 2018-12-20 DIAGNOSIS — Z1211 Encounter for screening for malignant neoplasm of colon: Secondary | ICD-10-CM | POA: Diagnosis not present

## 2018-12-20 DIAGNOSIS — R5383 Other fatigue: Secondary | ICD-10-CM | POA: Diagnosis not present

## 2018-12-20 DIAGNOSIS — Z139 Encounter for screening, unspecified: Secondary | ICD-10-CM | POA: Diagnosis not present

## 2018-12-20 DIAGNOSIS — N390 Urinary tract infection, site not specified: Secondary | ICD-10-CM | POA: Diagnosis not present

## 2018-12-20 DIAGNOSIS — I480 Paroxysmal atrial fibrillation: Secondary | ICD-10-CM | POA: Diagnosis not present

## 2018-12-20 DIAGNOSIS — M159 Polyosteoarthritis, unspecified: Secondary | ICD-10-CM | POA: Diagnosis not present

## 2018-12-20 DIAGNOSIS — Z1389 Encounter for screening for other disorder: Secondary | ICD-10-CM | POA: Diagnosis not present

## 2018-12-20 DIAGNOSIS — E538 Deficiency of other specified B group vitamins: Secondary | ICD-10-CM | POA: Diagnosis not present

## 2018-12-20 DIAGNOSIS — E559 Vitamin D deficiency, unspecified: Secondary | ICD-10-CM | POA: Diagnosis not present

## 2018-12-20 DIAGNOSIS — Z7251 High risk heterosexual behavior: Secondary | ICD-10-CM | POA: Diagnosis not present

## 2019-01-01 DIAGNOSIS — I480 Paroxysmal atrial fibrillation: Secondary | ICD-10-CM | POA: Diagnosis not present

## 2019-01-01 DIAGNOSIS — Z8249 Family history of ischemic heart disease and other diseases of the circulatory system: Secondary | ICD-10-CM | POA: Diagnosis not present

## 2019-01-01 DIAGNOSIS — I10 Essential (primary) hypertension: Secondary | ICD-10-CM | POA: Diagnosis not present

## 2019-01-01 DIAGNOSIS — E785 Hyperlipidemia, unspecified: Secondary | ICD-10-CM | POA: Diagnosis not present

## 2019-01-02 DIAGNOSIS — R9431 Abnormal electrocardiogram [ECG] [EKG]: Secondary | ICD-10-CM | POA: Diagnosis not present

## 2019-01-02 DIAGNOSIS — R001 Bradycardia, unspecified: Secondary | ICD-10-CM | POA: Diagnosis not present

## 2019-01-02 DIAGNOSIS — I10 Essential (primary) hypertension: Secondary | ICD-10-CM | POA: Diagnosis not present

## 2019-01-16 DIAGNOSIS — R5383 Other fatigue: Secondary | ICD-10-CM | POA: Diagnosis not present

## 2019-01-16 DIAGNOSIS — E538 Deficiency of other specified B group vitamins: Secondary | ICD-10-CM | POA: Diagnosis not present

## 2019-01-16 DIAGNOSIS — E782 Mixed hyperlipidemia: Secondary | ICD-10-CM | POA: Diagnosis not present

## 2019-01-16 DIAGNOSIS — N390 Urinary tract infection, site not specified: Secondary | ICD-10-CM | POA: Diagnosis not present

## 2019-01-16 DIAGNOSIS — I10 Essential (primary) hypertension: Secondary | ICD-10-CM | POA: Diagnosis not present

## 2019-01-16 DIAGNOSIS — E559 Vitamin D deficiency, unspecified: Secondary | ICD-10-CM | POA: Diagnosis not present

## 2019-01-20 DIAGNOSIS — E785 Hyperlipidemia, unspecified: Secondary | ICD-10-CM | POA: Diagnosis not present

## 2019-01-20 DIAGNOSIS — I480 Paroxysmal atrial fibrillation: Secondary | ICD-10-CM | POA: Diagnosis not present

## 2019-01-20 DIAGNOSIS — I10 Essential (primary) hypertension: Secondary | ICD-10-CM | POA: Diagnosis not present

## 2019-01-20 DIAGNOSIS — R9439 Abnormal result of other cardiovascular function study: Secondary | ICD-10-CM | POA: Diagnosis not present

## 2019-01-21 DIAGNOSIS — Z01812 Encounter for preprocedural laboratory examination: Secondary | ICD-10-CM | POA: Diagnosis not present

## 2019-01-21 DIAGNOSIS — Z5181 Encounter for therapeutic drug level monitoring: Secondary | ICD-10-CM | POA: Diagnosis not present

## 2019-01-21 DIAGNOSIS — E785 Hyperlipidemia, unspecified: Secondary | ICD-10-CM | POA: Diagnosis not present

## 2019-01-21 DIAGNOSIS — Z01818 Encounter for other preprocedural examination: Secondary | ICD-10-CM | POA: Diagnosis not present

## 2019-01-21 DIAGNOSIS — I4891 Unspecified atrial fibrillation: Secondary | ICD-10-CM | POA: Diagnosis not present

## 2019-01-21 DIAGNOSIS — I10 Essential (primary) hypertension: Secondary | ICD-10-CM | POA: Diagnosis not present

## 2019-01-27 DIAGNOSIS — E538 Deficiency of other specified B group vitamins: Secondary | ICD-10-CM | POA: Diagnosis not present

## 2019-01-27 DIAGNOSIS — R5383 Other fatigue: Secondary | ICD-10-CM | POA: Diagnosis not present

## 2019-01-27 DIAGNOSIS — Z139 Encounter for screening, unspecified: Secondary | ICD-10-CM | POA: Diagnosis not present

## 2019-01-27 DIAGNOSIS — I10 Essential (primary) hypertension: Secondary | ICD-10-CM | POA: Diagnosis not present

## 2019-01-27 DIAGNOSIS — N390 Urinary tract infection, site not specified: Secondary | ICD-10-CM | POA: Diagnosis not present

## 2019-01-27 DIAGNOSIS — I480 Paroxysmal atrial fibrillation: Secondary | ICD-10-CM | POA: Diagnosis not present

## 2019-01-27 DIAGNOSIS — E559 Vitamin D deficiency, unspecified: Secondary | ICD-10-CM | POA: Diagnosis not present

## 2019-01-27 DIAGNOSIS — Z1211 Encounter for screening for malignant neoplasm of colon: Secondary | ICD-10-CM | POA: Diagnosis not present

## 2019-01-27 DIAGNOSIS — Z1389 Encounter for screening for other disorder: Secondary | ICD-10-CM | POA: Diagnosis not present

## 2019-01-27 DIAGNOSIS — M549 Dorsalgia, unspecified: Secondary | ICD-10-CM | POA: Diagnosis not present

## 2019-01-27 DIAGNOSIS — E782 Mixed hyperlipidemia: Secondary | ICD-10-CM | POA: Diagnosis not present

## 2019-01-29 DIAGNOSIS — I2510 Atherosclerotic heart disease of native coronary artery without angina pectoris: Secondary | ICD-10-CM | POA: Diagnosis not present

## 2019-01-29 DIAGNOSIS — M1990 Unspecified osteoarthritis, unspecified site: Secondary | ICD-10-CM | POA: Diagnosis not present

## 2019-01-29 DIAGNOSIS — R9439 Abnormal result of other cardiovascular function study: Secondary | ICD-10-CM | POA: Diagnosis not present

## 2019-01-29 DIAGNOSIS — Z87891 Personal history of nicotine dependence: Secondary | ICD-10-CM | POA: Diagnosis not present

## 2019-01-29 DIAGNOSIS — I10 Essential (primary) hypertension: Secondary | ICD-10-CM | POA: Diagnosis not present

## 2019-01-29 DIAGNOSIS — E785 Hyperlipidemia, unspecified: Secondary | ICD-10-CM | POA: Diagnosis not present

## 2019-01-29 DIAGNOSIS — Z7901 Long term (current) use of anticoagulants: Secondary | ICD-10-CM | POA: Diagnosis not present

## 2019-01-29 DIAGNOSIS — I480 Paroxysmal atrial fibrillation: Secondary | ICD-10-CM | POA: Diagnosis not present

## 2019-02-03 DIAGNOSIS — E785 Hyperlipidemia, unspecified: Secondary | ICD-10-CM | POA: Diagnosis not present

## 2019-02-03 DIAGNOSIS — I2510 Atherosclerotic heart disease of native coronary artery without angina pectoris: Secondary | ICD-10-CM | POA: Diagnosis not present

## 2019-02-03 DIAGNOSIS — I480 Paroxysmal atrial fibrillation: Secondary | ICD-10-CM | POA: Diagnosis not present

## 2019-02-03 DIAGNOSIS — Z9889 Other specified postprocedural states: Secondary | ICD-10-CM | POA: Diagnosis not present

## 2019-02-03 DIAGNOSIS — I10 Essential (primary) hypertension: Secondary | ICD-10-CM | POA: Diagnosis not present

## 2019-02-06 DIAGNOSIS — M5136 Other intervertebral disc degeneration, lumbar region: Secondary | ICD-10-CM | POA: Diagnosis not present

## 2019-02-24 DIAGNOSIS — M549 Dorsalgia, unspecified: Secondary | ICD-10-CM | POA: Diagnosis not present

## 2019-02-24 DIAGNOSIS — Z1211 Encounter for screening for malignant neoplasm of colon: Secondary | ICD-10-CM | POA: Diagnosis not present

## 2019-02-24 DIAGNOSIS — E538 Deficiency of other specified B group vitamins: Secondary | ICD-10-CM | POA: Diagnosis not present

## 2019-02-24 DIAGNOSIS — Z139 Encounter for screening, unspecified: Secondary | ICD-10-CM | POA: Diagnosis not present

## 2019-02-24 DIAGNOSIS — E559 Vitamin D deficiency, unspecified: Secondary | ICD-10-CM | POA: Diagnosis not present

## 2019-02-24 DIAGNOSIS — Z1389 Encounter for screening for other disorder: Secondary | ICD-10-CM | POA: Diagnosis not present

## 2019-02-24 DIAGNOSIS — I10 Essential (primary) hypertension: Secondary | ICD-10-CM | POA: Diagnosis not present

## 2019-02-24 DIAGNOSIS — E782 Mixed hyperlipidemia: Secondary | ICD-10-CM | POA: Diagnosis not present

## 2019-04-01 DIAGNOSIS — I10 Essential (primary) hypertension: Secondary | ICD-10-CM | POA: Diagnosis not present

## 2019-04-01 DIAGNOSIS — I4891 Unspecified atrial fibrillation: Secondary | ICD-10-CM | POA: Diagnosis not present

## 2019-04-01 DIAGNOSIS — Z7901 Long term (current) use of anticoagulants: Secondary | ICD-10-CM | POA: Diagnosis not present

## 2019-04-01 DIAGNOSIS — G4733 Obstructive sleep apnea (adult) (pediatric): Secondary | ICD-10-CM | POA: Diagnosis not present

## 2019-04-01 DIAGNOSIS — Q613 Polycystic kidney, unspecified: Secondary | ICD-10-CM | POA: Diagnosis not present

## 2019-04-01 DIAGNOSIS — I2510 Atherosclerotic heart disease of native coronary artery without angina pectoris: Secondary | ICD-10-CM | POA: Diagnosis not present

## 2019-04-29 DIAGNOSIS — N281 Cyst of kidney, acquired: Secondary | ICD-10-CM | POA: Diagnosis not present

## 2019-04-29 DIAGNOSIS — C61 Malignant neoplasm of prostate: Secondary | ICD-10-CM | POA: Diagnosis not present

## 2019-04-29 DIAGNOSIS — G9009 Other idiopathic peripheral autonomic neuropathy: Secondary | ICD-10-CM | POA: Diagnosis not present

## 2019-06-02 DIAGNOSIS — L570 Actinic keratosis: Secondary | ICD-10-CM | POA: Diagnosis not present

## 2019-06-02 DIAGNOSIS — L821 Other seborrheic keratosis: Secondary | ICD-10-CM | POA: Diagnosis not present

## 2019-06-02 DIAGNOSIS — D2272 Melanocytic nevi of left lower limb, including hip: Secondary | ICD-10-CM | POA: Diagnosis not present

## 2019-06-02 DIAGNOSIS — D225 Melanocytic nevi of trunk: Secondary | ICD-10-CM | POA: Diagnosis not present

## 2019-06-02 DIAGNOSIS — D2262 Melanocytic nevi of left upper limb, including shoulder: Secondary | ICD-10-CM | POA: Diagnosis not present

## 2019-06-02 DIAGNOSIS — D2261 Melanocytic nevi of right upper limb, including shoulder: Secondary | ICD-10-CM | POA: Diagnosis not present

## 2019-06-02 DIAGNOSIS — D485 Neoplasm of uncertain behavior of skin: Secondary | ICD-10-CM | POA: Diagnosis not present

## 2019-06-02 DIAGNOSIS — D1801 Hemangioma of skin and subcutaneous tissue: Secondary | ICD-10-CM | POA: Diagnosis not present

## 2019-06-02 DIAGNOSIS — L820 Inflamed seborrheic keratosis: Secondary | ICD-10-CM | POA: Diagnosis not present

## 2019-06-02 DIAGNOSIS — Z85828 Personal history of other malignant neoplasm of skin: Secondary | ICD-10-CM | POA: Diagnosis not present

## 2019-06-10 DIAGNOSIS — D485 Neoplasm of uncertain behavior of skin: Secondary | ICD-10-CM | POA: Diagnosis not present

## 2019-06-10 DIAGNOSIS — L988 Other specified disorders of the skin and subcutaneous tissue: Secondary | ICD-10-CM | POA: Diagnosis not present

## 2019-06-10 DIAGNOSIS — Z85828 Personal history of other malignant neoplasm of skin: Secondary | ICD-10-CM | POA: Diagnosis not present

## 2019-06-19 DIAGNOSIS — L720 Epidermal cyst: Secondary | ICD-10-CM | POA: Diagnosis not present

## 2019-06-19 DIAGNOSIS — Z85828 Personal history of other malignant neoplasm of skin: Secondary | ICD-10-CM | POA: Diagnosis not present

## 2019-07-25 DIAGNOSIS — M810 Age-related osteoporosis without current pathological fracture: Secondary | ICD-10-CM | POA: Diagnosis not present

## 2019-07-25 DIAGNOSIS — Z8546 Personal history of malignant neoplasm of prostate: Secondary | ICD-10-CM | POA: Diagnosis not present

## 2019-07-25 DIAGNOSIS — G4733 Obstructive sleep apnea (adult) (pediatric): Secondary | ICD-10-CM | POA: Diagnosis not present

## 2019-07-25 DIAGNOSIS — M48061 Spinal stenosis, lumbar region without neurogenic claudication: Secondary | ICD-10-CM | POA: Diagnosis not present

## 2019-07-25 DIAGNOSIS — I2510 Atherosclerotic heart disease of native coronary artery without angina pectoris: Secondary | ICD-10-CM | POA: Diagnosis not present

## 2019-07-25 DIAGNOSIS — I4891 Unspecified atrial fibrillation: Secondary | ICD-10-CM | POA: Diagnosis not present

## 2019-07-25 DIAGNOSIS — I10 Essential (primary) hypertension: Secondary | ICD-10-CM | POA: Diagnosis not present

## 2019-07-25 DIAGNOSIS — I700 Atherosclerosis of aorta: Secondary | ICD-10-CM | POA: Diagnosis not present

## 2019-07-25 DIAGNOSIS — Z7901 Long term (current) use of anticoagulants: Secondary | ICD-10-CM | POA: Diagnosis not present

## 2019-07-25 DIAGNOSIS — E291 Testicular hypofunction: Secondary | ICD-10-CM | POA: Diagnosis not present

## 2019-07-25 DIAGNOSIS — Z77011 Contact with and (suspected) exposure to lead: Secondary | ICD-10-CM | POA: Diagnosis not present

## 2019-07-25 DIAGNOSIS — Z0000 Encounter for general adult medical examination without abnormal findings: Secondary | ICD-10-CM | POA: Diagnosis not present

## 2019-07-25 DIAGNOSIS — N50812 Left testicular pain: Secondary | ICD-10-CM | POA: Diagnosis not present

## 2019-07-25 DIAGNOSIS — Z86718 Personal history of other venous thrombosis and embolism: Secondary | ICD-10-CM | POA: Diagnosis not present

## 2019-07-25 DIAGNOSIS — Z1389 Encounter for screening for other disorder: Secondary | ICD-10-CM | POA: Diagnosis not present

## 2019-07-29 DIAGNOSIS — N281 Cyst of kidney, acquired: Secondary | ICD-10-CM | POA: Diagnosis not present

## 2019-07-29 DIAGNOSIS — N5201 Erectile dysfunction due to arterial insufficiency: Secondary | ICD-10-CM | POA: Diagnosis not present

## 2019-07-29 DIAGNOSIS — N5082 Scrotal pain: Secondary | ICD-10-CM | POA: Diagnosis not present

## 2019-07-29 DIAGNOSIS — C61 Malignant neoplasm of prostate: Secondary | ICD-10-CM | POA: Diagnosis not present

## 2019-08-21 DIAGNOSIS — N5082 Scrotal pain: Secondary | ICD-10-CM | POA: Diagnosis not present

## 2019-08-21 DIAGNOSIS — N281 Cyst of kidney, acquired: Secondary | ICD-10-CM | POA: Diagnosis not present

## 2019-08-21 DIAGNOSIS — C61 Malignant neoplasm of prostate: Secondary | ICD-10-CM | POA: Diagnosis not present

## 2019-08-21 DIAGNOSIS — N5201 Erectile dysfunction due to arterial insufficiency: Secondary | ICD-10-CM | POA: Diagnosis not present

## 2019-08-27 DIAGNOSIS — Z23 Encounter for immunization: Secondary | ICD-10-CM | POA: Diagnosis not present

## 2019-10-02 DIAGNOSIS — R351 Nocturia: Secondary | ICD-10-CM | POA: Diagnosis not present

## 2019-12-16 DIAGNOSIS — Z85828 Personal history of other malignant neoplasm of skin: Secondary | ICD-10-CM | POA: Diagnosis not present

## 2019-12-16 DIAGNOSIS — D225 Melanocytic nevi of trunk: Secondary | ICD-10-CM | POA: Diagnosis not present

## 2019-12-16 DIAGNOSIS — D485 Neoplasm of uncertain behavior of skin: Secondary | ICD-10-CM | POA: Diagnosis not present

## 2019-12-17 DIAGNOSIS — Z5181 Encounter for therapeutic drug level monitoring: Secondary | ICD-10-CM | POA: Diagnosis not present

## 2019-12-23 ENCOUNTER — Ambulatory Visit: Admit: 2019-12-23 | Discharge: 2019-12-24 | Payer: MEDICARE

## 2019-12-23 DIAGNOSIS — Z23 Encounter for immunization: Secondary | ICD-10-CM | POA: Diagnosis not present

## 2020-01-08 ENCOUNTER — Ambulatory Visit: Payer: Medicare Other

## 2020-01-08 DIAGNOSIS — J019 Acute sinusitis, unspecified: Secondary | ICD-10-CM | POA: Diagnosis not present

## 2020-01-13 ENCOUNTER — Ambulatory Visit: Admit: 2020-01-13 | Discharge: 2020-01-14 | Payer: MEDICARE

## 2020-01-13 DIAGNOSIS — Z23 Encounter for immunization: Secondary | ICD-10-CM | POA: Diagnosis not present

## 2020-01-29 ENCOUNTER — Ambulatory Visit: Payer: Medicare Other

## 2020-02-06 DIAGNOSIS — I10 Essential (primary) hypertension: Secondary | ICD-10-CM | POA: Diagnosis not present

## 2020-02-06 DIAGNOSIS — Z1211 Encounter for screening for malignant neoplasm of colon: Secondary | ICD-10-CM | POA: Diagnosis not present

## 2020-02-06 DIAGNOSIS — Z91018 Allergy to other foods: Secondary | ICD-10-CM | POA: Diagnosis not present

## 2020-02-06 DIAGNOSIS — E538 Deficiency of other specified B group vitamins: Secondary | ICD-10-CM | POA: Diagnosis not present

## 2020-02-06 DIAGNOSIS — M7070 Other bursitis of hip, unspecified hip: Secondary | ICD-10-CM | POA: Diagnosis not present

## 2020-02-06 DIAGNOSIS — Z139 Encounter for screening, unspecified: Secondary | ICD-10-CM | POA: Diagnosis not present

## 2020-02-06 DIAGNOSIS — Z1389 Encounter for screening for other disorder: Secondary | ICD-10-CM | POA: Diagnosis not present

## 2020-02-06 DIAGNOSIS — M159 Polyosteoarthritis, unspecified: Secondary | ICD-10-CM | POA: Diagnosis not present

## 2020-02-06 DIAGNOSIS — E559 Vitamin D deficiency, unspecified: Secondary | ICD-10-CM | POA: Diagnosis not present

## 2020-02-06 DIAGNOSIS — J309 Allergic rhinitis, unspecified: Secondary | ICD-10-CM | POA: Diagnosis not present

## 2020-02-06 DIAGNOSIS — E782 Mixed hyperlipidemia: Secondary | ICD-10-CM | POA: Diagnosis not present

## 2020-02-06 DIAGNOSIS — R5383 Other fatigue: Secondary | ICD-10-CM | POA: Diagnosis not present

## 2020-02-10 DIAGNOSIS — J309 Allergic rhinitis, unspecified: Secondary | ICD-10-CM | POA: Diagnosis not present

## 2020-02-10 DIAGNOSIS — R5383 Other fatigue: Secondary | ICD-10-CM | POA: Diagnosis not present

## 2020-02-10 DIAGNOSIS — I10 Essential (primary) hypertension: Secondary | ICD-10-CM | POA: Diagnosis not present

## 2020-02-10 DIAGNOSIS — E559 Vitamin D deficiency, unspecified: Secondary | ICD-10-CM | POA: Diagnosis not present

## 2020-02-10 DIAGNOSIS — E782 Mixed hyperlipidemia: Secondary | ICD-10-CM | POA: Diagnosis not present

## 2020-02-10 DIAGNOSIS — E538 Deficiency of other specified B group vitamins: Secondary | ICD-10-CM | POA: Diagnosis not present

## 2020-02-10 DIAGNOSIS — Z91018 Allergy to other foods: Secondary | ICD-10-CM | POA: Diagnosis not present

## 2020-02-25 DIAGNOSIS — H2513 Age-related nuclear cataract, bilateral: Secondary | ICD-10-CM | POA: Diagnosis not present

## 2020-03-16 DIAGNOSIS — I10 Essential (primary) hypertension: Secondary | ICD-10-CM | POA: Diagnosis not present

## 2020-03-16 DIAGNOSIS — I2510 Atherosclerotic heart disease of native coronary artery without angina pectoris: Secondary | ICD-10-CM | POA: Diagnosis not present

## 2020-03-16 DIAGNOSIS — I480 Paroxysmal atrial fibrillation: Secondary | ICD-10-CM | POA: Diagnosis not present

## 2020-03-16 DIAGNOSIS — E785 Hyperlipidemia, unspecified: Secondary | ICD-10-CM | POA: Diagnosis not present

## 2020-03-22 DIAGNOSIS — I480 Paroxysmal atrial fibrillation: Secondary | ICD-10-CM | POA: Diagnosis not present

## 2020-03-22 DIAGNOSIS — E782 Mixed hyperlipidemia: Secondary | ICD-10-CM | POA: Diagnosis not present

## 2020-03-22 DIAGNOSIS — M159 Polyosteoarthritis, unspecified: Secondary | ICD-10-CM | POA: Diagnosis not present

## 2020-03-22 DIAGNOSIS — E559 Vitamin D deficiency, unspecified: Secondary | ICD-10-CM | POA: Diagnosis not present

## 2020-03-22 DIAGNOSIS — J309 Allergic rhinitis, unspecified: Secondary | ICD-10-CM | POA: Diagnosis not present

## 2020-03-22 DIAGNOSIS — E291 Testicular hypofunction: Secondary | ICD-10-CM | POA: Diagnosis not present

## 2020-03-22 DIAGNOSIS — I10 Essential (primary) hypertension: Secondary | ICD-10-CM | POA: Diagnosis not present

## 2020-03-22 DIAGNOSIS — Z1211 Encounter for screening for malignant neoplasm of colon: Secondary | ICD-10-CM | POA: Diagnosis not present

## 2020-03-22 DIAGNOSIS — Z1389 Encounter for screening for other disorder: Secondary | ICD-10-CM | POA: Diagnosis not present

## 2020-03-22 DIAGNOSIS — Z139 Encounter for screening, unspecified: Secondary | ICD-10-CM | POA: Diagnosis not present

## 2020-04-02 DIAGNOSIS — Z8546 Personal history of malignant neoplasm of prostate: Secondary | ICD-10-CM | POA: Diagnosis not present

## 2020-04-02 DIAGNOSIS — I10 Essential (primary) hypertension: Secondary | ICD-10-CM | POA: Diagnosis not present

## 2020-04-02 DIAGNOSIS — M810 Age-related osteoporosis without current pathological fracture: Secondary | ICD-10-CM | POA: Diagnosis not present

## 2020-04-02 DIAGNOSIS — I2510 Atherosclerotic heart disease of native coronary artery without angina pectoris: Secondary | ICD-10-CM | POA: Diagnosis not present

## 2020-04-02 DIAGNOSIS — Q613 Polycystic kidney, unspecified: Secondary | ICD-10-CM | POA: Diagnosis not present

## 2020-04-02 DIAGNOSIS — M1811 Unilateral primary osteoarthritis of first carpometacarpal joint, right hand: Secondary | ICD-10-CM | POA: Diagnosis not present

## 2020-04-02 DIAGNOSIS — I4891 Unspecified atrial fibrillation: Secondary | ICD-10-CM | POA: Diagnosis not present

## 2020-04-20 DIAGNOSIS — M7062 Trochanteric bursitis, left hip: Secondary | ICD-10-CM | POA: Diagnosis not present

## 2020-04-20 DIAGNOSIS — M1712 Unilateral primary osteoarthritis, left knee: Secondary | ICD-10-CM | POA: Diagnosis not present

## 2020-04-20 DIAGNOSIS — M7061 Trochanteric bursitis, right hip: Secondary | ICD-10-CM | POA: Diagnosis not present

## 2020-05-10 ENCOUNTER — Other Ambulatory Visit: Payer: Self-pay | Admitting: Internal Medicine

## 2020-05-10 DIAGNOSIS — Z8739 Personal history of other diseases of the musculoskeletal system and connective tissue: Secondary | ICD-10-CM | POA: Diagnosis not present

## 2020-05-10 DIAGNOSIS — M7062 Trochanteric bursitis, left hip: Secondary | ICD-10-CM | POA: Diagnosis not present

## 2020-05-10 DIAGNOSIS — I4891 Unspecified atrial fibrillation: Secondary | ICD-10-CM | POA: Diagnosis not present

## 2020-05-10 DIAGNOSIS — R519 Headache, unspecified: Secondary | ICD-10-CM

## 2020-05-10 DIAGNOSIS — M7061 Trochanteric bursitis, right hip: Secondary | ICD-10-CM | POA: Diagnosis not present

## 2020-05-10 DIAGNOSIS — E291 Testicular hypofunction: Secondary | ICD-10-CM | POA: Diagnosis not present

## 2020-05-10 DIAGNOSIS — R634 Abnormal weight loss: Secondary | ICD-10-CM | POA: Diagnosis not present

## 2020-05-11 ENCOUNTER — Other Ambulatory Visit: Payer: Self-pay | Admitting: Internal Medicine

## 2020-05-11 DIAGNOSIS — M810 Age-related osteoporosis without current pathological fracture: Secondary | ICD-10-CM

## 2020-05-26 ENCOUNTER — Other Ambulatory Visit: Payer: Medicare Other

## 2020-05-26 ENCOUNTER — Ambulatory Visit
Admission: RE | Admit: 2020-05-26 | Discharge: 2020-05-26 | Disposition: A | Discharge status: Home / Self Care | Payer: Medicare Other | Source: Ambulatory Visit | Attending: Internal Medicine | Admitting: Internal Medicine

## 2020-05-26 ENCOUNTER — Other Ambulatory Visit: Payer: Self-pay

## 2020-05-26 DIAGNOSIS — R519 Headache, unspecified: Secondary | ICD-10-CM

## 2020-05-26 MED ORDER — IOPAMIDOL (ISOVUE-370) INJECTION 76%
75.0000 mL | Freq: Once | INTRAVENOUS | Status: AC | PRN
  Administered 2020-05-26: 75 mL via INTRAVENOUS

## 2020-05-27 ENCOUNTER — Ambulatory Visit: Payer: Medicare Other | Attending: Internal Medicine

## 2020-05-27 DIAGNOSIS — G8929 Other chronic pain: Secondary | ICD-10-CM | POA: Diagnosis present

## 2020-05-27 DIAGNOSIS — M4124 Other idiopathic scoliosis, thoracic region: Secondary | ICD-10-CM | POA: Insufficient documentation

## 2020-05-27 DIAGNOSIS — M6281 Muscle weakness (generalized): Secondary | ICD-10-CM | POA: Insufficient documentation

## 2020-05-27 DIAGNOSIS — M25552 Pain in left hip: Secondary | ICD-10-CM | POA: Insufficient documentation

## 2020-05-27 DIAGNOSIS — M25551 Pain in right hip: Secondary | ICD-10-CM | POA: Insufficient documentation

## 2020-05-27 DIAGNOSIS — M7071 Other bursitis of hip, right hip: Secondary | ICD-10-CM | POA: Diagnosis present

## 2020-05-27 DIAGNOSIS — M545 Low back pain: Secondary | ICD-10-CM | POA: Diagnosis present

## 2020-05-27 DIAGNOSIS — M7072 Other bursitis of hip, left hip: Secondary | ICD-10-CM | POA: Diagnosis present

## 2020-05-31 NOTE — Therapy (Signed)
[place], [place], [address] Phone: [phone]   Fax:  [phone]  Physical Therapy Evaluation  Patient Details  Name: [name] MRN: [id number] Date of Birth: 1951/05/02 Referring Provider (PT): Irven Shelling, MD   Encounter Date: 05/27/2020   PT End of Session - 05/31/20 0559    Visit Number 1    Number of Visits 13    Date for PT Re-Evaluation 08/30/20    Authorization Type MEDICARE PART A AND B; AARP    PT Start Time 0830    PT Stop Time 0920    PT Time Calculation (min) 50 min    Activity Tolerance Patient tolerated treatment well    Behavior During Therapy [facility] for tasks assessed/performed           Past Medical History:  Diagnosis Date  . Arthritis    arthritis -back  . Atrial fibrillation ([place])    a. diagnosed 09/2016 - started on Cardizem CD and Flecainide. Continue Coumadin for anticoagulation.  . Cancer [facility])    cancer Prostate- surgery only  . Chronic anticoagulation 09/10/2016  . History of DVT (deep vein thrombosis)   . History of DVT of lower extremity    left leg has some residual crculation issues  . History of prostate cancer   . Hyperlipidemia 09/09/2018  . Hypertension   . Hypertensive heart disease without CHF 09/29/2016  . Obesity (BMI 30-39.9)   . Paroxysmal atrial fibrillation (HCC)    CHA2DS2VASC score 2  . Sleep apnea    a. uses CPAP  . Sleep apnea in adult     Past Surgical History:  Procedure Laterality Date  . BALLOON DILATION N/A 10/12/2015   Procedure: BALLOON DILATION;  Surgeon: Garlan Fair, MD;  Location: [facility];  Service: Endoscopy;  Laterality: N/A;  . COLONOSCOPY W/ POLYPECTOMY     x2 colon polyps found  . COLONOSCOPY WITH PROPOFOL N/A 10/12/2015   Procedure: COLONOSCOPY WITH PROPOFOL;  Surgeon: Garlan Fair, MD;  [facility];  Service: Endoscopy;  Laterality: N/A;  . ESOPHAGOGASTRODUODENOSCOPY (EGD) WITH  PROPOFOL N/A 10/12/2015   Procedure: ESOPHAGOGASTRODUODENOSCOPY (EGD) WITH PROPOFOL;  Surgeon: Garlan Fair, MD;  Location: [facility];  Service: Endoscopy;  Laterality: N/A;  . SHOULDER ARTHROTOMY     x2 procedures- 1 open, 1 scope.  . TONSILLECTOMY    . TRANSURETHRAL RESECTION OF PROSTATE      There were no vitals filed for this visit.    Subjective Assessment - 05/31/20 0546    Subjective pt reports a 1 year Hx of hip pain primarily at night. Pt states he sleeps on his side due to CPAP. He  can go to sleep, but wakes up in the night to due to pain and switches sides. Pt states he walks and swims without an issues with his hips. Additionally, pt reports having back pain as well    Limitations Other (comment)   Sleeping   How long can you sit comfortably? Not an issue c hips    How long can you stand comfortably? Not an issue c hips    How long can you walk comfortably? Not an issue c hips    Diagnostic tests None or none available    Patient Stated Goals To sleep better    Currently in Pain? Yes   At night   Pain Location Hip    Pain Orientation Right;Left;Lateral    Pain Descriptors / Indicators Aching  Pain Type Chronic pain    Pain Radiating Towards NA    Pain Onset Other (comment)   1 year   Pain Frequency Intermittent    Aggravating Factors  Sleeping on side    Pain Relieving Factors Slowly gets better after he wakes up    Effect of Pain on Daily Activities low effect other than sleeping    Multiple Pain Sites Yes    Pain Score 6    Pain Location Back    Pain Orientation Posterior;Lower    Pain Descriptors / Indicators Aching    Pain Type Chronic pain    Pain Onset Other (comment)   greater than a year             [facility] PT Assessment - 05/31/20 0001      Assessment   Medical Diagnosis M70.62 (ICD-10-CM) - Trochanteric bursitis, left hip    Referring Provider (PT) Irven Shelling, MD    Onset Date/Surgical Date --   approx 1 year   Prior Therapy No    cortisone injectios B hips, approx 1 month ago     Precautions   Precautions None      Restrictions   Weight Bearing Restrictions No      Balance Screen   Has the patient fallen in the past 6 months No      [place] residence    Living Arrangements Spouse/significant other    Type of [place] to enter    Entrance Stairs-Number of Steps 2    Entrance Stairs-Rails --   none   Home Layout Two level      Prior Function   Level of [place] Retired    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   Overall Cognitive Status Within Functional Limits for tasks assessed      Observation/Other Assessments   Scoliosis yes c increased rib hump L thoracic    Focus on Therapeutic Outcomes (FOTO)  11% Limitation related to hip pain      Coordination   Gross Motor Movements are Fluid and Coordinated Yes      Posture/Postural Control   Posture/Postural Control Postural limitations    Postural Limitations Rounded Shoulders;Forward head;Increased lumbar lordosis;Increased thoracic kyphosis;Weight shift right   L rib hump due to scoliosis     Tone   Assessment Location Right Lower Extremity      ROM / Strength   AROM / PROM / Strength PROM;Strength      PROM   Overall PROM Comments Decreased hip PROM bilat especially IR       Strength   Overall Strength Comments Decreased hip strength for abd and ext at 4/5      Flexibility   Soft Tissue Assessment /Muscle Length yes    Hamstrings Decreased hamstring flxibility approx 50d  bilat      Palpation   Palpation comment Not tender around the GT aera but pt rreports this is where the pain is       Special Tests    Special Tests Hip Special Tests    Hip Special Tests  Saralyn Pilar (FABER) Test;Hip Scouring      Saralyn Pilar [facility]) Test   Findings Negative    Side Right   Lt     Hip Scouring   Findings Negative    Side Right   Lt     Ambulation/Gait  Gait Comments Forward flexed posture, decreased trunk/pelvic bow legged                      Objective measurements completed on examination: See above findings.               PT Education - 05/31/20 0557    Education Details Eval findings, POC, sleeping positions to decrease pressure on hips, use of cold pack for pain management, HEP for hip and low back flexibility.    Person(s) Educated Patient    Methods Explanation;Tactile cues;Demonstration;Verbal cues;Handout    Comprehension Verbalized understanding;Returned demonstration;Verbal cues required;Tactile cues required;Need further instruction            PT Short Term Goals - 05/31/20 9476      PT SHORT TERM GOAL #1   Title Pt wil be ind in an initial HEP to address bilat hip strength, ROM, and pain    Time 3    Period Weeks    Status New    Target Date 06/21/20      PT SHORT TERM GOAL #2   Title Pt will voice or demonstrate measure to assist in pain management of both hips    Time 3    Period Weeks    Status New    Target Date 06/21/20             PT Long Term Goals - 05/31/20 0630      PT LONG TERM GOAL #1   Title Pt will report being able to sleep 4 of 7 nights a week with interuption from hip pain    Baseline Nightly    Time 7    Period Weeks    Status New    Target Date 07/19/20      PT LONG TERM GOAL #2   Title Pt wil be ind in an final HEP to address bilat hip strength, ROM, and pain    Time 7    Period Weeks    Status New    Target Date 07/19/20                  Plan - 05/31/20 0601    Clinical Impression Statement Pt presents with bilat hip pain which primarily bother him when sleeping. Pt uses a CPAP and stateshe can only sleep on his sides. When a hip starts to hurt, he changes to the other side. Pt is able to go to sleep, but is awakened by pain and has difficulty returning to sleep.Pt has decreased flexibility of both hips and hamstrings, and decreased  strength of the the hip bilat abd and ext. Aditionally, pt reports a Hx of back pain which he has been told is related to arthitis. The back has postural issues of scoliosis, increased thoracic kyphosis and increased lordosis. Pt's back issues appear to be a contributing facorto hip issues.    Personal Factors and Comorbidities Past/Current Experience;Comorbidity 2    Comorbidities obesity, sleep apnea    Examination-Activity Limitations Sleep    Stability/Clinical Decision Making Evolving/Moderate complexity    Clinical Decision Making Moderate    Rehab Potential Good    PT Frequency 2x / week    PT Duration 2 weeks    PT Treatment/Interventions Cryotherapy;Electrical Stimulation;Ultrasound;Moist Heat;Iontophoresis 4mg /ml Dexamethasone;Therapeutic activities;Therapeutic exercise;Manual techniques;Passive range of motion;Dry needling;Taping;Spinal Manipulations;Joint Manipulations    PT Next Visit Plan Assess response to flexibility exs; Assess Thomas test    PT Home Exercise Plan [email or web address]  Consulted and Agree with Plan of Care Patient           Patient will benefit from skilled therapeutic intervention in order to improve the following deficits and impairments:  Decreased strength, Postural dysfunction, Pain, Other (comment), Decreased range of motion, Obesity (Sleep)  Visit Diagnosis: Pain in left hip - Plan: PT plan of care cert/re-cert  Pain in right hip - Plan: PT plan of care cert/re-cert  Chronic midline low back pain without sciatica - Plan: PT plan of care cert/re-cert  Muscle weakness (generalized) - Plan: PT plan of care cert/re-cert  Other idiopathic scoliosis, thoracic region - Plan: PT plan of care cert/re-cert  Bursitis of both hips, unspecified bursa - Plan: PT plan of care cert/re-cert     Problem List Patient Active Problem List   Diagnosis Date Noted  . High risk medication use 09/17/2018  . Hyperlipidemia 09/09/2018  . Hypertensive heart disease without  CHF 09/29/2016  . Obesity (BMI 30-39.9)   . History of DVT (deep vein thrombosis)   . Sleep apnea in adult   . History of prostate cancer   . Chronic anticoagulation 09/10/2016  . Paroxysmal atrial fibrillation [facility])     Gar Ponto MS, PT 05/31/20 6:37 AM  [place], [place], [address] Phone: [phone]   Fax:  [phone]  Name: [name] MRN: [id number] Date of Birth: 10-29-1951

## 2020-06-08 ENCOUNTER — Encounter: Payer: Self-pay | Admitting: Physical Therapy

## 2020-06-08 ENCOUNTER — Ambulatory Visit: Payer: Medicare Other | Attending: Internal Medicine | Admitting: Physical Therapy

## 2020-06-08 ENCOUNTER — Other Ambulatory Visit: Payer: Self-pay

## 2020-06-08 DIAGNOSIS — M7072 Other bursitis of hip, left hip: Secondary | ICD-10-CM | POA: Insufficient documentation

## 2020-06-08 DIAGNOSIS — M7071 Other bursitis of hip, right hip: Secondary | ICD-10-CM | POA: Diagnosis not present

## 2020-06-08 DIAGNOSIS — M6281 Muscle weakness (generalized): Secondary | ICD-10-CM

## 2020-06-08 DIAGNOSIS — G8929 Other chronic pain: Secondary | ICD-10-CM | POA: Diagnosis not present

## 2020-06-08 DIAGNOSIS — M545 Low back pain: Secondary | ICD-10-CM | POA: Insufficient documentation

## 2020-06-08 DIAGNOSIS — M25551 Pain in right hip: Secondary | ICD-10-CM

## 2020-06-08 DIAGNOSIS — M25552 Pain in left hip: Secondary | ICD-10-CM | POA: Diagnosis not present

## 2020-06-08 DIAGNOSIS — M4124 Other idiopathic scoliosis, thoracic region: Secondary | ICD-10-CM | POA: Insufficient documentation

## 2020-06-08 NOTE — Patient Instructions (Signed)
Access Code: [email or web address] URL: [email or web address] Date: 06/08/2020 Prepared by: Kearney Hard  Exercises Supine Posterior Pelvic Tilt - 3 x daily - 7 x weekly - 1 sets - 10 reps - 5 hold Hooklying Single Knee to Chest Stretch - 3 x daily - 7 x weekly - 1 sets - 10 reps - 15-30 hold Supine Piriformis Stretch with Foot on Ground - 3 x daily - 7 x weekly - 1 sets - 10 reps - 15-30 hold Supine Lower Trunk Rotation - 2-3 x daily - 7 x weekly - 1 sets - 3-5 reps - 15-30 seconds hold Modified Thomas Stretch - 2-3 x daily - 7 x weekly - 1 sets - 10 reps - 15-30 seconds hold Thomas Stretch on Table - 2-3 x daily - 7 x weekly - 1 sets - 10 reps - 15-30 seconds hold Supine Bridge with Mini Swiss Ball Between Knees - 2-3 x daily - 7 x weekly - 2 sets - 10 reps - 5 seconds hold Seated Hip Abduction - 2-3 x daily - 7 x weekly - 2 sets - 10 reps

## 2020-06-08 NOTE — Therapy (Signed)
[place], [place], [address] Phone: [phone]   Fax:  [phone]  Physical Therapy Treatment  Patient Details  Name: [name] MRN: [id number] Date of Birth: 08-20-1951 Referring Provider (PT): Irven Shelling, MD   Encounter Date: 06/08/2020   PT End of Session - 06/08/20 1620    Visit Number 2    Number of Visits 13    Date for PT Re-Evaluation 08/30/20    Authorization Type MEDICARE PART A AND B; [employer]ARP    Progress Note Due on Visit 10    PT Start Time 1515    PT Stop Time 1558    PT Time Calculation (min) 43 min    Activity Tolerance Patient tolerated treatment well    Behavior During Therapy [facility] for tasks assessed/performed           Past Medical History:  Diagnosis Date  . Arthritis    arthritis -back  . Atrial fibrillation ([place])    a. diagnosed 09/2016 - started on Cardizem CD and Flecainide. Continue Coumadin for anticoagulation.  . Cancer [facility])    cancer Prostate- surgery only  . Chronic anticoagulation 09/10/2016  . History of DVT (deep vein thrombosis)   . History of DVT of lower extremity    left leg has some residual crculation issues  . History of prostate cancer   . Hyperlipidemia 09/09/2018  . Hypertension   . Hypertensive heart disease without CHF 09/29/2016  . Obesity (BMI 30-39.9)   . Paroxysmal atrial fibrillation (HCC)    CHA2DS2VASC score 2  . Sleep apnea    a. uses CPAP  . Sleep apnea in adult     Past Surgical History:  Procedure Laterality Date  . BALLOON DILATION N/A 10/12/2015   Procedure: BALLOON DILATION;  Surgeon: Garlan Fair, MD;  Location: [facility];  Service: Endoscopy;  Laterality: N/A;  . COLONOSCOPY W/ POLYPECTOMY     x2 colon polyps found  . COLONOSCOPY WITH PROPOFOL N/A 10/12/2015   Procedure: COLONOSCOPY WITH PROPOFOL;  Surgeon: Garlan Fair, MD;  Location: [facility];  Service: Endoscopy;  Laterality: N/A;  .  ESOPHAGOGASTRODUODENOSCOPY (EGD) WITH PROPOFOL N/A 10/12/2015   Procedure: ESOPHAGOGASTRODUODENOSCOPY (EGD) WITH PROPOFOL;  Surgeon: Garlan Fair, MD;  Location: [facility];  Service: Endoscopy;  Laterality: N/A;  . SHOULDER ARTHROTOMY     x2 procedures- 1 open, 1 scope.  . TONSILLECTOMY    . TRANSURETHRAL RESECTION OF PROSTATE      There were no vitals filed for this visit.   Subjective Assessment - 06/08/20 1616    Subjective Pt arriving reporting no pain in his hips at present time. Pt did however report pain in bilateral hips when trying to sleep on his side. Pt reports he has to sleep on his side for his CPAP machine to work properly.    Limitations Other (comment)    Patient Stated Goals To sleep better, my back to stop hurting    Currently in Pain? Yes    Pain Score 5     Pain Location Back    Pain Orientation Lower    Pain Descriptors / Indicators Aching    Pain Type Chronic pain                             [facility] Adult PT Treatment/Exercise - 06/08/20 0001      Exercises   Exercises Lumbar   hip  Lumbar Exercises: Stretches   Hip Flexor Stretch Right;Left;5 reps;30 seconds    Pelvic Tilt 10 reps;5 seconds    Pelvic Tilt Limitations instructions for core activation    Piriformis Stretch Right;Left;5 reps;30 seconds    Other Lumbar Stretch Exercise lower trunk rotation stretch with knees bent x 3 to each side holding 30 seconds      Lumbar Exercises: Seated   Other Seated Lumbar Exercises clam shells 2x 10, green theraband      Lumbar Exercises: Supine   Ab Set 10 reps;5 seconds    Bridge with Ball Squeeze 10 reps;5 seconds    Other Supine Lumbar Exercises hip flexion and IR resisted exercise with green theraband x 10                   PT Education - 06/08/20 1618    Education Details Pt's HEP was updated. (choices given for [employer] vs modified Thomas stretch)    Person(s) Educated Patient    Methods  Explanation;Demonstration;Other (comment)    Comprehension Verbalized understanding;Returned demonstration            PT Short Term Goals - 06/08/20 1629      PT SHORT TERM GOAL #1   Title Pt wil be ind in an initial HEP to address bilat hip strength, ROM, and pain    Status On-going      PT SHORT TERM GOAL #2   Title Pt will voice or demonstrate measure to assist in pain management of both hips    Status On-going             PT Long Term Goals - 06/08/20 1629      PT LONG TERM GOAL #1   Title Pt will report being able to sleep 4 of 7 nights a week with interuption from hip pain    Baseline Nightly    Status On-going      PT LONG TERM GOAL #2   Title Pt wil be ind in an final HEP to address bilat hip strength, ROM, and pain    Status On-going                 Plan - 06/08/20 1621    Clinical Impression Statement Pt arriving today with resport from past MRI on 02/06/2019. MRI impression revealed h/o compression fx of L1 with 30% loss of vertebral body height, Schmorl's node at inferior endplate of L2, multi-level degenerative disc and facet disease in lumbar spine, suggestion of osseous hemangiomas in L4 and L5, vetebral bodies. Pt reporting more back pain than hip pain. Pt tolerated all exercises well. Pt's HEP was updated with focusing on stretching exercises. Continue skilled PT progressiong toward goals set.    Comorbidities obesity, sleep apnea,h/o compression fx of L1 with 30% loss of vertebral body height, Schmorl's node at inferior endplate of L2, multi-level degenerative disc and facet disease in lumbar spine, suggestion of osseous hemangiomas in L4 and L5, vetebral bodies.    Examination-Activity Limitations Sleep    Stability/Clinical Decision Making Evolving/Moderate complexity    Rehab Potential Good    PT Frequency 2x / week    PT Duration 2 weeks    PT Treatment/Interventions Cryotherapy;Electrical Stimulation;Ultrasound;Moist Heat;Iontophoresis 4mg /ml  Dexamethasone;Therapeutic activities;Therapeutic exercise;Manual techniques;Passive range of motion;Dry needling;Taping;Spinal Manipulations;Joint Manipulations    PT Next Visit Plan Hamstring/ IT band stretches, core strengthening, lumbar stretching    PT Home Exercise Plan [id number]    Consulted and Agree with Plan of Care Patient  Patient will benefit from skilled therapeutic intervention in order to improve the following deficits and impairments:  Decreased strength, Postural dysfunction, Pain, Other (comment), Decreased range of motion, Obesity  Visit Diagnosis: Pain in left hip  Pain in right hip  Chronic midline low back pain without sciatica  Muscle weakness (generalized)     Problem List Patient Active Problem List   Diagnosis Date Noted  . High risk medication use 09/17/2018  . Hyperlipidemia 09/09/2018  . Hypertensive heart disease without CHF 09/29/2016  . Obesity (BMI 30-39.9)   . History of DVT (deep vein thrombosis)   . Sleep apnea in adult   . History of prostate cancer   . Chronic anticoagulation 09/10/2016  . Paroxysmal atrial fibrillation ([place])     Oretha Caprice , PT, MPT 06/08/2020, 4:30 PM  [facility] [address] [place], [place], [address] Phone: [phone]   Fax:  [phone]  Name: [name] MRN: [id number] Date of Birth: 22-May-1951

## 2020-06-11 ENCOUNTER — Other Ambulatory Visit: Payer: Self-pay

## 2020-06-11 ENCOUNTER — Encounter: Payer: Self-pay | Admitting: Rehabilitative and Restorative Service Providers"

## 2020-06-11 ENCOUNTER — Ambulatory Visit: Payer: Medicare Other | Admitting: Rehabilitative and Restorative Service Providers"

## 2020-06-11 DIAGNOSIS — M4124 Other idiopathic scoliosis, thoracic region: Secondary | ICD-10-CM

## 2020-06-11 DIAGNOSIS — M25552 Pain in left hip: Secondary | ICD-10-CM

## 2020-06-11 DIAGNOSIS — M25551 Pain in right hip: Secondary | ICD-10-CM

## 2020-06-11 DIAGNOSIS — M545 Low back pain, unspecified: Secondary | ICD-10-CM

## 2020-06-11 DIAGNOSIS — G8929 Other chronic pain: Secondary | ICD-10-CM | POA: Diagnosis not present

## 2020-06-11 DIAGNOSIS — M6281 Muscle weakness (generalized): Secondary | ICD-10-CM

## 2020-06-11 DIAGNOSIS — M7072 Other bursitis of hip, left hip: Secondary | ICD-10-CM

## 2020-06-11 NOTE — Therapy (Signed)
[place], [place], [address] Phone: [phone]   Fax:  [phone]  Physical Therapy Treatment  Patient Details  Name: [name] MRN: [id number] Date of Birth: 1951/07/03 Referring Provider (PT): Irven Shelling, MD   Encounter Date: 06/11/2020   PT End of Session - 06/11/20 0924    Visit Number 3    Number of Visits 13    Date for PT Re-Evaluation 08/30/20    Authorization Type MEDICARE PART A AND B; AARP    Progress Note Due on Visit 10    PT Start Time 0920    PT Stop Time 1006    PT Time Calculation (min) 46 min           Past Medical History:  Diagnosis Date   Arthritis    arthritis -back   Atrial fibrillation ([place])    a. diagnosed 09/2016 - started on Cardizem CD and Flecainide. Continue Coumadin for anticoagulation.   Cancer [facility])    cancer Prostate- surgery only   Chronic anticoagulation 09/10/2016   History of DVT (deep vein thrombosis)    History of DVT of lower extremity    left leg has some residual crculation issues   History of prostate cancer    Hyperlipidemia 09/09/2018   Hypertension    Hypertensive heart disease without CHF 09/29/2016   Obesity (BMI 30-39.9)    Paroxysmal atrial fibrillation (HCC)    CHA2DS2VASC score 2   Sleep apnea    a. uses CPAP   Sleep apnea in adult     Past Surgical History:  Procedure Laterality Date   BALLOON DILATION N/A 10/12/2015   Procedure: BALLOON DILATION;  Surgeon: Garlan Fair, MD;  [facility]: [facility];  Service: Endoscopy;  Laterality: N/A;   COLONOSCOPY W/ POLYPECTOMY     x2 colon polyps found   COLONOSCOPY WITH PROPOFOL N/A 10/12/2015   Procedure: COLONOSCOPY WITH PROPOFOL;  Surgeon: Garlan Fair, MD;  [facility]: [facility];  Service: Endoscopy;  Laterality: N/A;   ESOPHAGOGASTRODUODENOSCOPY (EGD) WITH PROPOFOL N/A 10/12/2015   Procedure: ESOPHAGOGASTRODUODENOSCOPY (EGD) WITH PROPOFOL;   Surgeon: Garlan Fair, MD;  [facility];  Service: Endoscopy;  Laterality: N/A;   SHOULDER ARTHROTOMY     x2 procedures- 1 open, 1 scope.   TONSILLECTOMY     TRANSURETHRAL RESECTION OF PROSTATE      There were no vitals filed for this visit.   Subjective Assessment - 06/11/20 0926    Subjective 3/10; the body pillow and sleeping are a little bit bitter    Limitations Other (comment)    How long can you sit comfortably? Not an issue c hips    How long can you stand comfortably? Not an issue c hips    How long can you walk comfortably? Not an issue c hips    Diagnostic tests None or none available    Patient Stated Goals To sleep better, my back to stop hurting    Currently in Pain? Yes    Pain Score 4     Pain Location Back    Pain Orientation Lower    Pain Descriptors / Indicators Aching    Pain Type Chronic pain    Pain Onset Other (comment)    Pain Frequency Intermittent    Aggravating Factors  sleeping on side    Multiple Pain Sites No  [facility] Adult PT Treatment/Exercise - 06/11/20 0001      Lumbar Exercises: Standing   Other Standing Lumbar Exercises standing hamstring stretch 3x30 sec reaching as able to floor; standing tilt with glute set and hip ext combo x 15; tilt with donkey kick x 15 to stretch out hip flexors      Lumbar Exercises: Seated   Other Seated Lumbar Exercises seated hamstring stretch 2x30 sec;      Lumbar Exercises: Supine   Other Supine Lumbar Exercises tilt x 10 with concentration on pelvic rock; tilt with heel slide x 20; tilt with SLR x 15; tilt with green theraband clam shell x 15; tilt w/green theraband clam shell iso with bridge x 15; tilt with SLR/hip abdct combo x 15; tilt with UE flex/ext x 20; LTR 2x15 sec; tilt with ball squeeze and holding yellow weighted ball in hands doing perturbations forward/backward and side to side x 20 each; tilt with ball squeeze with bridge x 20; single  knee to chest 2x30 sec; hamstring stretch 2x30 sec each                  PT Education - 06/11/20 1002    Education Details educated pt on performing tilt especially before any transitional movements    Person(s) Educated Patient    Methods Explanation;Demonstration    Comprehension Verbalized understanding;Returned demonstration            PT Short Term Goals - 06/08/20 1629      PT SHORT TERM GOAL #1   Title Pt wil be ind in an initial HEP to address bilat hip strength, ROM, and pain    Status On-going      PT SHORT TERM GOAL #2   Title Pt will voice or demonstrate measure to assist in pain management of both hips    Status On-going             PT Long Term Goals - 06/08/20 1629      PT LONG TERM GOAL #1   Title Pt will report being able to sleep 4 of 7 nights a week with interuption from hip pain    Baseline Nightly    Status On-going      PT LONG TERM GOAL #2   Title Pt wil be ind in an final HEP to address bilat hip strength, ROM, and pain    Status On-going                 Plan - 06/11/20 3016    Clinical Impression Statement Pt reports having 3-4/10 LBP. Pt would benefit from further PT for lumbar/core strengthening, hip strengthening, hip flexibility all bil. Monitor and address pain as needed.Pt arriving today with resport from past MRI on 02/06/2019. MRI impression revealed h/o compression fx of L1 with 30% loss of vertebral body height, Schmorl's node at inferior endplate of L2, multi-level degenerative disc and facet disease in lumbar spine, suggestion of osseous hemangiomas in L4 and L5, vetebral bodies.    Rehab Potential Good    PT Frequency 2x / week    PT Duration 2 weeks    PT Treatment/Interventions Cryotherapy;Electrical Stimulation;Ultrasound;Moist Heat;Iontophoresis 4mg /ml Dexamethasone;Therapeutic activities;Therapeutic exercise;Manual techniques;Passive range of motion;Dry needling;Taping;Spinal Manipulations;Joint Manipulations     PT Next Visit Plan Hamstring/ IT band stretches, core strengthening, lumbar stretching    PT Home Exercise Plan [id number]    Consulted and Agree with Plan of Care Patient  Patient will benefit from skilled therapeutic intervention in order to improve the following deficits and impairments:  Decreased strength, Postural dysfunction, Pain, Other (comment), Decreased range of motion, Obesity  Visit Diagnosis: Pain in left hip  Pain in right hip  Chronic midline low back pain without sciatica  Muscle weakness (generalized)  Other idiopathic scoliosis, thoracic region  Bursitis of both hips, unspecified bursa     Problem List Patient Active Problem List   Diagnosis Date Noted   High risk medication use 09/17/2018   Hyperlipidemia 09/09/2018   Hypertensive heart disease without CHF 09/29/2016   Obesity (BMI 30-39.9)    History of DVT (deep vein thrombosis)    Sleep apnea in adult    History of prostate cancer    Chronic anticoagulation 09/10/2016   Paroxysmal atrial fibrillation ([place])     [name], PT 06/11/2020, 10:07 AM  [place], [place], [address] Phone: [phone]   Fax:  [phone]  Name: [name] MRN: [id number] Date of Birth: 1951-10-17

## 2020-06-14 ENCOUNTER — Other Ambulatory Visit: Payer: Self-pay

## 2020-06-14 ENCOUNTER — Ambulatory Visit: Payer: Medicare Other

## 2020-06-14 DIAGNOSIS — M4124 Other idiopathic scoliosis, thoracic region: Secondary | ICD-10-CM | POA: Diagnosis not present

## 2020-06-14 DIAGNOSIS — M7071 Other bursitis of hip, right hip: Secondary | ICD-10-CM

## 2020-06-14 DIAGNOSIS — M25552 Pain in left hip: Secondary | ICD-10-CM

## 2020-06-14 DIAGNOSIS — M6281 Muscle weakness (generalized): Secondary | ICD-10-CM | POA: Diagnosis not present

## 2020-06-14 DIAGNOSIS — M25551 Pain in right hip: Secondary | ICD-10-CM | POA: Diagnosis not present

## 2020-06-14 DIAGNOSIS — M545 Low back pain, unspecified: Secondary | ICD-10-CM

## 2020-06-14 DIAGNOSIS — G8929 Other chronic pain: Secondary | ICD-10-CM | POA: Diagnosis not present

## 2020-06-14 NOTE — Therapy (Signed)
[name], [place], [address] Phone: [phone]   Fax:  [phone]  Physical Therapy Treatment  Patient Details  Name: [name] MRN: [id number] Date of Birth: 21-Aug-1951 Referring Provider (PT): Irven Shelling, MD   Encounter Date: 06/14/2020   PT End of Session - 06/14/20 1445    Visit Number 4    Number of Visits 13    Date for PT Re-Evaluation 08/30/20    Authorization Type MEDICARE PART A AND B; AARP    Progress Note Due on Visit 10    PT Start Time 0915    PT Stop Time 1005    PT Time Calculation (min) 50 min    Activity Tolerance Patient tolerated treatment well    Behavior During Therapy [facility] for tasks assessed/performed           Past Medical History:  Diagnosis Date  . Arthritis    arthritis -back  . Atrial fibrillation ([place])    a. diagnosed 09/2016 - started on Cardizem CD and Flecainide. Continue Coumadin for anticoagulation.  . Cancer [facility])    cancer Prostate- surgery only  . Chronic anticoagulation 09/10/2016  . History of DVT (deep vein thrombosis)   . History of DVT of lower extremity    left leg has some residual crculation issues  . History of prostate cancer   . Hyperlipidemia 09/09/2018  . Hypertension   . Hypertensive heart disease without CHF 09/29/2016  . Obesity (BMI 30-39.9)   . Paroxysmal atrial fibrillation (HCC)    CHA2DS2VASC score 2  . Sleep apnea    a. uses CPAP  . Sleep apnea in adult     Past Surgical History:  Procedure Laterality Date  . BALLOON DILATION N/A 10/12/2015   Procedure: BALLOON DILATION;  Surgeon: Garlan Fair, MD;  Location: [facility];  Service: Endoscopy;  Laterality: N/A;  . COLONOSCOPY W/ POLYPECTOMY     x2 colon polyps found  . COLONOSCOPY WITH PROPOFOL N/A 10/12/2015   Procedure: COLONOSCOPY WITH PROPOFOL;  Surgeon: Garlan Fair, MD;  [facility];  Service: Endoscopy;  Laterality: N/A;  .  ESOPHAGOGASTRODUODENOSCOPY (EGD) WITH PROPOFOL N/A 10/12/2015   Procedure: ESOPHAGOGASTRODUODENOSCOPY (EGD) WITH PROPOFOL;  Surgeon: Garlan Fair, MD;  [facility];  Service: Endoscopy;  Laterality: N/A;  . SHOULDER ARTHROTOMY     x2 procedures- 1 open, 1 scope.  . TONSILLECTOMY    . TRANSURETHRAL RESECTION OF PROSTATE      There were no vitals filed for this visit.   Subjective Assessment - 06/14/20 1433    Subjective Currently 0/10 for both hips and low back. Hip pain is at night. Pt states he is having more nights with less pain, although, still has some nights when is sllep is affected by pain. Pt staes his intermittent low back pain is the same.                             [place] Adult PT Treatment/Exercise - 06/14/20 0001      Exercises   Exercises Lumbar      Lumbar Exercises: Stretches   Single Knee to Chest Stretch 3 reps;20 seconds    Hip Flexor Stretch Right;Left;2 reps;30 seconds    Pelvic Tilt 10 reps    Pelvic Tilt Limitations 3 Sec; instructions for core activation    Piriformis Stretch Right;Left;20 seconds;3 reps    Figure 4 Stretch 2 reps;20  seconds;With overpressure;Supine    Figure 4 Stretch Limitations For ER      Lumbar Exercises: Supine   Bridge with Ball Squeeze 10 reps;5 seconds    Other Supine Lumbar Exercises Abd mini crunch 10x                  PT Education - 06/14/20 1439    Education Details Education: New exs for HEP, instruction in proper technique for previous exs, use of cold pack for hip pain, use of positioning in SL and trying supine c pillows under knees to decreased pressure/pain of hips.    Person(s) Educated Patient    Methods Explanation;Demonstration;Tactile cues;Verbal cues;Handout    Comprehension Returned demonstration;Verbalized understanding;Tactile cues required;Verbal cues required            PT Short Term Goals - 06/08/20 1629      PT SHORT TERM GOAL #1   Title Pt wil be ind in an  initial HEP to address bilat hip strength, ROM, and pain    Status On-going      PT SHORT TERM GOAL #2   Title Pt will voice or demonstrate measure to assist in pain management of both hips    Status On-going             PT Long Term Goals - 06/08/20 1629      PT LONG TERM GOAL #1   Title Pt will report being able to sleep 4 of 7 nights a week with interuption from hip pain    Baseline Nightly    Status On-going      PT LONG TERM GOAL #2   Title Pt wil be ind in an final HEP to address bilat hip strength, ROM, and pain    Status On-going                 Plan - 06/14/20 1552    Clinical Impression Statement Today's session focused o hip and low back flexibility and core strengthening. Pt had difficulty engaging his abdominal with the PPT, so a mini crunch was added. Pt was able to engage his abs with this ex. With pt also only having pain with his hips at night with sidelying, Discussed with pt positioning in sidelying and trying sleeping supine c pillows under his knees to reduce this contributing factor, and the use of a cold pack at night for hips when they become irritated. Pt voiced understanding of these measures.    Personal Factors and Comorbidities Past/Current Experience;Comorbidity 2    Comorbidities obesity, sleep apnea,h/o compression fx of L1 with 30% loss of vertebral body height, Schmorl's node at inferior endplate of L2, multi-level degenerative disc and facet disease in lumbar spine, suggestion of osseous hemangiomas in L4 and L5, vetebral bodies.    Examination-Activity Limitations Sleep    Stability/Clinical Decision Making Evolving/Moderate complexity    Clinical Decision Making Moderate    Rehab Potential Good    PT Frequency 2x / week    PT Duration 6 weeks    PT Treatment/Interventions Cryotherapy;Electrical Stimulation;Ultrasound;Moist Heat;Iontophoresis 4mg /ml Dexamethasone;Therapeutic activities;Therapeutic exercise;Manual techniques;Passive range  of motion;Dry needling;Taping;Spinal Manipulations;Joint Manipulations    PT Next Visit Plan Assess hip pain with sleeping following today's education.Continue flexibility and core strengthening.    PT Home Exercise Plan [id number]    Consulted and Agree with Plan of Care Patient           Patient will benefit from skilled therapeutic intervention in order to improve the following deficits  and impairments:  Decreased strength, Postural dysfunction, Pain, Other (comment), Decreased range of motion, Obesity  Visit Diagnosis: Pain in left hip  Pain in right hip  Chronic midline low back pain without sciatica  Muscle weakness (generalized)  Other idiopathic scoliosis, thoracic region  Bursitis of both hips, unspecified bursa     Problem List Patient Active Problem List   Diagnosis Date Noted  . High risk medication use 09/17/2018  . Hyperlipidemia 09/09/2018  . Hypertensive heart disease without CHF 09/29/2016  . Obesity (BMI 30-39.9)   . History of DVT (deep vein thrombosis)   . Sleep apnea in adult   . History of prostate cancer   . Chronic anticoagulation 09/10/2016  . Paroxysmal atrial fibrillation [facility])     Gar Ponto MS, PT 06/14/20 4:21 PM  [place] [facility] [address] [place], [place], [address] Phone: [phone]   Fax:  [phone]  Name: Demonie Kassa MRN: [id number] Date of Birth: 1951/04/27

## 2020-06-18 ENCOUNTER — Ambulatory Visit: Payer: Medicare Other

## 2020-06-18 ENCOUNTER — Other Ambulatory Visit: Payer: Self-pay

## 2020-06-18 DIAGNOSIS — M545 Low back pain: Secondary | ICD-10-CM | POA: Diagnosis not present

## 2020-06-18 DIAGNOSIS — G8929 Other chronic pain: Secondary | ICD-10-CM | POA: Diagnosis not present

## 2020-06-18 DIAGNOSIS — M25552 Pain in left hip: Secondary | ICD-10-CM | POA: Diagnosis not present

## 2020-06-18 DIAGNOSIS — M4124 Other idiopathic scoliosis, thoracic region: Secondary | ICD-10-CM

## 2020-06-18 DIAGNOSIS — M25551 Pain in right hip: Secondary | ICD-10-CM | POA: Diagnosis not present

## 2020-06-18 DIAGNOSIS — M6281 Muscle weakness (generalized): Secondary | ICD-10-CM | POA: Diagnosis not present

## 2020-06-18 DIAGNOSIS — M7071 Other bursitis of hip, right hip: Secondary | ICD-10-CM

## 2020-06-18 NOTE — Therapy (Signed)
[place], [place], [address] Phone: [phone]   Fax:  [phone]  Physical Therapy Treatment  Patient Details  Name: [name] MRN: [id number] Date of Birth: Apr 22, 1951 Referring Provider (PT): Irven Shelling, MD   Encounter Date: 06/18/2020   PT End of Session - 06/18/20 1023    Visit Number 5    Number of Visits 13    Date for PT Re-Evaluation 08/30/20    Authorization Type MEDICARE PART A AND B; AARP    Progress Note Due on Visit 10    PT Start Time 0920    PT Stop Time 1005    PT Time Calculation (min) 45 min    Activity Tolerance Patient tolerated treatment well    Behavior During Therapy [facility] for tasks assessed/performed           Past Medical History:  Diagnosis Date  . Arthritis    arthritis -back  . Atrial fibrillation ([place])    a. diagnosed 09/2016 - started on Cardizem CD and Flecainide. Continue Coumadin for anticoagulation.  . Cancer [facility])    cancer Prostate- surgery only  . Chronic anticoagulation 09/10/2016  . History of DVT (deep vein thrombosis)   . History of DVT of lower extremity    left leg has some residual crculation issues  . History of prostate cancer   . Hyperlipidemia 09/09/2018  . Hypertension   . Hypertensive heart disease without CHF 09/29/2016  . Obesity (BMI 30-39.9)   . Paroxysmal atrial fibrillation (HCC)    CHA2DS2VASC score 2  . Sleep apnea    a. uses CPAP  . Sleep apnea in adult     Past Surgical History:  Procedure Laterality Date  . BALLOON DILATION N/A 10/12/2015   Procedure: BALLOON DILATION;  Surgeon: Garlan Fair, MD;  Location: [facility];  Service: Endoscopy;  Laterality: N/A;  . COLONOSCOPY W/ POLYPECTOMY     x2 colon polyps found  . COLONOSCOPY WITH PROPOFOL N/A 10/12/2015   Procedure: COLONOSCOPY WITH PROPOFOL;  Surgeon: Garlan Fair, MD;  Location: [facility];  Service: Endoscopy;  Laterality: N/A;  .  ESOPHAGOGASTRODUODENOSCOPY (EGD) WITH PROPOFOL N/A 10/12/2015   Procedure: ESOPHAGOGASTRODUODENOSCOPY (EGD) WITH PROPOFOL;  Surgeon: Garlan Fair, MD;  Location: [facility];  Service: Endoscopy;  Laterality: N/A;  . SHOULDER ARTHROTOMY     x2 procedures- 1 open, 1 scope.  . TONSILLECTOMY    . TRANSURETHRAL RESECTION OF PROSTATE      There were no vitals filed for this visit.   Subjective Assessment - 06/18/20 0926    Subjective Pt reports having his first night of sleep without hip pain. Pt also reports his low back pain is at a lower level than usual at a 3/10. pt states he has decreased the firmness of his sleep number bed which he feels has been helpful.    Patient Stated Goals To sleep better, my back to stop hurting    Currently in Pain? Yes    Pain Score 3     Pain Location Back    Pain Orientation Lower    Pain Descriptors / Indicators Aching    Pain Type Chronic pain    Pain Onset Other (comment)    Pain Frequency Intermittent    Aggravating Factors  Sleeping on sides    Pain Relieving Factors Slowlt gets better after getting up    Effect of Pain on Daily Activities Low effect other than sleeping  [place] Adult PT Treatment/Exercise - 06/18/20 0001      Lumbar Exercises: Stretches   Single Knee to Chest Stretch 20 seconds;1 rep    Hip Flexor Stretch Right;Left;2 reps;30 seconds    Pelvic Tilt 10 reps    Pelvic Tilt Limitations 3 Sec; c briging c hip add c ball                  PT Education - 06/18/20 1020    Education Details HEP: ITB stretch, Tband for scapular retractions and sh extension    Person(s) Educated Patient    Methods Explanation;Demonstration;Tactile cues;Verbal cues;Handout    Comprehension Verbalized understanding;Returned demonstration;Verbal cues required;Tactile cues required            PT Short Term Goals - 06/08/20 1629      PT SHORT TERM GOAL #1   Title Pt wil be ind in an initial  HEP to address bilat hip strength, ROM, and pain    Status On-going      PT SHORT TERM GOAL #2   Title Pt will voice or demonstrate measure to assist in pain management of both hips    Status On-going             PT Long Term Goals - 06/08/20 1629      PT LONG TERM GOAL #1   Title Pt will report being able to sleep 4 of 7 nights a week with interuption from hip pain    Baseline Nightly    Status On-going      PT LONG TERM GOAL #2   Title Pt wil be ind in an final HEP to address bilat hip strength, ROM, and pain    Status On-going                 Plan - 06/18/20 1024    Clinical Impression Statement Pt' subective report indicates no hip pain c sleeping last night and that his LBP has generally been better. Aother hip stretching exs was added and back strengthening exs were added.    Personal Factors and Comorbidities Past/Current Experience;Comorbidity 2    Comorbidities obesity, sleep apnea,h/o compression fx of L1 with 30% loss of vertebral body height, Schmorl's node at inferior endplate of L2, multi-level degenerative disc and facet disease in lumbar spine, suggestion of osseous hemangiomas in L4 and L5, vetebral bodies.    Examination-Activity Limitations Sleep    Stability/Clinical Decision Making Evolving/Moderate complexity    Clinical Decision Making Moderate    Rehab Potential Good    PT Frequency 2x / week    PT Duration 6 weeks    PT Treatment/Interventions Cryotherapy;Electrical Stimulation;Ultrasound;Moist Heat;Iontophoresis 4mg /ml Dexamethasone;Therapeutic activities;Therapeutic exercise;Manual techniques;Passive range of motion;Dry needling;Taping;Spinal Manipulations;Joint Manipulations    PT Next Visit Plan Assess response to new exs. Continue course of PT for back/core and hip flexibility and strengthening exs.    PT Home Exercise Plan [id number]    Consulted and Agree with Plan of Care Patient           Patient will benefit from skilled therapeutic  intervention in order to improve the following deficits and impairments:  Decreased strength, Postural dysfunction, Pain, Other (comment), Decreased range of motion, Obesity  Visit Diagnosis: Pain in left hip  Pain in right hip  Chronic midline low back pain without sciatica  Muscle weakness (generalized)  Other idiopathic scoliosis, thoracic region  Bursitis of both hips, unspecified bursa     Problem List Patient Active Problem List  Diagnosis Date Noted  . High risk medication use 09/17/2018  . Hyperlipidemia 09/09/2018  . Hypertensive heart disease without CHF 09/29/2016  . Obesity (BMI 30-39.9)   . History of DVT (deep vein thrombosis)   . Sleep apnea in adult   . History of prostate cancer   . Chronic anticoagulation 09/10/2016  . Paroxysmal atrial fibrillation [facility])     Gar Ponto MS, PT 06/18/20 10:36 AM  [facility] [address] [place], [place], [address] Phone: [phone]   Fax:  [phone]  Name: Osiel Stick MRN: [id number] Date of Birth: 1951/10/07

## 2020-06-21 ENCOUNTER — Ambulatory Visit: Payer: Medicare Other

## 2020-06-21 ENCOUNTER — Other Ambulatory Visit: Payer: Self-pay

## 2020-06-21 DIAGNOSIS — M4124 Other idiopathic scoliosis, thoracic region: Secondary | ICD-10-CM

## 2020-06-21 DIAGNOSIS — M25551 Pain in right hip: Secondary | ICD-10-CM | POA: Diagnosis not present

## 2020-06-21 DIAGNOSIS — G8929 Other chronic pain: Secondary | ICD-10-CM | POA: Diagnosis not present

## 2020-06-21 DIAGNOSIS — M6281 Muscle weakness (generalized): Secondary | ICD-10-CM

## 2020-06-21 DIAGNOSIS — M7072 Other bursitis of hip, left hip: Secondary | ICD-10-CM

## 2020-06-21 DIAGNOSIS — M25552 Pain in left hip: Secondary | ICD-10-CM | POA: Diagnosis not present

## 2020-06-21 DIAGNOSIS — M545 Low back pain: Secondary | ICD-10-CM | POA: Diagnosis not present

## 2020-06-22 NOTE — Therapy (Signed)
[place], [place], [address] Phone: [phone]   Fax:  [phone]  Physical Therapy Treatment  Patient Details  Name: [name] MRN: [id number] Date of Birth: [birth year] Referring Provider (PT): Irven Shelling, MD   Encounter Date: 06/21/2020   PT End of Session - 06/22/20 0808    Visit Number 6    Number of Visits 13    Date for PT Re-Evaluation 08/30/20    Authorization Type MEDICARE PART A AND B; AARP    PT Start Time 0921    PT Stop Time 1003    PT Time Calculation (min) 42 min    Activity Tolerance Patient tolerated treatment well    Behavior During Therapy [facility] for tasks assessed/performed           Past Medical History:  Diagnosis Date  . Arthritis    arthritis -back  . Atrial fibrillation ([place])    a. diagnosed 09/2016 - started on Cardizem CD and Flecainide. Continue Coumadin for anticoagulation.  . Cancer [facility])    cancer Prostate- surgery only  . Chronic anticoagulation 09/10/2016  . History of DVT (deep vein thrombosis)   . History of DVT of lower extremity    left leg has some residual crculation issues  . History of prostate cancer   . Hyperlipidemia 09/09/2018  . Hypertension   . Hypertensive heart disease without CHF 09/29/2016  . Obesity (BMI 30-39.9)   . Paroxysmal atrial fibrillation (HCC)    CHA2DS2VASC score 2  . Sleep apnea    a. uses CPAP  . Sleep apnea in adult     Past Surgical History:  Procedure Laterality Date  . BALLOON DILATION N/A 10/12/2015   Procedure: BALLOON DILATION;  Surgeon: Garlan Fair, MD;  Location: [facility];  Service: Endoscopy;  Laterality: N/A;  . COLONOSCOPY W/ POLYPECTOMY     x2 colon polyps found  . COLONOSCOPY WITH PROPOFOL N/A 10/12/2015   Procedure: COLONOSCOPY WITH PROPOFOL;  Surgeon: Garlan Fair, MD;  Location: [facility];  Service: Endoscopy;  Laterality: N/A;  . ESOPHAGOGASTRODUODENOSCOPY (EGD) WITH  PROPOFOL N/A 10/12/2015   Procedure: ESOPHAGOGASTRODUODENOSCOPY (EGD) WITH PROPOFOL;  Surgeon: Garlan Fair, MD;  Location: [facility];  Service: Endoscopy;  Laterality: N/A;  . SHOULDER ARTHROTOMY     x2 procedures- 1 open, 1 scope.  . TONSILLECTOMY    . TRANSURETHRAL RESECTION OF PROSTATE      There were no vitals filed for this visit.   Subjective Assessment - 06/21/20 0928    Subjective Pt reports hip pain during sleep has continued to improved, Pt states having some L hip pain last night, but it resolved during the night. Pt reports no LBP at this time.    Currently in Pain? No/denies    Pain Score 0-No pain    Pain Location Hip    Pain Orientation Right;Left;Lateral    Pain Descriptors / Indicators Aching;Shooting    Pain Type Chronic pain    Pain Radiating Towards NA    Pain Onset 1 to 4 weeks ago   years   Pain Frequency Intermittent    Aggravating Factors  Sleeping on side    Pain Relieving Factors Gets better after waking    Effect of Pain on Daily Activities Disturbs sleep    Pain Score 0    Pain Location Back    Pain Orientation Posterior;Lower    Pain Descriptors / Indicators Aching    Pain Type Chronic pain  Pain Onset Other (comment)    Pain Frequency Intermittent              [facility] PT Assessment - 06/22/20 0001      Ambulation/Gait   Gait Comments Improved gait quality; improved pelvic stability, no opposite pelvis drop during SL stance                         [facility] Adult PT Treatment/Exercise - 06/22/20 0001      Self-Care   Self-Care Other Self-Care Comments    Other Self-Care Comments  Palloff press added to HEP; instruction in scapular retraction and shoulder ext c Tband for proper technique      Exercises   Exercises Lumbar;Knee/Hip      Lumbar Exercises: Stretches   Hip Flexor Stretch Right;Left;2 reps;30 seconds    Pelvic Tilt 15 reps    Pelvic Tilt Limitations 3 Sec; c bridging and hip add c ball    Piriformis  Stretch Right;Left;20 seconds;2 reps    Figure 4 Stretch 2 reps;20 seconds;With overpressure;Supine    Figure 4 Stretch Limitations For ER      Lumbar Exercises: Aerobic   Nustep L6; 7 mins; arms and legs      Lumbar Exercises: Supine   Other Supine Lumbar Exercises Abd mini crunch 15x                  PT Education - 06/22/20 0807    Person(s) Educated Patient    Methods Explanation;Demonstration;Verbal cues;Handout    Comprehension Verbalized understanding;Returned demonstration;Verbal cues required            PT Short Term Goals - 06/08/20 1629      PT SHORT TERM GOAL #1   Title Pt wil be ind in an initial HEP to address bilat hip strength, ROM, and pain    Status On-going      PT SHORT TERM GOAL #2   Title Pt will voice or demonstrate measure to assist in pain management of both hips    Status On-going             PT Long Term Goals - 06/08/20 1629      PT LONG TERM GOAL #1   Title Pt will report being able to sleep 4 of 7 nights a week with interuption from hip pain    Baseline Nightly    Status On-going      PT LONG TERM GOAL #2   Title Pt wil be ind in an final HEP to address bilat hip strength, ROM, and pain    Status On-going                 Plan - 06/22/20 0809    Clinical Impression Statement Pt's quality of gait pattern is improved, opposite pelvis drop during single leg stance is WNLs. Hip pain at night is improved with pt managing positioning. Pt reports consistent completion with his HEP, and pain report for low back is better as wellas qualityof gait.    Personal Factors and Comorbidities Past/Current Experience;Comorbidity 2    Comorbidities obesity, sleep apnea,h/o compression fx of L1 with 30% loss of vertebral body height, Schmorl's node at inferior endplate of L2, multi-level degenerative disc and facet disease in lumbar spine, suggestion of osseous hemangiomas in L4 and L5, vetebral bodies.    Examination-Activity Limitations  Sleep    Stability/Clinical Decision Making Evolving/Moderate complexity    Clinical Decision Making Moderate  Rehab Potential Good    PT Frequency 2x / week    PT Duration 6 weeks    PT Treatment/Interventions Cryotherapy;Electrical Stimulation;Ultrasound;Moist Heat;Iontophoresis 4mg /ml Dexamethasone;Therapeutic activities;Therapeutic exercise;Manual techniques;Passive range of motion;Dry needling;Taping;Spinal Manipulations;Joint Manipulations    PT Next Visit Plan Complete reassessment, including FOTO    PT Home Exercise Plan [id number]    Consulted and Agree with Plan of Care Patient           Patient will benefit from skilled therapeutic intervention in order to improve the following deficits and impairments:  Decreased strength, Postural dysfunction, Pain, Other (comment), Decreased range of motion, Obesity  Visit Diagnosis: Pain in left hip  Pain in right hip  Chronic midline low back pain without sciatica  Muscle weakness (generalized)  Other idiopathic scoliosis, thoracic region  Bursitis of both hips, unspecified bursa     Problem List Patient Active Problem List   Diagnosis Date Noted  . High risk medication use 09/17/2018  . Hyperlipidemia 09/09/2018  . Hypertensive heart disease without CHF 09/29/2016  . Obesity (BMI 30-39.9)   . History of DVT (deep vein thrombosis)   . Sleep apnea in adult   . History of prostate cancer   . Chronic anticoagulation 09/10/2016  . Paroxysmal atrial fibrillation [facility])     Gar Ponto MS, PT 06/22/20 8:19 AM  [place], [place], [address] Phone: [phone]   Fax:  [phone]  Name: [name] MRN: [id number] Date of Birth: Sep 18, 1951

## 2020-06-24 ENCOUNTER — Other Ambulatory Visit: Payer: Self-pay

## 2020-06-24 ENCOUNTER — Ambulatory Visit
Admission: RE | Admit: 2020-06-24 | Discharge: 2020-06-24 | Disposition: A | Discharge status: Home / Self Care | Payer: Medicare Other | Source: Ambulatory Visit | Attending: Internal Medicine | Admitting: Internal Medicine

## 2020-06-24 DIAGNOSIS — M810 Age-related osteoporosis without current pathological fracture: Secondary | ICD-10-CM

## 2020-06-24 DIAGNOSIS — Z13820 Encounter for screening for osteoporosis: Secondary | ICD-10-CM | POA: Diagnosis not present

## 2020-06-25 ENCOUNTER — Ambulatory Visit: Payer: Medicare Other

## 2020-06-25 DIAGNOSIS — M545 Low back pain, unspecified: Secondary | ICD-10-CM

## 2020-06-25 DIAGNOSIS — M4124 Other idiopathic scoliosis, thoracic region: Secondary | ICD-10-CM | POA: Diagnosis not present

## 2020-06-25 DIAGNOSIS — M25552 Pain in left hip: Secondary | ICD-10-CM

## 2020-06-25 DIAGNOSIS — G8929 Other chronic pain: Secondary | ICD-10-CM

## 2020-06-25 DIAGNOSIS — M6281 Muscle weakness (generalized): Secondary | ICD-10-CM

## 2020-06-25 DIAGNOSIS — M25551 Pain in right hip: Secondary | ICD-10-CM

## 2020-06-25 DIAGNOSIS — M7071 Other bursitis of hip, right hip: Secondary | ICD-10-CM

## 2020-06-25 DIAGNOSIS — M7072 Other bursitis of hip, left hip: Secondary | ICD-10-CM

## 2020-06-26 NOTE — Therapy (Signed)
[place], [place], [address] Phone: [phone]   Fax:  [phone]  Physical Therapy Treatment  Patient Details  Name: [name] MRN: [id number] Date of Birth: 10-12-1951 Referring Provider (PT): Irven Shelling, MD   Encounter Date: 06/25/2020   PT End of Session - 06/26/20 0937    Visit Number 7    Number of Visits 13    Date for PT Re-Evaluation 08/30/20    Authorization Type MEDICARE PART A AND B; AARP    PT Start Time [phone]    PT Stop Time 0923    PT Time Calculation (min) 48 min    Activity Tolerance Patient tolerated treatment well    Behavior During Therapy [facility] for tasks assessed/performed           Past Medical History:  Diagnosis Date  . Arthritis    arthritis -back  . Atrial fibrillation ([place])    a. diagnosed 09/2016 - started on Cardizem CD and Flecainide. Continue Coumadin for anticoagulation.  . Cancer [facility])    cancer Prostate- surgery only  . Chronic anticoagulation 09/10/2016  . History of DVT (deep vein thrombosis)   . History of DVT of lower extremity    left leg has some residual crculation issues  . History of prostate cancer   . Hyperlipidemia 09/09/2018  . Hypertension   . Hypertensive heart disease without CHF 09/29/2016  . Obesity (BMI 30-39.9)   . Paroxysmal atrial fibrillation (HCC)    CHA2DS2VASC score 2  . Sleep apnea    a. uses CPAP  . Sleep apnea in adult     Past Surgical History:  Procedure Laterality Date  . BALLOON DILATION N/A 10/12/2015   Procedure: BALLOON DILATION;  Surgeon: Garlan Fair, MD;  Location: [facility];  Service: Endoscopy;  Laterality: N/A;  . COLONOSCOPY W/ POLYPECTOMY     x2 colon polyps found  . COLONOSCOPY WITH PROPOFOL N/A 10/12/2015   Procedure: COLONOSCOPY WITH PROPOFOL;  Surgeon: Garlan Fair, MD;  [facility]: [facility];  Service: Endoscopy;  Laterality: N/A;  . ESOPHAGOGASTRODUODENOSCOPY (EGD) WITH  PROPOFOL N/A 10/12/2015   Procedure: ESOPHAGOGASTRODUODENOSCOPY (EGD) WITH PROPOFOL;  Surgeon: Garlan Fair, MD;  [facility]: [facility];  Service: Endoscopy;  Laterality: N/A;  . SHOULDER ARTHROTOMY     x2 procedures- 1 open, 1 scope.  . TONSILLECTOMY    . TRANSURETHRAL RESECTION OF PROSTATE      There were no vitals filed for this visit.   Subjective Assessment - 06/25/20 0845    Subjective Pt reports no hip pain since the last PT session. Low back pain remains improved at 3/10    Currently in Pain? No/denies    Pain Score 0-No pain    Pain Location Hip    Pain Orientation Right;Left    Pain Descriptors / Indicators Aching    Pain Type Chronic pain    Pain Onset 1 to 4 weeks ago    Pain Frequency Intermittent    Pain Score 3    Pain Location Back    Pain Orientation Posterior;Lower    Pain Descriptors / Indicators Aching    Pain Type Chronic pain    Pain Onset Other (comment)   years   Pain Frequency Intermittent                             [facility] Adult PT Treatment/Exercise - 06/26/20 0001  Self-Care   Self-Care Other Self-Care Comments    Other Self-Care Comments  Education for mini squats and reviewed of palloff press. With going on vacation and sleeping in adifferent bed, recommended trying a small piece memory foam under a sheet to relieve hip pain and pressure if a different be causes a problem      Exercises   Exercises Lumbar;Knee/Hip      Lumbar Exercises: Stretches   Single Knee to Chest Stretch 20 seconds;2 reps    Hip Flexor Stretch Right;Left;2 reps;30 seconds    Pelvic Tilt 15 reps    Pelvic Tilt Limitations 3 Sec; c bridging and hip add c ball    Piriformis Stretch Right;Left;20 seconds;2 reps    Figure 4 Stretch 2 reps;20 seconds;With overpressure;Supine    Figure 4 Stretch Limitations For ER      Lumbar Exercises: Aerobic   Nustep L6; 7 mins; arms and legs      Lumbar Exercises: Standing   Wall Slides 10 reps;2 seconds     Wall Slides Limitations 2 sets    Scapular Retraction Strengthening;Both;15 reps;Theraband    Theraband Level (Scapular Retraction) Level 4 (Blue)    Scapular Retraction Limitations 2 sets    Shoulder Extension Strengthening;Both;15 reps;Theraband    Theraband Level (Shoulder Extension) Level 4 (Blue)    Other Standing Lumbar Exercises Palloff press 15x each direction; Blue Tband      Lumbar Exercises: Supine   Other Supine Lumbar Exercises Abd mini crunch 15x                  PT Education - 06/26/20 0931    Education Details Education for mini squats and reviewed of palloff press. With going on vacation and sleeping in adifferent bed, recommended trying a small piece memory foam under a sheet to relieve hip pain and pressure if a different be causes a problem.    Person(s) Educated Patient    Methods Explanation;Demonstration;Tactile cues;Verbal cues;Handout    Comprehension Verbalized understanding;Returned demonstration;Verbal cues required;Tactile cues required            PT Short Term Goals - 06/08/20 1629      PT SHORT TERM GOAL #1   Title Pt wil be ind in an initial HEP to address bilat hip strength, ROM, and pain    Status On-going      PT SHORT TERM GOAL #2   Title Pt will voice or demonstrate measure to assist in pain management of both hips    Status On-going             PT Long Term Goals - 06/08/20 1629      PT LONG TERM GOAL #1   Title Pt will report being able to sleep 4 of 7 nights a week with interuption from hip pain    Baseline Nightly    Status On-going      PT LONG TERM GOAL #2   Title Pt wil be ind in an final HEP to address bilat hip strength, ROM, and pain    Status On-going                 Plan - 06/26/20 4580    Clinical Impression Statement Pt's condition continues to be improved. The quality of movement patterns during ther ex, walking, STS, and supine to/from sitting are smooth and controlled.    Personal Factors and  Comorbidities Past/Current Experience;Comorbidity 2    Comorbidities obesity, sleep apnea,h/o compression fx of L1 with 30%  loss of vertebral body height, Schmorl's node at inferior endplate of L2, multi-level degenerative disc and facet disease in lumbar spine, suggestion of osseous hemangiomas in L4 and L5, vetebral bodies.    Examination-Activity Limitations Sleep    Stability/Clinical Decision Making Evolving/Moderate complexity    Clinical Decision Making Moderate    Rehab Potential Good    PT Frequency 2x / week    PT Duration 6 weeks    PT Treatment/Interventions Cryotherapy;Electrical Stimulation;Ultrasound;Iontophoresis 4mg /ml Dexamethasone;Therapeutic activities;Therapeutic exercise;Manual techniques;Passive range of motion;Dry needling;Taping;Spinal Manipulations;Joint Manipulations    PT Next Visit Plan Complete reassessment, including FOTO. If pt's symptoms continue to do well during his week of vacation, will dscuss possible DC from PT.    PT Home Exercise Plan [id number]    Consulted and Agree with Plan of Care Patient           Patient will benefit from skilled therapeutic intervention in order to improve the following deficits and impairments:  Decreased strength, Postural dysfunction, Pain, Other (comment), Decreased range of motion, Obesity  Visit Diagnosis: Pain in left hip  Pain in right hip  Chronic midline low back pain without sciatica  Muscle weakness (generalized)  Other idiopathic scoliosis, thoracic region  Bursitis of both hips, unspecified bursa     Problem List Patient Active Problem List   Diagnosis Date Noted  . High risk medication use 09/17/2018  . Hyperlipidemia 09/09/2018  . Hypertensive heart disease without CHF 09/29/2016  . Obesity (BMI 30-39.9)   . History of DVT (deep vein thrombosis)   . Sleep apnea in adult   . History of prostate cancer   . Chronic anticoagulation 09/10/2016  . Paroxysmal atrial fibrillation [facility])     Gar Ponto MS, PT 06/26/20 9:48 AM  [facility] [address] [place], [place], [address] Phone: [phone]   Fax:  [phone]  Name: [name] MRN: [id number] Date of Birth: 04/19/1951

## 2020-07-07 ENCOUNTER — Other Ambulatory Visit: Payer: Self-pay

## 2020-07-07 ENCOUNTER — Ambulatory Visit: Payer: Medicare Other | Attending: Internal Medicine

## 2020-07-07 DIAGNOSIS — M25551 Pain in right hip: Secondary | ICD-10-CM | POA: Diagnosis not present

## 2020-07-07 DIAGNOSIS — M7071 Other bursitis of hip, right hip: Secondary | ICD-10-CM | POA: Diagnosis not present

## 2020-07-07 DIAGNOSIS — M6281 Muscle weakness (generalized): Secondary | ICD-10-CM | POA: Diagnosis not present

## 2020-07-07 DIAGNOSIS — M545 Low back pain: Secondary | ICD-10-CM | POA: Insufficient documentation

## 2020-07-07 DIAGNOSIS — G8929 Other chronic pain: Secondary | ICD-10-CM

## 2020-07-07 DIAGNOSIS — M25552 Pain in left hip: Secondary | ICD-10-CM

## 2020-07-07 DIAGNOSIS — M4124 Other idiopathic scoliosis, thoracic region: Secondary | ICD-10-CM

## 2020-07-07 DIAGNOSIS — M7072 Other bursitis of hip, left hip: Secondary | ICD-10-CM | POA: Insufficient documentation

## 2020-07-07 NOTE — Therapy (Signed)
[place], [place], [address] Phone: [phone]   Fax:  [phone]  Physical Therapy Treatment/Discharge Summary  Patient Details  Name: [name] MRN: [id number] Date of Birth: Oct 13, 1951 Referring Provider (PT): Irven Shelling, MD   Encounter Date: 07/07/2020   PT End of Session - 07/07/20 1342    Visit Number 8    Number of Visits 13    Authorization Type MEDICARE PART A AND B; AARP    PT Start Time 0831    PT Stop Time 0920    PT Time Calculation (min) 49 min    Activity Tolerance Patient tolerated treatment well    Behavior During Therapy [facility] for tasks assessed/performed           Past Medical History:  Diagnosis Date  . Arthritis    arthritis -back  . Atrial fibrillation ([place])    a. diagnosed 09/2016 - started on Cardizem CD and Flecainide. Continue Coumadin for anticoagulation.  . Cancer [facility])    cancer Prostate- surgery only  . Chronic anticoagulation 09/10/2016  . History of DVT (deep vein thrombosis)   . History of DVT of lower extremity    left leg has some residual crculation issues  . History of prostate cancer   . Hyperlipidemia 09/09/2018  . Hypertension   . Hypertensive heart disease without CHF 09/29/2016  . Obesity (BMI 30-39.9)   . Paroxysmal atrial fibrillation (HCC)    CHA2DS2VASC score 2  . Sleep apnea    a. uses CPAP  . Sleep apnea in adult     Past Surgical History:  Procedure Laterality Date  . BALLOON DILATION N/A 10/12/2015   Procedure: BALLOON DILATION;  Surgeon: Garlan Fair, MD;  Location: [facility];  Service: Endoscopy;  Laterality: N/A;  . COLONOSCOPY W/ POLYPECTOMY     x2 colon polyps found  . COLONOSCOPY WITH PROPOFOL N/A 10/12/2015   Procedure: COLONOSCOPY WITH PROPOFOL;  Surgeon: Garlan Fair, MD;  Location: [facility];  Service: Endoscopy;  Laterality: N/A;  . ESOPHAGOGASTRODUODENOSCOPY (EGD) WITH PROPOFOL N/A 10/12/2015    Procedure: ESOPHAGOGASTRODUODENOSCOPY (EGD) WITH PROPOFOL;  Surgeon: Garlan Fair, MD;  Location: [facility];  Service: Endoscopy;  Laterality: N/A;  . SHOULDER ARTHROTOMY     x2 procedures- 1 open, 1 scope.  . TONSILLECTOMY    . TRANSURETHRAL RESECTION OF PROSTATE      There were no vitals filed for this visit.   Subjective Assessment - 07/07/20 0842    Subjective Pt reports his hip pain botheredhim initially on vacation sleeping on a hard mattress where he was staying. He reports purchasing an air bed for which he could adjust the inflation, and his hip pain was better. With returning home to his own bed, pt reports he is sleeping better with no or little pain. Pt reports his low back pain remains improved from 6/10 initially to 3/10.    Limitations Other (comment)    How long can you sit comfortably? Not an issue c hips    How long can you stand comfortably? Not an issue c hips    How long can you walk comfortably? Not an issue c hips    Diagnostic tests None or none available    Patient Stated Goals To sleep better, my back to stop hurting    Currently in Pain? No/denies    Pain Score 0-No pain    Pain Orientation Right;Left    Pain Onset 1 to 4 weeks  ago    Pain Frequency Intermittent    Aggravating Factors  Sleeping on side    Pain Relieving Factors Abates once he gets up.    Effect of Pain on Daily Activities Affects sleep less    Pain Score 3    Pain Location Back    Pain Orientation Posterior;Lower    Pain Descriptors / Indicators Aching    Pain Type Chronic pain    Pain Onset Other (comment)   years   Pain Frequency Constant                             [facility] Adult PT Treatment/Exercise - 07/07/20 0001      Self-Care   Self-Care Other Self-Care Comments    Other Self-Care Comments  See education      Exercises   Exercises Lumbar;Knee/Hip      Lumbar Exercises: Aerobic   Nustep L6; 10 mins; arms and legs                  PT  Education - 07/07/20 1336    Education Details Discussed options for hip pain management when travelling and sleeping of different mattresses, one being a smaller memory foam cushion to place at level of hips and the other being sleeping on his back with 1-2 pillows under his knees. Per pt's request, all of his home exercises were reviewed with emphasis on the purpose of each ex. Fater which, pt voiced understanding    Person(s) Educated Patient    Methods Explanation;Demonstration    Comprehension Verbalized understanding            PT Short Term Goals - 07/07/20 1403      PT SHORT TERM GOAL #1   Title Pt wil be ind in an initial HEP to address bilat hip strength, ROM, and pain. Achieved    Status Achieved      PT SHORT TERM GOAL #2   Title Pt will voice or demonstrate measure to assist in pain management of both hips. Achieved    Status Achieved             PT Long Term Goals - 07/07/20 1404      PT LONG TERM GOAL #1   Title Pt will report being able to sleep 4 of 7 nights a week with interuption from hip pain. Achieved-With sleeping in hs own bed, pt has reported being pain free with his hips the majority of the time.    Baseline Nightly    Status Achieved      PT LONG TERM GOAL #2   Title Pt wil be ind in an final HEP to address bilat hip strength, ROM, and pain. Achieved    Status Achieved                 Plan - 07/07/20 1345    Clinical Impression Statement Pt returns from vacation with a hard mattress on the location causing his hips to be painful. Pt purchased an adjusted air bed which helped to reduce the hip pain so he could sleep more comfortably. Wih the pt's hip issues only occuring at night, not caused by physical movements or reproduced c a musculoskeletal assessment, it appears his hip pain is related to prolonged pressure from sleeping on his sides. Increasing the softness of his home mattress has helped to greatly relieve the hip pain and allow him to  sleep more comfortably. HEP  was developed to address hip/back pain has been beneficial in reducing his low back pain from 6 to 3/10.    Comorbidities obesity, sleep apnea,h/o compression fx of L1 with 30% loss of vertebral body height, Schmorl's node at inferior endplate of L2, multi-level degenerative disc and facet disease in lumbar spine, suggestion of osseous hemangiomas in L4 and L5, vetebral bodies.    Examination-Activity Limitations Sleep    Stability/Clinical Decision Making Evolving/Moderate complexity    Clinical Decision Making Moderate    Rehab Potential Good    PT Frequency 2x / week    PT Duration 6 weeks    PT Treatment/Interventions Cryotherapy;Electrical Stimulation;Ultrasound;Iontophoresis 61m/ml Dexamethasone;Therapeutic activities;Therapeutic exercise;Manual techniques;Passive range of motion;Dry needling;Taping;Spinal Manipulations;Joint Manipulations    PT Home Exercise Plan [id number]    Consulted and Agree with Plan of Care Patient           Patient will benefit from skilled therapeutic intervention in order to improve the following deficits and impairments:  Decreased strength, Postural dysfunction, Pain, Other (comment), Decreased range of motion, Obesity  Visit Diagnosis: Pain in left hip  Pain in right hip  Chronic midline low back pain without sciatica  Muscle weakness (generalized)  Other idiopathic scoliosis, thoracic region  Bursitis of both hips, unspecified bursa     Problem List Patient Active Problem List   Diagnosis Date Noted  . High risk medication use 09/17/2018  . Hyperlipidemia 09/09/2018  . Hypertensive heart disease without CHF 09/29/2016  . Obesity (BMI 30-39.9)   . History of DVT (deep vein thrombosis)   . Sleep apnea in adult   . History of prostate cancer   . Chronic anticoagulation 09/10/2016  . Paroxysmal atrial fibrillation ([place]     AGar PontoMS, PT 07/07/20 2:28 PM   PHYSICAL THERAPY DISCHARGE SUMMARY  Visits  from Start of Care: 8  Current functional level related to goals / functional outcomes: See above   Remaining deficits: See above   Education / Equipment: HEP Plan: Patient agrees to discharge.  Patient goals were met. Patient is being discharged due to meeting the stated rehab goals.  ?????       [place] [address]Phone: [phone]  Fax:  [phone] Name: SLuisenrique ConranMRN: [id number] of Birth: 112/27/1952

## 2020-07-09 ENCOUNTER — Ambulatory Visit: Payer: Medicare Other

## 2020-07-13 ENCOUNTER — Ambulatory Visit: Payer: Medicare Other | Admitting: Physical Therapy

## 2020-07-15 ENCOUNTER — Ambulatory Visit: Payer: Medicare Other | Admitting: Physical Therapy

## 2020-07-19 DIAGNOSIS — Z8546 Personal history of malignant neoplasm of prostate: Secondary | ICD-10-CM | POA: Diagnosis not present

## 2020-07-19 DIAGNOSIS — I4891 Unspecified atrial fibrillation: Secondary | ICD-10-CM | POA: Diagnosis not present

## 2020-07-19 DIAGNOSIS — M810 Age-related osteoporosis without current pathological fracture: Secondary | ICD-10-CM | POA: Diagnosis not present

## 2020-07-19 DIAGNOSIS — I10 Essential (primary) hypertension: Secondary | ICD-10-CM | POA: Diagnosis not present

## 2020-07-19 DIAGNOSIS — Q613 Polycystic kidney, unspecified: Secondary | ICD-10-CM | POA: Diagnosis not present

## 2020-07-19 DIAGNOSIS — I2510 Atherosclerotic heart disease of native coronary artery without angina pectoris: Secondary | ICD-10-CM | POA: Diagnosis not present

## 2020-07-19 DIAGNOSIS — I480 Paroxysmal atrial fibrillation: Secondary | ICD-10-CM | POA: Diagnosis not present

## 2020-07-19 DIAGNOSIS — M1811 Unilateral primary osteoarthritis of first carpometacarpal joint, right hand: Secondary | ICD-10-CM | POA: Diagnosis not present

## 2020-07-27 ENCOUNTER — Telehealth: Payer: Self-pay | Admitting: Cardiology

## 2020-07-27 NOTE — Telephone Encounter (Signed)
Okay 

## 2020-07-27 NOTE — Telephone Encounter (Signed)
Patient is requesting to switch from Dr. Bettina Gavia to Dr. Gardiner Rhyme.

## 2020-07-28 NOTE — Telephone Encounter (Signed)
OK 

## 2020-07-29 DIAGNOSIS — K5909 Other constipation: Secondary | ICD-10-CM | POA: Diagnosis not present

## 2020-08-03 DIAGNOSIS — Z8546 Personal history of malignant neoplasm of prostate: Secondary | ICD-10-CM | POA: Diagnosis not present

## 2020-08-03 DIAGNOSIS — Z1389 Encounter for screening for other disorder: Secondary | ICD-10-CM | POA: Diagnosis not present

## 2020-08-03 DIAGNOSIS — Z8679 Personal history of other diseases of the circulatory system: Secondary | ICD-10-CM | POA: Diagnosis not present

## 2020-08-03 DIAGNOSIS — Z86718 Personal history of other venous thrombosis and embolism: Secondary | ICD-10-CM | POA: Diagnosis not present

## 2020-08-03 DIAGNOSIS — G4733 Obstructive sleep apnea (adult) (pediatric): Secondary | ICD-10-CM | POA: Diagnosis not present

## 2020-08-03 DIAGNOSIS — I480 Paroxysmal atrial fibrillation: Secondary | ICD-10-CM | POA: Diagnosis not present

## 2020-08-03 DIAGNOSIS — Q613 Polycystic kidney, unspecified: Secondary | ICD-10-CM | POA: Diagnosis not present

## 2020-08-03 DIAGNOSIS — M810 Age-related osteoporosis without current pathological fracture: Secondary | ICD-10-CM | POA: Diagnosis not present

## 2020-08-03 DIAGNOSIS — Z0000 Encounter for general adult medical examination without abnormal findings: Secondary | ICD-10-CM | POA: Diagnosis not present

## 2020-08-03 DIAGNOSIS — I10 Essential (primary) hypertension: Secondary | ICD-10-CM | POA: Diagnosis not present

## 2020-08-03 DIAGNOSIS — I700 Atherosclerosis of aorta: Secondary | ICD-10-CM | POA: Diagnosis not present

## 2020-08-03 DIAGNOSIS — R1319 Other dysphagia: Secondary | ICD-10-CM | POA: Diagnosis not present

## 2020-08-03 DIAGNOSIS — I2510 Atherosclerotic heart disease of native coronary artery without angina pectoris: Secondary | ICD-10-CM | POA: Diagnosis not present

## 2020-08-03 DIAGNOSIS — Z86010 Personal history of colonic polyps: Secondary | ICD-10-CM | POA: Diagnosis not present

## 2020-08-03 DIAGNOSIS — K5909 Other constipation: Secondary | ICD-10-CM | POA: Diagnosis not present

## 2020-08-03 DIAGNOSIS — E291 Testicular hypofunction: Secondary | ICD-10-CM | POA: Diagnosis not present

## 2020-08-21 NOTE — Progress Notes (Signed)
Cardiology Office Note:    Date:  08/25/2020   ID:  [name], DOB 1951-09-12, MRN [id number]  PCP:  Lavone Orn, MD  Cardiologist:  No primary care provider on file.  Electrophysiologist:  None   Referring MD: Lavone Orn, MD   Chief Complaint  Patient presents with  . Atrial Fibrillation    History of Present Illness:    [name] is a 69 y.o. male with a hx of paroxysmal atrial fibrillation, hypertension, OSA, DVT, prostate cancer, PAD, hyperlipidemia who presents for follow-up.  He has had issues with recurrent venous thromboembolism, currently on Eliquis.  He was admitted to [facility] in October 2017 with atrial fibrillation.  He converted to sinus rhythm during admission and was started on flecainide and diltiazem.  Echocardiogram at that time showed normal LV function, moderate to severe left atrial dilatation, no significant valvular disease.  He spends 6 months/year in [place] and reports that last year established with a cardiologist in [place].  Stress test was done, though patient denied having any symptoms.  Stress test was abnormal and underwent cardiac catheterization which showed 60 to 70% stenosis of D1.  Medical management was recommended.  Reports that in August he had his first recurrence of atrial fibrillation since 2017.  States that he went into AF for about 1 hour with heart rate in 150s.  Converted spontaneously.  Reports that he is active, walks 2.5 miles per day.  Denies any exertional symptoms.  Does report rare resting chest pain that he describes as left-sided sharp pain, but no exertional chest pain.  Denies any lightheadedness or syncope.  Reports that he smoked for 20 years, up to 2 packs/day but quit in 2000.  No known history of heart disease in his immediate family.    Past Medical History:  Diagnosis Date  . Arthritis    arthritis -back  . Atrial fibrillation ([place])    a. diagnosed 09/2016 - started on Cardizem CD and Flecainide. Continue  Coumadin for anticoagulation.  . Cancer [facility])    cancer Prostate- surgery only  . Chronic anticoagulation 09/10/2016  . History of DVT (deep vein thrombosis)   . History of DVT of lower extremity    left leg has some residual crculation issues  . History of prostate cancer   . Hyperlipidemia 09/09/2018  . Hypertension   . Hypertensive heart disease without CHF 09/29/2016  . Obesity (BMI 30-39.9)   . Paroxysmal atrial fibrillation (HCC)    CHA2DS2VASC score 2  . Sleep apnea    a. uses CPAP  . Sleep apnea in adult     Past Surgical History:  Procedure Laterality Date  . BALLOON DILATION N/A 10/12/2015   Procedure: BALLOON DILATION;  Surgeon: Garlan Fair, MD;  Location: [facility];  Service: Endoscopy;  Laterality: N/A;  . COLONOSCOPY W/ POLYPECTOMY     x2 colon polyps found  . COLONOSCOPY WITH PROPOFOL N/A 10/12/2015   Procedure: COLONOSCOPY WITH PROPOFOL;  Surgeon: Garlan Fair, MD;  Location: [facility];  Service: Endoscopy;  Laterality: N/A;  . ESOPHAGOGASTRODUODENOSCOPY (EGD) WITH PROPOFOL N/A 10/12/2015   Procedure: ESOPHAGOGASTRODUODENOSCOPY (EGD) WITH PROPOFOL;  Surgeon: Garlan Fair, MD;  Location: [facility];  Service: Endoscopy;  Laterality: N/A;  . SHOULDER ARTHROTOMY     x2 procedures- 1 open, 1 scope.  . TONSILLECTOMY    . TRANSURETHRAL RESECTION OF PROSTATE      Current Medications: Current Meds  Medication Sig  . apixaban (ELIQUIS) 5 MG TABS tablet Take  5 mg by mouth 2 (two) times daily.  Marland Kitchen atorvastatin (LIPITOR) 10 MG tablet Take 10 mg by mouth daily.  . calcium-vitamin D (OSCAL WITH D) 500-200 MG-UNIT per tablet Take 1 tablet by mouth 2 (two) times daily with a meal.   . cholecalciferol (VITAMIN D) 1000 UNITS tablet Take 1,000 Units by mouth 2 (two) times daily with a meal.  . Coenzyme Q10 (COQ-10) 200 MG CAPS Take 1 capsule by mouth daily.  . cyclobenzaprine (FLEXERIL) 10 MG tablet Take 10 mg by mouth 3 (three) times daily as needed for muscle  spasms.  . FOLIC ACID PO Take 1,610 mcg by mouth daily.  Marland Kitchen lisinopril-hydrochlorothiazide (ZESTORETIC) 20-25 MG tablet Take 1 tablet by mouth daily.  . potassium chloride SA (K-DUR,KLOR-CON) 20 MEQ tablet Take 20 mEq by mouth daily with lunch.   . Testosterone 20.25 MG/ACT (1.62%) GEL 3 Pump daily.  . vitamin B-12 (CYANOCOBALAMIN) 1000 MCG tablet Take 1,000 mcg by mouth daily.  . [DISCONTINUED] flecainide (TAMBOCOR) 100 MG tablet Take 1 tablet (100 mg total) by mouth every 12 (twelve) hours.     Allergies:   Scallops [shellfish allergy]   Social History   Socioeconomic History  . Marital status: Married    Spouse name: Not on file  . Number of children: Not on file  . Years of education: Not on file  . Highest education level: Not on file  Occupational History  . Not on file  Tobacco Use  . Smoking status: Former Smoker    Years: 15.00    Types: Cigarettes    Quit date: 10/08/2011    Years since quitting: 8.8  . Smokeless tobacco: Never Used  Substance and Sexual Activity  . Alcohol use: No  . Drug use: No  . Sexual activity: Not on file  Other Topics Concern  . Not on file  Social History Narrative  . Not on file   Social Determinants of Health   Financial Resource Strain:   . Difficulty of Paying Living Expenses: Not on file  Food Insecurity:   . Worried About Charity fundraiser in the Last Year: Not on file  . Ran Out of Food in the Last Year: Not on file  Transportation Needs:   . Lack of Transportation (Medical): Not on file  . Lack of Transportation (Non-Medical): Not on file  Physical Activity:   . Days of Exercise per Week: Not on file  . Minutes of Exercise per Session: Not on file  Stress:   . Feeling of Stress : Not on file  Social Connections:   . Frequency of Communication with Friends and Family: Not on file  . Frequency of Social Gatherings with Friends and Family: Not on file  . Attends Religious Services: Not on file  . Active Member of Clubs  or Organizations: Not on file  . Attends Archivist Meetings: Not on file  . Marital Status: Not on file     Family History: The patient's family history includes Hypertension in his brother and sister; Prostate cancer in his brother.  ROS:   Please see the history of present illness.     All other systems reviewed and are negative.  EKGs/Labs/Other Studies Reviewed:    The following studies were reviewed today:   EKG:  EKG is ordered today.  The ekg ordered today demonstrates normal sinus rhythm, rate 60, no ST/T abnormalities.  Her fall was on  Recent Labs: No results found for requested labs within  last 8760 hours.  Recent Lipid Panel    Component Value Date/Time   CHOL 169 09/09/2016 0421   TRIG 77 09/09/2016 0421   HDL 49 09/09/2016 0421   CHOLHDL 3.4 09/09/2016 0421   VLDL 15 09/09/2016 0421   LDLCALC 105 (H) 09/09/2016 0421    Physical Exam:    VS:  BP 130/78   Pulse 60   Ht 6' (1.829 m)   Wt 226 lb 6.4 oz (102.7 kg)   BMI 30.71 kg/m     Wt Readings from Last 3 Encounters:  08/25/20 226 lb 6.4 oz (102.7 kg)  09/18/18 219 lb 6.4 oz (99.5 kg)  09/10/16 255 lb 12.8 oz (116 kg)     GEN:  Well nourished, well developed in no acute distress HEENT: Normal NECK: No JVD; No carotid bruits CARDIAC: RRR, no murmurs, rubs, gallops RESPIRATORY:  Clear to auscultation without rales, wheezing or rhonchi  ABDOMEN: Soft, non-tender, non-distended MUSCULOSKELETAL:  No edema; No deformity  SKIN: Warm and dry NEUROLOGIC:  Alert and oriented x 3 PSYCHIATRIC:  Normal affect   ASSESSMENT:    1. Paroxysmal atrial fibrillation (HCC)   2. Coronary artery disease involving native coronary artery of native heart without angina pectoris   3. Essential hypertension   4. PAD (peripheral artery disease) ([place])   5. Hyperlipidemia, unspecified hyperlipidemia type    PLAN:    Paroxysmal atrial fibrillation/flutter: CHA2DS2-VASc score 3 (hypertension, age, PAD)  echocardiogram 09/10/2016 shows normal LV systolic function, no significant valvular disease, moderate to severe left atrial dilatation.  Recently had episode that from rhythm strip appears likely 2:1 atrial flutter with rate 150, lasted for about 1 hour and converted spontaneously. -Continue Eliquis 5 mg twice daily -Continue diltiazem, given elevated heart rate during recent episode will increase dose to 180 mg daily -Has been on flecainide, but given his CAD, would recommend discontinuing flecainide.  If he is having recurrent A. fib off flecainide, can consider other antiarrhythmic medication options versus ablation -Will check echocardiogram  CAD: Catheterization in [place] last year showed 60 to 70% D1 stenosis.  Managing medically.  Currently no anginal symptoms. -Continue Eliquis and statin.  LDL at goal less than 70.  Hypertension: On lisinopril-hydrochlorothiazide 20-12.5 mg daily and diltiazem 120 mg daily.  Increasing diltiazem as above.  PAD: Continue Eliquis and statin  Hyperlipidemia: On atorvastatin 10 mg daily, at goal LDL less than 70  DVT: History of recurrent venous thromboembolism.  Continue Eliquis  OSA: Continue CPAP.   RTC in 6 weeks  Medication Adjustments/Labs and Tests Ordered: Current medicines are reviewed at length with the patient today.  Concerns regarding medicines are outlined above.  Orders Placed This Encounter  Procedures  . EKG 12-Lead  . ECHOCARDIOGRAM COMPLETE   Meds ordered this encounter  Medications  . diltiazem (CARDIZEM CD) 180 MG 24 hr capsule    Sig: Take 1 capsule (180 mg total) by mouth daily.    Dispense:  90 capsule    Refill:  3    Patient Instructions  Medication Instructions:  Stop Flecinide  Increase Diltiazem 180 mg daily   *If you need a refill on your cardiac medications before your next appointment, please call your pharmacy*   Testing/Procedures: Echocardiogram - Your physician has requested that you have an  echocardiogram. Echocardiography is a painless test that uses sound waves to create images of your heart. It provides your doctor with information about the size and shape of your heart and how well your heart's  chambers and valves are working. This procedure takes approximately one hour. There are no restrictions for this procedure. This will be performed at our [facility] location - [address].    Follow-Up: At [facility], you and your health needs are our priority.  As part of our continuing mission to provide you with exceptional heart care, we have created designated Provider Care Teams.  These Care Teams include your primary Cardiologist (physician) and Advanced Practice Providers (APPs -  Physician Assistants and Nurse Practitioners) who all work together to provide you with the care you need, when you need it.  We recommend signing up for the patient portal called "MyChart".  Sign up information is provided on this After Visit Summary.  MyChart is used to connect with patients for Virtual Visits (Telemedicine).  Patients are able to view lab/test results, encounter notes, upcoming appointments, etc.  Non-urgent messages can be sent to your provider as well.   To learn more about what you can do with MyChart, go to [email or web address].    Your next appointment:   1st week in November  The format for your next appointment:   In Person  Provider:   Oswaldo Milian, MD        Signed, Donato Heinz, MD  08/25/2020 7:49 PM    [place]

## 2020-08-24 DIAGNOSIS — Z1159 Encounter for screening for other viral diseases: Secondary | ICD-10-CM | POA: Diagnosis not present

## 2020-08-25 ENCOUNTER — Other Ambulatory Visit: Payer: Self-pay

## 2020-08-25 ENCOUNTER — Encounter: Payer: Self-pay | Admitting: Cardiology

## 2020-08-25 ENCOUNTER — Telehealth: Payer: Self-pay | Admitting: Cardiology

## 2020-08-25 ENCOUNTER — Ambulatory Visit (INDEPENDENT_AMBULATORY_CARE_PROVIDER_SITE_OTHER): Payer: Medicare Other | Admitting: Cardiology

## 2020-08-25 VITALS — BP 130/78 | HR 60 | Ht 72.0 in | Wt 226.4 lb

## 2020-08-25 DIAGNOSIS — I2510 Atherosclerotic heart disease of native coronary artery without angina pectoris: Secondary | ICD-10-CM | POA: Diagnosis not present

## 2020-08-25 DIAGNOSIS — I739 Peripheral vascular disease, unspecified: Secondary | ICD-10-CM | POA: Diagnosis not present

## 2020-08-25 DIAGNOSIS — I10 Essential (primary) hypertension: Secondary | ICD-10-CM | POA: Diagnosis not present

## 2020-08-25 DIAGNOSIS — I480 Paroxysmal atrial fibrillation: Secondary | ICD-10-CM

## 2020-08-25 DIAGNOSIS — E785 Hyperlipidemia, unspecified: Secondary | ICD-10-CM | POA: Diagnosis not present

## 2020-08-25 MED ORDER — DILTIAZEM HCL ER COATED BEADS 180 MG PO CP24
180.0000 mg | ORAL_CAPSULE | Freq: Every day | ORAL | 3 refills | Status: DC
Start: 2020-08-25 — End: 2020-08-26

## 2020-08-25 NOTE — Telephone Encounter (Addendum)
Spoke with pt, he reports the pharmacy told him the insurance company does not cover the diltiazem and needs a prior authorization with [employer].

## 2020-08-25 NOTE — Telephone Encounter (Signed)
Pt c/o medication issue:  1. Name of Medication: diltiazem (CARDIZEM CD) 180 MG 24 hr capsule  2. How are you currently taking this medication (dosage and times per day)? n/a  3. Are you having a reaction (difficulty breathing--STAT)? no  4. What is your medication issue? Patient states that there is a problem at the drugstore getting this medication. He would like to discuss further with Dr. Newman Nickels nurse.

## 2020-08-25 NOTE — Patient Instructions (Signed)
Medication Instructions:  Stop Flecinide  Increase Diltiazem 180 mg daily   *If you need a refill on your cardiac medications before your next appointment, please call your pharmacy*   Testing/Procedures: Echocardiogram - Your physician has requested that you have an echocardiogram. Echocardiography is a painless test that uses sound waves to create images of your heart. It provides your doctor with information about the size and shape of your heart and how well your heart's chambers and valves are working. This procedure takes approximately one hour. There are no restrictions for this procedure. This will be performed at our [facility] location - [address], [address].    Follow-Up: At [facility], you and your health needs are our priority.  As part of our continuing mission to provide you with exceptional heart care, we have created designated Provider Care Teams.  These Care Teams include your primary Cardiologist (physician) and Advanced Practice Providers (APPs -  Physician Assistants and Nurse Practitioners) who all work together to provide you with the care you need, when you need it.  We recommend signing up for the patient portal called "MyChart".  Sign up information is provided on this After Visit Summary.  MyChart is used to connect with patients for Virtual Visits (Telemedicine).  Patients are able to view lab/test results, encounter notes, upcoming appointments, etc.  Non-urgent messages can be sent to your provider as well.   To learn more about what you can do with MyChart, go to [email or web address].    Your next appointment:   1st week in November  The format for your next appointment:   In Person  Provider:   Oswaldo Milian, MD

## 2020-08-26 MED ORDER — DILTIAZEM HCL ER BEADS 180 MG PO CP24: 180.0000 mg | ORAL_CAPSULE | Freq: Every day | ORAL | 3 refills | Status: DC

## 2020-08-26 NOTE — Telephone Encounter (Signed)
Attempt to complete PA PA not required for diltiazem 180 mg daily   Called pharmacy to clarify-insurance will not cover this diltiazem but will cover "diltiazem 24 hour release".      New rx sent in for covered diltiazem.

## 2020-08-27 DIAGNOSIS — K635 Polyp of colon: Secondary | ICD-10-CM | POA: Diagnosis not present

## 2020-08-27 DIAGNOSIS — R1310 Dysphagia, unspecified: Secondary | ICD-10-CM | POA: Diagnosis not present

## 2020-08-27 DIAGNOSIS — D120 Benign neoplasm of cecum: Secondary | ICD-10-CM | POA: Diagnosis not present

## 2020-08-27 DIAGNOSIS — K222 Esophageal obstruction: Secondary | ICD-10-CM | POA: Diagnosis not present

## 2020-08-27 DIAGNOSIS — Z86010 Personal history of colonic polyps: Secondary | ICD-10-CM | POA: Diagnosis not present

## 2020-08-27 DIAGNOSIS — K317 Polyp of stomach and duodenum: Secondary | ICD-10-CM | POA: Diagnosis not present

## 2020-08-27 DIAGNOSIS — K648 Other hemorrhoids: Secondary | ICD-10-CM | POA: Diagnosis not present

## 2020-08-27 MED ORDER — DILTIAZEM HCL ER 180 MG PO CP24: 180.0000 mg | ORAL_CAPSULE | Freq: Every day | ORAL | 3 refills | Status: DC

## 2020-08-27 NOTE — Addendum Note (Signed)
Addended by: Patria Mane A on: 08/27/2020 03:24 PM   Modules accepted: Orders

## 2020-08-27 NOTE — Telephone Encounter (Signed)
Patient called in stating pharmacy still states his diltiazem is still not covered.     Called and spoke to pharmacist, discussed with our pharmD as well.   Generic RX sent and approved by insurance per pharmD at [facility].     Patient made aware.

## 2020-08-31 ENCOUNTER — Other Ambulatory Visit: Payer: Self-pay

## 2020-08-31 DIAGNOSIS — Z23 Encounter for immunization: Secondary | ICD-10-CM | POA: Diagnosis not present

## 2020-09-02 DIAGNOSIS — K317 Polyp of stomach and duodenum: Secondary | ICD-10-CM | POA: Diagnosis not present

## 2020-09-02 DIAGNOSIS — K635 Polyp of colon: Secondary | ICD-10-CM | POA: Diagnosis not present

## 2020-09-02 DIAGNOSIS — D120 Benign neoplasm of cecum: Secondary | ICD-10-CM | POA: Diagnosis not present

## 2020-09-03 ENCOUNTER — Other Ambulatory Visit: Payer: Self-pay

## 2020-09-03 ENCOUNTER — Encounter (HOSPITAL_COMMUNITY): Payer: Self-pay | Admitting: Physician Assistant

## 2020-09-03 ENCOUNTER — Ambulatory Visit (HOSPITAL_COMMUNITY)
Admission: RE | Admit: 2020-09-03 | Discharge: 2020-09-03 | Disposition: A | Discharge status: Home / Self Care | Payer: Medicare Other | Source: Ambulatory Visit | Attending: Physician Assistant | Admitting: Physician Assistant

## 2020-09-03 VITALS — BP 116/60 | HR 68 | Ht 72.0 in | Wt 225.8 lb

## 2020-09-03 DIAGNOSIS — Z9989 Dependence on other enabling machines and devices: Secondary | ICD-10-CM | POA: Diagnosis not present

## 2020-09-03 DIAGNOSIS — Z6830 Body mass index (BMI) 30.0-30.9, adult: Secondary | ICD-10-CM | POA: Diagnosis not present

## 2020-09-03 DIAGNOSIS — D6869 Other thrombophilia: Secondary | ICD-10-CM | POA: Insufficient documentation

## 2020-09-03 DIAGNOSIS — Z8249 Family history of ischemic heart disease and other diseases of the circulatory system: Secondary | ICD-10-CM | POA: Insufficient documentation

## 2020-09-03 DIAGNOSIS — Z87891 Personal history of nicotine dependence: Secondary | ICD-10-CM | POA: Diagnosis not present

## 2020-09-03 DIAGNOSIS — I2510 Atherosclerotic heart disease of native coronary artery without angina pectoris: Secondary | ICD-10-CM | POA: Insufficient documentation

## 2020-09-03 DIAGNOSIS — E669 Obesity, unspecified: Secondary | ICD-10-CM | POA: Insufficient documentation

## 2020-09-03 DIAGNOSIS — I480 Paroxysmal atrial fibrillation: Secondary | ICD-10-CM | POA: Insufficient documentation

## 2020-09-03 DIAGNOSIS — Z7901 Long term (current) use of anticoagulants: Secondary | ICD-10-CM | POA: Insufficient documentation

## 2020-09-03 DIAGNOSIS — G4733 Obstructive sleep apnea (adult) (pediatric): Secondary | ICD-10-CM | POA: Diagnosis not present

## 2020-09-03 DIAGNOSIS — Z86718 Personal history of other venous thrombosis and embolism: Secondary | ICD-10-CM | POA: Insufficient documentation

## 2020-09-03 DIAGNOSIS — I119 Hypertensive heart disease without heart failure: Secondary | ICD-10-CM | POA: Insufficient documentation

## 2020-09-03 DIAGNOSIS — Z79899 Other long term (current) drug therapy: Secondary | ICD-10-CM | POA: Diagnosis not present

## 2020-09-03 LAB — COMPREHENSIVE METABOLIC PANEL
ALT: 18 U/L (ref 0–44)
AST: 17 U/L (ref 15–41)
Albumin: 3.9 g/dL (ref 3.5–5.0)
Alkaline Phosphatase: 60 U/L (ref 38–126)
Anion gap: 8 (ref 5–15)
BUN: 13 mg/dL (ref 8–23)
CO2: 25 mmol/L (ref 22–32)
Calcium: 9.3 mg/dL (ref 8.9–10.3)
Chloride: 99 mmol/L (ref 98–111)
Creatinine, Ser: 0.88 mg/dL (ref 0.61–1.24)
GFR calc Af Amer: >60 mL/min (ref >60)
GFR calc non Af Amer: >60 mL/min (ref >60)
Glucose, Bld: 105 mg/dL — ABNORMAL HIGH (ref 70–99)
Potassium: 4.2 mmol/L (ref 3.5–5.1)
Sodium: 132 mmol/L — ABNORMAL LOW (ref 135–145)
Total Bilirubin: 0.8 mg/dL (ref 0.3–1.2)
Total Protein: 6.4 g/dL — ABNORMAL LOW (ref 6.5–8.1)

## 2020-09-03 LAB — CBC
HCT: 49.1 % (ref 39.0–52.0)
Hemoglobin: 16.8 g/dL (ref 13.0–17.0)
MCH: 31.2 pg (ref 26.0–34.0)
MCHC: 34.2 g/dL (ref 30.0–36.0)
MCV: 91.3 fL (ref 80.0–100.0)
Platelets: 188 10*3/uL (ref 150–400)
RBC: 5.38 MIL/uL (ref 4.22–5.81)
RDW: 12.9 % (ref 11.5–15.5)
WBC: 6.1 10*3/uL (ref 4.0–10.5)
nRBC: 0 % (ref 0.0–0.2)

## 2020-09-03 MED ORDER — MULTAQ 400 MG PO TABS: 400.0000 mg | ORAL_TABLET | Freq: Two times a day (BID) | ORAL | 3 refills | Status: DC

## 2020-09-03 NOTE — Telephone Encounter (Signed)
Called patient to discuss.  Since stopping flecainide he is having episodes of Afib.   He is feeling very "wiped out" and tired.    This morning he got up his Jodelle Red was reading a irregular rhythm, denies palpitations, CP, SOB.   He just reports feeling "off".      Patient would like to be seen today to discuss.   Appt scheduled in [facility] at 11AM with [facility] PA.    Patient aware and instructions provided.  He will bring kardia strips as well.

## 2020-09-03 NOTE — Patient Instructions (Signed)
Start Multaq 400mg twice a day WITH FOOD 

## 2020-09-03 NOTE — Progress Notes (Signed)
Primary Care Physician: Lavone Orn, MD Primary Cardiologist: Dr Gardiner Rhyme Primary Electrophysiologist: none Referring Physician: Dr Thurnell Garbe [name] is a 69 y.o. male with a history of CAD, HTN, HLD, OSA, DVT, atrial flutter, and paroxysmal atrial fibrillation who presents for consultation in the [facility].  The patient was initially diagnosed with atrial fibrillation 09/2016 and was admitted. He was started on flecainide and converted to SR after the first dose. He was maintained on flecainide for years but he had a stress test in [place] which was abnormal and LHC showed 60-70% stenosis of D1. His flecainide was stopped 08/25/20 due to presence of CAD. Patient is on Eliquis for a CHADS2VASC score of 3. Since stopping the flecainide, he has had two episodes of afib with symptoms of heart racing and "feeling off."  There were no specific triggers that he could identify. He is back in SR today.  Today, he denies symptoms of chest pain, shortness of breath, orthopnea, PND, lower extremity edema, dizziness, presyncope, syncope, bleeding, or neurologic sequela. The patient is tolerating medications without difficulties and is otherwise without complaint today.    Atrial Fibrillation Risk Factors:  he does have symptoms or diagnosis of sleep apnea. he is compliant with CPAP therapy. he does not have a history of rheumatic fever.   he has a BMI of Body mass index is 30.62 kg/m.Marland Kitchen Filed Weights   09/03/20 1109  Weight: 102.4 kg    Family History  Problem Relation Age of Onset  . Hypertension Sister   . Prostate cancer Brother   . Hypertension Brother      Atrial Fibrillation Management history:  Previous antiarrhythmic drugs: flecainide Previous cardioversions: none Previous ablations: none CHADS2VASC score: 3 Anticoagulation history: Eliquis   Past Medical History:  Diagnosis Date  . Arthritis    arthritis -back  . Atrial  fibrillation ([place])    a. diagnosed 09/2016 - started on Cardizem CD and Flecainide. Continue Coumadin for anticoagulation.  . Cancer [facility])    cancer Prostate- surgery only  . Chronic anticoagulation 09/10/2016  . History of DVT (deep vein thrombosis)   . History of DVT of lower extremity    left leg has some residual crculation issues  . History of prostate cancer   . Hyperlipidemia 09/09/2018  . Hypertension   . Hypertensive heart disease without CHF 09/29/2016  . Obesity (BMI 30-39.9)   . Paroxysmal atrial fibrillation ([facility])    CHA2DS2VASC score 2  . Sleep apnea    a. uses CPAP  . Sleep apnea in adult    Past Surgical History:  Procedure Laterality Date  . BALLOON DILATION N/A 10/12/2015   Procedure: BALLOON DILATION;  Surgeon: Garlan Fair, MD;  Location: [facility];  Service: Endoscopy;  Laterality: N/A;  . COLONOSCOPY W/ POLYPECTOMY     x2 colon polyps found  . COLONOSCOPY WITH PROPOFOL N/A 10/12/2015   Procedure: COLONOSCOPY WITH PROPOFOL;  Surgeon: Garlan Fair, MD;  Location: [facility];  Service: Endoscopy;  Laterality: N/A;  . ESOPHAGOGASTRODUODENOSCOPY (EGD) WITH PROPOFOL N/A 10/12/2015   Procedure: ESOPHAGOGASTRODUODENOSCOPY (EGD) WITH PROPOFOL;  Surgeon: Garlan Fair, MD;  Location: [facility];  Service: Endoscopy;  Laterality: N/A;  . SHOULDER ARTHROTOMY     x2 procedures- 1 open, 1 scope.  . TONSILLECTOMY    . TRANSURETHRAL RESECTION OF PROSTATE      Current Outpatient Medications  Medication Sig Dispense Refill  . apixaban (ELIQUIS) 5 MG TABS tablet Take 5  mg by mouth 2 (two) times daily.    Marland Kitchen atorvastatin (LIPITOR) 10 MG tablet Take 10 mg by mouth daily.    . calcium-vitamin D (OSCAL WITH D) 500-200 MG-UNIT per tablet Take 1 tablet by mouth 2 (two) times daily with a meal.     . cholecalciferol (VITAMIN D) 1000 UNITS tablet Take 1,000 Units by mouth 2 (two) times daily with a meal.    . Coenzyme Q10 (COQ-10) 200 MG CAPS Take 1 capsule by mouth  daily.    Marland Kitchen diltiazem (DILT-XR) 180 MG 24 hr capsule Take 1 capsule (180 mg total) by mouth daily. 90 capsule 3  . FOLIC ACID PO Take 1,017 mcg by mouth daily.    Marland Kitchen lisinopril-hydrochlorothiazide (ZESTORETIC) 20-25 MG tablet Take 1 tablet by mouth daily.    . polyethylene glycol (MIRALAX) 17 g packet Take 17 g by mouth 2 (two) times daily.    . potassium chloride SA (K-DUR,KLOR-CON) 20 MEQ tablet Take 20 mEq by mouth daily with lunch.     . Testosterone 20.25 MG/ACT (1.62%) GEL 3 Pump daily.  1  . vitamin B-12 (CYANOCOBALAMIN) 1000 MCG tablet Take 1,000 mcg by mouth daily.    Marland Kitchen dronedarone (MULTAQ) 400 MG tablet Take 1 tablet (400 mg total) by mouth 2 (two) times daily with a meal. 60 tablet 3   No current facility-administered medications for this encounter.    Allergies  Allergen Reactions  . Scallops [Shellfish Allergy] Diarrhea and Nausea And Vomiting    Social History   Socioeconomic History  . Marital status: Married    Spouse name: Not on file  . Number of children: Not on file  . Years of education: Not on file  . Highest education level: Not on file  Occupational History  . Not on file  Tobacco Use  . Smoking status: Former Smoker    Years: 15.00    Types: Cigarettes    Quit date: 10/08/2011    Years since quitting: 8.9  . Smokeless tobacco: Never Used  Substance and Sexual Activity  . Alcohol use: No  . Drug use: No  . Sexual activity: Not on file  Other Topics Concern  . Not on file  Social History Narrative  . Not on file   Social Determinants of Health   Financial Resource Strain:   . Difficulty of Paying Living Expenses: Not on file  Food Insecurity:   . Worried About Charity fundraiser in the Last Year: Not on file  . Ran Out of Food in the Last Year: Not on file  Transportation Needs:   . Lack of Transportation (Medical): Not on file  . Lack of Transportation (Non-Medical): Not on file  Physical Activity:   . Days of Exercise per Week: Not on  file  . Minutes of Exercise per Session: Not on file  Stress:   . Feeling of Stress : Not on file  Social Connections:   . Frequency of Communication with Friends and Family: Not on file  . Frequency of Social Gatherings with Friends and Family: Not on file  . Attends Religious Services: Not on file  . Active Member of Clubs or Organizations: Not on file  . Attends Archivist Meetings: Not on file  . Marital Status: Not on file  Intimate Partner Violence:   . Fear of Current or Ex-Partner: Not on file  . Emotionally Abused: Not on file  . Physically Abused: Not on file  . Sexually Abused: Not  on file     ROS- All systems are reviewed and negative except as per the HPI above.  Physical Exam: Vitals:   09/03/20 1109  BP: 116/60  Pulse: 68  Weight: 102.4 kg  Height: 6' (1.829 m)    GEN- The patient is well appearing obese male, alert and oriented x 3 today.   Head- normocephalic, atraumatic Eyes-  Sclera clear, conjunctiva pink Ears- hearing intact Oropharynx- clear Neck- supple  Lungs- Clear to ausculation bilaterally, normal work of breathing Heart- Regular rate and rhythm, no murmurs, rubs or gallops  GI- soft, NT, ND, + BS Extremities- no clubbing, cyanosis, or edema MS- no significant deformity or atrophy Skin- no rash or lesion Psych- euthymic mood, full affect Neuro- strength and sensation are intact  Wt Readings from Last 3 Encounters:  09/03/20 102.4 kg  08/25/20 102.7 kg  09/18/18 99.5 kg    EKG today demonstrates SR HR 68, PACs, PR 166, QRS 90, QTc 391  Echo 09/10/16 demonstrated  - Left ventricle: The cavity size was normal. Systolic function was  normal. The estimated ejection fraction was in the range of 55%  to 60%. Wall motion was normal; there were no regional wall  motion abnormalities.  - Left atrium: The atrium was moderately to severely dilated.   Epic records are reviewed at length today  CHA2DS2-VASc Score = 3  The  patient's score is based upon: CHF History: 0 HTN History: 1 Age : 1 Diabetes History: 0 Stroke History: 0 Vascular Disease History: 1 Gender: 0      ASSESSMENT AND PLAN: 1. Paroxysmal Atrial Fibrillation/atrial flutter The patient's CHA2DS2-VASc score is 3, indicating a 3.2% annual risk of stroke.   We discussed therapeutic options today. Will avoid class 1C with h/o CAD. Could consider Multaq, dofetilide, or ablation. His last echo in 2017 showed mod-severly dilated LA. Will wait for echo to see if he would be candidate for ablation. Will try to avoid a hospitalization for dofetilide loading given the current COVID hospitalizations.  Will plan to start Multaq 400 mg BID. Check cmet/CBC today. Repeat echo pending Continue diltiazem 180 mg daily Continue Eliquis 5 mg BID  2. Secondary Hypercoagulable State (ICD10:  D68.69) The patient is at significant risk for stroke/thromboembolism based upon his CHA2DS2-VASc Score of 3.  Continue Apixaban (Eliquis).   3. Obesity Body mass index is 30.62 kg/m. Lifestyle modification was discussed at length including regular exercise and weight reduction.  4. Obstructive sleep apnea The importance of adequate treatment of sleep apnea was discussed today in order to improve our ability to maintain sinus rhythm long term. Encouraged compliance with CPAP therapy.  5. CAD Non obstructive, no anginal symptoms.  6. HTN Stable, no changes today.   Follow up in the AF clinic next week for ECG and to discuss echo results.   [facility] [address] [place], [place] [address] [phone] 09/03/2020 2:06 PM

## 2020-09-08 ENCOUNTER — Ambulatory Visit: Payer: Medicare Other | Admitting: Cardiology

## 2020-09-08 ENCOUNTER — Other Ambulatory Visit: Payer: Self-pay

## 2020-09-08 ENCOUNTER — Ambulatory Visit (HOSPITAL_COMMUNITY): Payer: Medicare Other | Attending: Cardiovascular Disease

## 2020-09-08 ENCOUNTER — Telehealth (HOSPITAL_COMMUNITY): Payer: Self-pay | Admitting: *Deleted

## 2020-09-08 DIAGNOSIS — I480 Paroxysmal atrial fibrillation: Secondary | ICD-10-CM | POA: Insufficient documentation

## 2020-09-08 LAB — ECHOCARDIOGRAM COMPLETE
Area-P 1/2: 2.95 cm2
P 1/2 time: 746 msec
S' Lateral: 3.4 cm

## 2020-09-08 NOTE — Telephone Encounter (Signed)
Patient approved for coverage of multaq through 09/07/21 by [employer].

## 2020-09-09 ENCOUNTER — Encounter (HOSPITAL_COMMUNITY): Payer: Self-pay | Admitting: Physician Assistant

## 2020-09-09 ENCOUNTER — Ambulatory Visit (HOSPITAL_COMMUNITY)
Admission: RE | Admit: 2020-09-09 | Discharge: 2020-09-09 | Disposition: A | Discharge status: Home / Self Care | Payer: Medicare Other | Source: Ambulatory Visit | Attending: Physician Assistant | Admitting: Physician Assistant

## 2020-09-09 VITALS — BP 116/60 | HR 56 | Ht 72.0 in | Wt 223.2 lb

## 2020-09-09 DIAGNOSIS — I2510 Atherosclerotic heart disease of native coronary artery without angina pectoris: Secondary | ICD-10-CM | POA: Insufficient documentation

## 2020-09-09 DIAGNOSIS — Z7901 Long term (current) use of anticoagulants: Secondary | ICD-10-CM | POA: Diagnosis not present

## 2020-09-09 DIAGNOSIS — D6869 Other thrombophilia: Secondary | ICD-10-CM | POA: Diagnosis not present

## 2020-09-09 DIAGNOSIS — Z79899 Other long term (current) drug therapy: Secondary | ICD-10-CM | POA: Insufficient documentation

## 2020-09-09 DIAGNOSIS — G4733 Obstructive sleep apnea (adult) (pediatric): Secondary | ICD-10-CM | POA: Insufficient documentation

## 2020-09-09 DIAGNOSIS — I480 Paroxysmal atrial fibrillation: Secondary | ICD-10-CM | POA: Diagnosis not present

## 2020-09-09 DIAGNOSIS — E785 Hyperlipidemia, unspecified: Secondary | ICD-10-CM | POA: Diagnosis not present

## 2020-09-09 DIAGNOSIS — Z6830 Body mass index (BMI) 30.0-30.9, adult: Secondary | ICD-10-CM | POA: Insufficient documentation

## 2020-09-09 DIAGNOSIS — Z86718 Personal history of other venous thrombosis and embolism: Secondary | ICD-10-CM | POA: Diagnosis not present

## 2020-09-09 DIAGNOSIS — Z8546 Personal history of malignant neoplasm of prostate: Secondary | ICD-10-CM | POA: Insufficient documentation

## 2020-09-09 DIAGNOSIS — M1990 Unspecified osteoarthritis, unspecified site: Secondary | ICD-10-CM | POA: Insufficient documentation

## 2020-09-09 DIAGNOSIS — E669 Obesity, unspecified: Secondary | ICD-10-CM | POA: Insufficient documentation

## 2020-09-09 DIAGNOSIS — Z87891 Personal history of nicotine dependence: Secondary | ICD-10-CM | POA: Insufficient documentation

## 2020-09-09 DIAGNOSIS — I77819 Aortic ectasia, unspecified site: Secondary | ICD-10-CM

## 2020-09-09 DIAGNOSIS — I4892 Unspecified atrial flutter: Secondary | ICD-10-CM | POA: Insufficient documentation

## 2020-09-09 DIAGNOSIS — Z8249 Family history of ischemic heart disease and other diseases of the circulatory system: Secondary | ICD-10-CM | POA: Diagnosis not present

## 2020-09-09 NOTE — Progress Notes (Signed)
Primary Care Physician: Lavone Orn, MD Primary Cardiologist: Dr Gardiner Rhyme Primary Electrophysiologist: none Referring Physician: Dr Thurnell Garbe Magley is a 69 y.o. male with a history of CAD, HTN, HLD, OSA, DVT, atrial flutter, and paroxysmal atrial fibrillation who presents for follow up in the [facility].  The patient was initially diagnosed with atrial fibrillation 09/2016 and was admitted. He was started on flecainide and converted to SR after the first dose. He was maintained on flecainide for years but he had a stress test in [place] which was abnormal and LHC showed 60-70% stenosis of D1. His flecainide was stopped 08/25/20 due to presence of CAD. Patient is on Eliquis for a CHADS2VASC score of 3. Since stopping the flecainide, he has had two episodes of afib with symptoms of heart racing and "feeling off."  There were no specific triggers that he could identify.   On follow up today, patient reports he has done well on Multaq with no further episodes of heart racing. He did have an echo 09/08/20 which showed EF 60-65% and a normal LA. He denies any bleeding issues on anticoagulation.   Today, he denies symptoms of palpitations, chest pain, shortness of breath, orthopnea, PND, lower extremity edema, dizziness, presyncope, syncope, bleeding, or neurologic sequela. The patient is tolerating medications without difficulties and is otherwise without complaint today.    Atrial Fibrillation Risk Factors:  he does have symptoms or diagnosis of sleep apnea. he is compliant with CPAP therapy. he does not have a history of rheumatic fever.   he has a BMI of Body mass index is 30.27 kg/m.Marland Kitchen Filed Weights   09/09/20 1025  Weight: 101.2 kg    Family History  Problem Relation Age of Onset   Hypertension Sister    Prostate cancer Brother    Hypertension Brother      Atrial Fibrillation Management history:  Previous antiarrhythmic drugs:  flecainide, Multaq Previous cardioversions: none Previous ablations: none CHADS2VASC score: 3 Anticoagulation history: Eliquis   Past Medical History:  Diagnosis Date   Arthritis    arthritis -back   Atrial fibrillation ([place])    a. diagnosed 09/2016 - started on Cardizem CD and Flecainide. Continue Coumadin for anticoagulation.   Cancer [facility])    cancer Prostate- surgery only   Chronic anticoagulation 09/10/2016   History of DVT (deep vein thrombosis)    History of DVT of lower extremity    left leg has some residual crculation issues   History of prostate cancer    Hyperlipidemia 09/09/2018   Hypertension    Hypertensive heart disease without CHF 09/29/2016   Obesity (BMI 30-39.9)    Paroxysmal atrial fibrillation ([facility])    CHA2DS2VASC score 2   Sleep apnea    a. uses CPAP   Sleep apnea in adult    Past Surgical History:  Procedure Laterality Date   BALLOON DILATION N/A 10/12/2015   Procedure: BALLOON DILATION;  Surgeon: Garlan Fair, MD;  Location: [facility];  Service: Endoscopy;  Laterality: N/A;   COLONOSCOPY W/ POLYPECTOMY     x2 colon polyps found   COLONOSCOPY WITH PROPOFOL N/A 10/12/2015   Procedure: COLONOSCOPY WITH PROPOFOL;  Surgeon: Garlan Fair, MD;  Location: [facility];  Service: Endoscopy;  Laterality: N/A;   ESOPHAGOGASTRODUODENOSCOPY (EGD) WITH PROPOFOL N/A 10/12/2015   Procedure: ESOPHAGOGASTRODUODENOSCOPY (EGD) WITH PROPOFOL;  Surgeon: Garlan Fair, MD;  Location: [facility];  Service: Endoscopy;  Laterality: N/A;   SHOULDER ARTHROTOMY  x2 procedures- 1 open, 1 scope.   TONSILLECTOMY     TRANSURETHRAL RESECTION OF PROSTATE      Current Outpatient Medications  Medication Sig Dispense Refill   apixaban (ELIQUIS) 5 MG TABS tablet Take 5 mg by mouth 2 (two) times daily.     atorvastatin (LIPITOR) 10 MG tablet Take 10 mg by mouth daily.     calcium-vitamin D (OSCAL WITH D) 500-200 MG-UNIT per tablet Take 1 tablet  by mouth 2 (two) times daily with a meal.      cholecalciferol (VITAMIN D) 1000 UNITS tablet Take 1,000 Units by mouth 2 (two) times daily with a meal.     Coenzyme Q10 (COQ-10) 200 MG CAPS Take 1 capsule by mouth daily.     diltiazem (DILT-XR) 180 MG 24 hr capsule Take 1 capsule (180 mg total) by mouth daily. 90 capsule 3   dronedarone (MULTAQ) 400 MG tablet Take 1 tablet (400 mg total) by mouth 2 (two) times daily with a meal. 60 tablet 3   FOLIC ACID PO Take 4,098 mcg by mouth daily.     lisinopril-hydrochlorothiazide (ZESTORETIC) 20-25 MG tablet Take 1 tablet by mouth daily.     polyethylene glycol (MIRALAX) 17 g packet Take 17 g by mouth 2 (two) times daily.     potassium chloride SA (K-DUR,KLOR-CON) 20 MEQ tablet Take 20 mEq by mouth daily with lunch.      Testosterone 20.25 MG/ACT (1.62%) GEL 3 Pump daily.  1   vitamin B-12 (CYANOCOBALAMIN) 1000 MCG tablet Take 1,000 mcg by mouth daily.     No current facility-administered medications for this encounter.    Allergies  Allergen Reactions   Scallops [Shellfish Allergy] Diarrhea and Nausea And Vomiting    Social History   Socioeconomic History   Marital status: Married    Spouse name: Not on file   Number of children: Not on file   Years of education: Not on file   Highest education level: Not on file  Occupational History   Not on file  Tobacco Use   Smoking status: Former Smoker    Years: 15.00    Types: Cigarettes    Quit date: 10/08/2011    Years since quitting: 8.9   Smokeless tobacco: Never Used  Substance and Sexual Activity   Alcohol use: No   Drug use: No   Sexual activity: Not on file  Other Topics Concern   Not on file  Social History Narrative   Not on file   Social Determinants of Health   Financial Resource Strain:    Difficulty of Paying Living Expenses: Not on file  Food Insecurity:    Worried About Charity fundraiser in the Last Year: Not on file   [employer] of Food in  the Last Year: Not on file  Transportation Needs:    Lack of Transportation (Medical): Not on file   Lack of Transportation (Non-Medical): Not on file  Physical Activity:    Days of Exercise per Week: Not on file   Minutes of Exercise per Session: Not on file  Stress:    Feeling of Stress : Not on file  Social Connections:    Frequency of Communication with Friends and Family: Not on file   Frequency of Social Gatherings with Friends and Family: Not on file   Attends Religious Services: Not on file   Active Member of Clubs or Organizations: Not on file   Attends Archivist Meetings: Not on file  Marital Status: Not on file  Intimate Partner Violence:    Fear of Current or Ex-Partner: Not on file   Emotionally Abused: Not on file   Physically Abused: Not on file   Sexually Abused: Not on file     ROS- All systems are reviewed and negative except as per the HPI above.  Physical Exam: Vitals:   09/09/20 1025  BP: 116/60  Pulse: (!) 56  Weight: 101.2 kg  Height: 6' (1.829 m)    GEN- The patient is well appearing obese male, alert and oriented x 3 today.   HEENT-head normocephalic, atraumatic, sclera clear, conjunctiva pink, hearing intact, trachea midline. Lungs- Clear to ausculation bilaterally, normal work of breathing Heart- Regular rate and rhythm, bradycardia, no murmurs, rubs or gallops  GI- soft, NT, ND, + BS Extremities- no clubbing, cyanosis, or edema MS- no significant deformity or atrophy Skin- no rash or lesion Psych- euthymic mood, full affect Neuro- strength and sensation are intact   Wt Readings from Last 3 Encounters:  09/09/20 101.2 kg  09/03/20 102.4 kg  08/25/20 102.7 kg    EKG today demonstrates SB HR 56, PR 158, QRS 90, QTc 386  Echo 09/08/20 demonstrated  1. Left ventricular ejection fraction, by estimation, is 60 to 65%. The  left ventricle has normal function. The left ventricle has no regional  wall motion  abnormalities. There is mild concentric left ventricular  hypertrophy. Left ventricular diastolic  parameters were normal.  2. Right ventricular systolic function is normal. The right ventricular  size is normal. There is normal pulmonary artery systolic pressure. The  estimated right ventricular systolic pressure is 48.5 mmHg.  3. The mitral valve is grossly normal. Trivial mitral valve  regurgitation. No evidence of mitral stenosis.  4. The aortic valve is tricuspid. There is moderate calcification of the  aortic valve. There is moderate thickening of the aortic valve. Aortic  valve regurgitation is mild. Mild to moderate aortic valve  sclerosis/calcification is present, without any  evidence of aortic stenosis.  5. There is mild dilatation of the ascending aorta, measuring 42 mm.  6. The inferior vena cava is normal in size with greater than 50%  respiratory variability, suggesting right atrial pressure of 3 mmHg.   Comparison(s): Changes from prior study are noted. EF normal 60-65%.  Normal LA. Mild AI.   Epic records are reviewed at length today  CHA2DS2-VASc Score = 3  The patient's score is based upon: CHF History: 0 HTN History: 1 Age : 1 Diabetes History: 0 Stroke History: 0 Vascular Disease History: 1 Gender: 0      ASSESSMENT AND PLAN: 1. Paroxysmal Atrial Fibrillation/atrial flutter The patient's CHA2DS2-VASc score is 3, indicating a 3.2% annual risk of stroke.   We discussed therapeutic options today. With positive remodeling of LA on recent echo, he would like to be considered for ablation. Will refer to EP for evaluation.  Continue Multaq 400 mg BID for now. Patient would prefer ablation to AAD for long term management.  Continue diltiazem 180 mg daily Continue Eliquis 5 mg BID  2. Secondary Hypercoagulable State (ICD10:  D68.69) The patient is at significant risk for stroke/thromboembolism based upon his CHA2DS2-VASc Score of 3.  Continue Apixaban  (Eliquis).   3. Obesity Body mass index is 30.27 kg/m. Lifestyle modification was discussed and encouraged including regular physical activity and weight reduction.  4. Obstructive sleep apnea Patient reports compliance with CPAP therapy.  5. CAD No anginal symptoms. Followed by Dr Gardiner Rhyme.  6. HTN Stable, no changes today.   Follow up with EP to consider ablation.    [facility] [address] [place], [place] [address] [phone] 09/09/2020 10:58 AM

## 2020-09-10 ENCOUNTER — Telehealth (HOSPITAL_COMMUNITY): Payer: Self-pay | Admitting: *Deleted

## 2020-09-10 ENCOUNTER — Encounter (HOSPITAL_COMMUNITY): Payer: Self-pay

## 2020-09-10 NOTE — Telephone Encounter (Signed)
Patient approved for eliquis patient assistance through 12/03/20. Application # [id number]

## 2020-09-13 ENCOUNTER — Encounter (HOSPITAL_COMMUNITY): Payer: Self-pay

## 2020-09-13 ENCOUNTER — Telehealth (HOSPITAL_COMMUNITY): Payer: Self-pay | Admitting: *Deleted

## 2020-09-13 DIAGNOSIS — M1811 Unilateral primary osteoarthritis of first carpometacarpal joint, right hand: Secondary | ICD-10-CM | POA: Diagnosis not present

## 2020-09-13 DIAGNOSIS — I480 Paroxysmal atrial fibrillation: Secondary | ICD-10-CM | POA: Diagnosis not present

## 2020-09-13 DIAGNOSIS — Q613 Polycystic kidney, unspecified: Secondary | ICD-10-CM | POA: Diagnosis not present

## 2020-09-13 DIAGNOSIS — I2510 Atherosclerotic heart disease of native coronary artery without angina pectoris: Secondary | ICD-10-CM | POA: Diagnosis not present

## 2020-09-13 DIAGNOSIS — I10 Essential (primary) hypertension: Secondary | ICD-10-CM | POA: Diagnosis not present

## 2020-09-13 DIAGNOSIS — M810 Age-related osteoporosis without current pathological fracture: Secondary | ICD-10-CM | POA: Diagnosis not present

## 2020-09-13 DIAGNOSIS — Z8546 Personal history of malignant neoplasm of prostate: Secondary | ICD-10-CM | POA: Diagnosis not present

## 2020-09-13 DIAGNOSIS — I4891 Unspecified atrial fibrillation: Secondary | ICD-10-CM | POA: Diagnosis not present

## 2020-09-13 NOTE — Telephone Encounter (Signed)
Patient approved for patient assistance for Multaq through 12/03/20.

## 2020-09-14 DIAGNOSIS — Z23 Encounter for immunization: Secondary | ICD-10-CM | POA: Diagnosis not present

## 2020-09-16 ENCOUNTER — Encounter (HOSPITAL_COMMUNITY): Payer: Self-pay

## 2020-09-20 ENCOUNTER — Encounter: Payer: Self-pay | Admitting: Cardiology

## 2020-09-20 ENCOUNTER — Ambulatory Visit (INDEPENDENT_AMBULATORY_CARE_PROVIDER_SITE_OTHER): Payer: Medicare Other | Admitting: Cardiology

## 2020-09-20 ENCOUNTER — Ambulatory Visit (HOSPITAL_COMMUNITY)
Admission: RE | Admit: 2020-09-20 | Discharge: 2020-09-20 | Disposition: A | Discharge status: Home / Self Care | Payer: Medicare Other | Source: Ambulatory Visit | Attending: Cardiology | Admitting: Cardiology

## 2020-09-20 ENCOUNTER — Other Ambulatory Visit: Payer: Self-pay

## 2020-09-20 VITALS — BP 106/64 | HR 63 | Ht 72.0 in | Wt 225.4 lb

## 2020-09-20 DIAGNOSIS — I700 Atherosclerosis of aorta: Secondary | ICD-10-CM | POA: Diagnosis not present

## 2020-09-20 DIAGNOSIS — I480 Paroxysmal atrial fibrillation: Secondary | ICD-10-CM | POA: Diagnosis not present

## 2020-09-20 DIAGNOSIS — Z79899 Other long term (current) drug therapy: Secondary | ICD-10-CM

## 2020-09-20 DIAGNOSIS — I712 Thoracic aortic aneurysm, without rupture: Secondary | ICD-10-CM | POA: Diagnosis not present

## 2020-09-20 DIAGNOSIS — E669 Obesity, unspecified: Secondary | ICD-10-CM | POA: Diagnosis not present

## 2020-09-20 DIAGNOSIS — I77819 Aortic ectasia, unspecified site: Secondary | ICD-10-CM | POA: Insufficient documentation

## 2020-09-20 DIAGNOSIS — I4891 Unspecified atrial fibrillation: Secondary | ICD-10-CM | POA: Diagnosis not present

## 2020-09-20 DIAGNOSIS — I2510 Atherosclerotic heart disease of native coronary artery without angina pectoris: Secondary | ICD-10-CM | POA: Diagnosis not present

## 2020-09-20 MED ORDER — IOHEXOL 350 MG/ML SOLN
100.0000 mL | Freq: Once | INTRAVENOUS | Status: AC | PRN
  Administered 2020-09-20: 100 mL via INTRAVENOUS

## 2020-09-20 NOTE — Progress Notes (Signed)
Electrophysiology Office Note:    Date:  09/20/2020   ID:  [name], DOB [birth year], MRN [id number]  PCP:  Lavone Orn, MD  [facility] Cardiologist:  No primary care provider on file.  [facility] Electrophysiologist:  None   Referring MD: Oliver Barre, PA   Chief Complaint: Atrial fibrillation  History of Present Illness:    [name] is a 69 y.o. male who presents for an evaluation of paroxysmal atrial fibrillation at the request of Adline Peals, PA-C. Their medical history includes history of DVT on chronic anticoagulation, hypertension, hyperlipidemia, obesity, sleep apnea on CPAP.  He was originally diagnosed with atrial fibrillation in 2017 during a 3 night stay for A. fib with RVR.  He was started on flecainide which help maintain normal rhythm for several years.  He did have a stress test in [place] which was abnormal and a left heart catheterization which ultimately showed a 60 to 70% stenosis of the first diagonal.  His flecainide was then stopped.  He subsequently developed episodes of symptomatic paroxysmal atrial fibrillation and he as started on Multaq around.  This is helped maintain normal rhythm.  He has a history of unprovoked DVTs on anticoagulation.  He tells me that he stopped his anticoagulation he will develop another DVT.  Past Medical History:  Diagnosis Date  . Arthritis    arthritis -back  . Atrial fibrillation ([place])    a. diagnosed 09/2016 - started on Cardizem CD and Flecainide. Continue Coumadin for anticoagulation.  . Cancer [facility])    cancer Prostate- surgery only  . Chronic anticoagulation 09/10/2016  . History of DVT (deep vein thrombosis)   . History of DVT of lower extremity    left leg has some residual crculation issues  . History of prostate cancer   . Hyperlipidemia 09/09/2018  . Hypertension   . Hypertensive heart disease without CHF 09/29/2016  . Obesity (BMI 30-39.9)   . Paroxysmal atrial fibrillation (HCC)     CHA2DS2VASC score 2  . Sleep apnea    a. uses CPAP  . Sleep apnea in adult     Past Surgical History:  Procedure Laterality Date  . BALLOON DILATION N/A 10/12/2015   Procedure: BALLOON DILATION;  Surgeon: Garlan Fair, MD;  Location: [facility];  Service: Endoscopy;  Laterality: N/A;  . COLONOSCOPY W/ POLYPECTOMY     x2 colon polyps found  . COLONOSCOPY WITH PROPOFOL N/A 10/12/2015   Procedure: COLONOSCOPY WITH PROPOFOL;  Surgeon: Garlan Fair, MD;  Location: [facility];  Service: Endoscopy;  Laterality: N/A;  . ESOPHAGOGASTRODUODENOSCOPY (EGD) WITH PROPOFOL N/A 10/12/2015   Procedure: ESOPHAGOGASTRODUODENOSCOPY (EGD) WITH PROPOFOL;  Surgeon: Garlan Fair, MD;  Location: [facility];  Service: Endoscopy;  Laterality: N/A;  . SHOULDER ARTHROTOMY     x2 procedures- 1 open, 1 scope.  . TONSILLECTOMY    . TRANSURETHRAL RESECTION OF PROSTATE      Current Medications: Current Meds  Medication Sig  . apixaban (ELIQUIS) 5 MG TABS tablet Take 5 mg by mouth 2 (two) times daily.  Marland Kitchen atorvastatin (LIPITOR) 10 MG tablet Take 10 mg by mouth daily.  . calcium-vitamin D (OSCAL WITH D) 500-200 MG-UNIT per tablet Take 1 tablet by mouth 2 (two) times daily with a meal.   . cholecalciferol (VITAMIN D) 1000 UNITS tablet Take 1,000 Units by mouth 2 (two) times daily with a meal.  . Coenzyme Q10 (COQ-10) 200 MG CAPS Take 1 capsule by mouth daily.  Marland Kitchen diltiazem (DILT-XR) 180  MG 24 hr capsule Take 1 capsule (180 mg total) by mouth daily.  Marland Kitchen dronedarone (MULTAQ) 400 MG tablet Take 1 tablet (400 mg total) by mouth 2 (two) times daily with a meal.  . FOLIC ACID PO Take 2,979 mcg by mouth daily.  Marland Kitchen lisinopril-hydrochlorothiazide (ZESTORETIC) 20-25 MG tablet Take 1 tablet by mouth daily.  . polyethylene glycol (MIRALAX) 17 g packet Take 17 g by mouth 2 (two) times daily.  . potassium chloride SA (K-DUR,KLOR-CON) 20 MEQ tablet Take 20 mEq by mouth daily with lunch.   . Testosterone 20.25 MG/ACT  (1.62%) GEL 3 Pump daily.  . vitamin B-12 (CYANOCOBALAMIN) 1000 MCG tablet Take 1,000 mcg by mouth daily.     Allergies:   Scallops [shellfish allergy]   Social History   Socioeconomic History  . Marital status: Married    Spouse name: Not on file  . Number of children: Not on file  . Years of education: Not on file  . Highest education level: Not on file  Occupational History  . Not on file  Tobacco Use  . Smoking status: Former Smoker    Years: 15.00    Types: Cigarettes    Quit date: 10/08/2011    Years since quitting: 8.9  . Smokeless tobacco: Never Used  Substance and Sexual Activity  . Alcohol use: No  . Drug use: No  . Sexual activity: Not on file  Other Topics Concern  . Not on file  Social History Narrative  . Not on file   Social Determinants of Health   Financial Resource Strain:   . Difficulty of Paying Living Expenses: Not on file  Food Insecurity:   . Worried About Charity fundraiser in the Last Year: Not on file  . Ran Out of Food in the Last Year: Not on file  Transportation Needs:   . Lack of Transportation (Medical): Not on file  . Lack of Transportation (Non-Medical): Not on file  Physical Activity:   . Days of Exercise per Week: Not on file  . Minutes of Exercise per Session: Not on file  Stress:   . Feeling of Stress : Not on file  Social Connections:   . Frequency of Communication with Friends and Family: Not on file  . Frequency of Social Gatherings with Friends and Family: Not on file  . Attends Religious Services: Not on file  . Active Member of Clubs or Organizations: Not on file  . Attends Archivist Meetings: Not on file  . Marital Status: Not on file     Family History: The patient's family history includes Hypertension in his brother and sister; Prostate cancer in his brother.  ROS:   Please see the history of present illness.    All other systems reviewed and are negative.  EKGs/Labs/Other Studies Reviewed:     The following studies were reviewed today: Prior records, echo  September 08, 2020 echo personally reviewed Normal left ventricular function Prominent aortic root No significant valvular abnormalities   EKG:  The ekg ordered today demonstrates normal sinus rhythm.  Absence of septal Q wave in V6.  QRS duration 86 ms.  QTc 417 ms.   Recent Labs: 09/03/2020: ALT 18; BUN 13; Creatinine, Ser 0.88; Hemoglobin 16.8; Platelets 188; Potassium 4.2; Sodium 132  Recent Lipid Panel    Component Value Date/Time   CHOL 169 09/09/2016 0421   TRIG 77 09/09/2016 0421   HDL 49 09/09/2016 0421   CHOLHDL 3.4 09/09/2016 0421  VLDL 15 09/09/2016 0421   LDLCALC 105 (H) 09/09/2016 0421    Physical Exam:    VS:  BP 106/64   Pulse 63   Ht 6' (1.829 m)   Wt 225 lb 6.4 oz (102.2 kg)   SpO2 97%   BMI 30.57 kg/m     Wt Readings from Last 3 Encounters:  09/20/20 225 lb 6.4 oz (102.2 kg)  09/09/20 223 lb 3.2 oz (101.2 kg)  09/03/20 225 lb 12.8 oz (102.4 kg)     GEN:  Well nourished, well developed in no acute distress HEENT: Normal NECK: No JVD; No carotid bruits LYMPHATICS: No lymphadenopathy CARDIAC: RRR, no murmurs, rubs, gallops RESPIRATORY:  Clear to auscultation without rales, wheezing or rhonchi  ABDOMEN: Soft, non-tender, non-distended MUSCULOSKELETAL:  No edema; No deformity  SKIN: Warm and dry NEUROLOGIC:  Alert and oriented x 3 PSYCHIATRIC:  Normal affect   ASSESSMENT:    1. Paroxysmal atrial fibrillation (HCC)   2. Obesity (BMI 30-39.9)   3. High risk medication use    PLAN:    In order of problems listed above:  1. Paroxysmal atrial fibrillation Patient has a CHA2DS2-VASc of 3 on Eliquis (hypertension, coronary disease, age).  Maintaining normal rhythm on dronedarone and diltiazem.  I discussed options for rhythm control with the patient including continued antiarrhythmic use versus ablation.  Given the highly symptomatic nature of his atrial fibrillation episodes and  his desire to avoid associated off target effects from antiarrhythmic therapy, the patient has elected to pursue ablation of his atrial fibrillation.  Risk, benefits, and alternatives to EP study and radiofrequency ablation for afib were also discussed in detail today. These risks include but are not limited to stroke, bleeding, vascular damage, tamponade, perforation, damage to the esophagus, lungs, and other structures, pulmonary vein stenosis, worsening renal function, and death. The patient understands these risk and wishes to proceed.  We will therefore proceed with catheter ablation at the next available time.  Carto, ICE, anesthesia are requested for the procedure.    2.  Obesity Counseled on the link between obesity and atrial fibrillation including ablation success rates.  3.  Sleep apnea on CPAP Continue CPAP  Medication Adjustments/Labs and Tests Ordered: Current medicines are reviewed at length with the patient today.  Concerns regarding medicines are outlined above.  No orders of the defined types were placed in this encounter.  No orders of the defined types were placed in this encounter.    Signed, Lars Mage, MD, [facility]  09/20/2020 11:24 AM    Electrophysiology [facility]     Anticoagulation instructions: Pt to hold Eliquis for 1 dose(s) prior to procedure and we will plan to resume the day of the procedure unless otherwise instructed after surgery.  Medication instructions morning of: The patient should hold ALL medications the morning of the procedure   Discharge: Our plan will be to discharge the patient same day after a period of observation

## 2020-09-20 NOTE — Patient Instructions (Addendum)
Medication Instructions:  Your physician recommends that you continue on your current medications as directed. Please refer to the Current Medication list given to you today. *If you need a refill on your cardiac medications before your next appointment, please call your pharmacy*  Lab Work: None ordered. If you have labs (blood work) drawn today and your tests are completely normal, you will receive your results only by: Marland Kitchen MyChart Message (if you have MyChart) OR . A paper copy in the mail If you have any lab test that is abnormal or we need to change your treatment, we will call you to review the results.  Testing/Procedures: None ordered.  Follow-Up: At [facility], you and your health needs are our priority.  As part of our continuing mission to provide you with exceptional heart care, we have created designated Provider Care Teams.  These Care Teams include your primary Cardiologist (physician) and Advanced Practice Providers (APPs -  Physician Assistants and Nurse Practitioners) who all work together to provide you with the care you need, when you need it.  Your next appointment:    SEE INSTRUCTION LETTER    Cardiac Ablation Cardiac ablation is a procedure to disable (ablate) a small amount of heart tissue in very specific places. The heart has many electrical connections. Sometimes these connections are abnormal and can cause the heart to beat very fast or irregularly. Ablating some of the problem areas can improve the heart rhythm or return it to normal. Ablation may be done for people who:  Have Wolff-Parkinson-White syndrome.  Have fast heart rhythms (tachycardia).  Have taken medicines for an abnormal heart rhythm (arrhythmia) that were not effective or caused side effects.  Have a high-risk heartbeat that may be life-threatening. During the procedure, a small incision is made in the neck or the groin, and a long, thin, flexible tube (catheter) is inserted into the  incision and moved to the heart. Small devices (electrodes) on the tip of the catheter will send out electrical currents. A type of X-ray (fluoroscopy) will be used to help guide the catheter and to provide images of the heart. Tell a health care provider about:  Any allergies you have.  All medicines you are taking, including vitamins, herbs, eye drops, creams, and over-the-counter medicines.  Any problems you or family members have had with anesthetic medicines.  Any blood disorders you have.  Any surgeries you have had.  Any medical conditions you have, such as kidney failure.  Whether you are pregnant or may be pregnant. What are the risks? Generally, this is a safe procedure. However, problems may occur, including:  Infection.  Bruising and bleeding at the catheter insertion site.  Bleeding into the chest, especially into the sac that surrounds the heart. This is a serious complication.  Stroke or blood clots.  Damage to other structures or organs.  Allergic reaction to medicines or dyes.  Need for a permanent pacemaker if the normal electrical system is damaged. A pacemaker is a small computer that sends electrical signals to the heart and helps your heart beat normally.  The procedure not being fully effective. This may not be recognized until months later. Repeat ablation procedures are sometimes required. What happens before the procedure?  Follow instructions from your health care provider about eating or drinking restrictions.  Ask your health care provider about: ? Changing or stopping your regular medicines. This is especially important if you are taking diabetes medicines or blood thinners. ? Taking medicines such as aspirin  and ibuprofen. These medicines can thin your blood. Do not take these medicines before your procedure if your health care provider instructs you not to.  Plan to have someone take you home from the hospital or clinic.  If you will be  going home right after the procedure, plan to have someone with you for 24 hours. What happens during the procedure?  To lower your risk of infection: ? Your health care team will wash or sanitize their hands. ? Your skin will be washed with soap. ? Hair may be removed from the incision area.  An IV tube will be inserted into one of your veins.  You will be given a medicine to help you relax (sedative).  The skin on your neck or groin will be numbed.  An incision will be made in your neck or your groin.  A needle will be inserted through the incision and into a large vein in your neck or groin.  A catheter will be inserted into the needle and moved to your heart.  Dye may be injected through the catheter to help your surgeon see the area of the heart that needs treatment.  Electrical currents will be sent from the catheter to ablate heart tissue in desired areas. There are three types of energy that may be used to ablate heart tissue: ? Heat (radiofrequency energy). ? Laser energy. ? Extreme cold (cryoablation).  When the necessary tissue has been ablated, the catheter will be removed.  Pressure will be held on the catheter insertion area to prevent excessive bleeding.  A bandage (dressing) will be placed over the catheter insertion area. The procedure may vary among health care providers and hospitals. What happens after the procedure?  Your blood pressure, heart rate, breathing rate, and blood oxygen level will be monitored until the medicines you were given have worn off.  Your catheter insertion area will be monitored for bleeding. You will need to lie still for a few hours to ensure that you do not bleed from the catheter insertion area.  Do not drive for 24 hours or as long as directed by your health care provider. Summary  Cardiac ablation is a procedure to disable (ablate) a small amount of heart tissue in very specific places. Ablating some of the problem areas can  improve the heart rhythm or return it to normal.  During the procedure, electrical currents will be sent from the catheter to ablate heart tissue in desired areas. This information is not intended to replace advice given to you by your health care provider. Make sure you discuss any questions you have with your health care provider. Document Revised: 05/13/2018 Document Reviewed: 10/09/2016 Elsevier Patient Education  [place].

## 2020-09-21 ENCOUNTER — Other Ambulatory Visit: Payer: Self-pay | Admitting: *Deleted

## 2020-09-21 DIAGNOSIS — I77819 Aortic ectasia, unspecified site: Secondary | ICD-10-CM

## 2020-09-24 ENCOUNTER — Encounter (HOSPITAL_COMMUNITY): Payer: Self-pay

## 2020-09-27 DIAGNOSIS — D2262 Melanocytic nevi of left upper limb, including shoulder: Secondary | ICD-10-CM | POA: Diagnosis not present

## 2020-09-27 DIAGNOSIS — L814 Other melanin hyperpigmentation: Secondary | ICD-10-CM | POA: Diagnosis not present

## 2020-09-27 DIAGNOSIS — D1801 Hemangioma of skin and subcutaneous tissue: Secondary | ICD-10-CM | POA: Diagnosis not present

## 2020-09-27 DIAGNOSIS — D225 Melanocytic nevi of trunk: Secondary | ICD-10-CM | POA: Diagnosis not present

## 2020-09-27 DIAGNOSIS — C44519 Basal cell carcinoma of skin of other part of trunk: Secondary | ICD-10-CM | POA: Diagnosis not present

## 2020-09-27 DIAGNOSIS — D485 Neoplasm of uncertain behavior of skin: Secondary | ICD-10-CM | POA: Diagnosis not present

## 2020-09-27 DIAGNOSIS — D2261 Melanocytic nevi of right upper limb, including shoulder: Secondary | ICD-10-CM | POA: Diagnosis not present

## 2020-09-27 DIAGNOSIS — L570 Actinic keratosis: Secondary | ICD-10-CM | POA: Diagnosis not present

## 2020-09-27 DIAGNOSIS — Z85828 Personal history of other malignant neoplasm of skin: Secondary | ICD-10-CM | POA: Diagnosis not present

## 2020-09-27 DIAGNOSIS — L821 Other seborrheic keratosis: Secondary | ICD-10-CM | POA: Diagnosis not present

## 2020-10-10 NOTE — Progress Notes (Signed)
Cardiology Office Note:    Date:  10/11/2020   ID:  [name], DOB [birth year], MRN [id number]  PCP:  Manuel Orn, MD  Cardiologist:  No primary care provider on file.  Electrophysiologist:  None   Referring MD: Manuel Orn, MD   Chief Complaint  Patient presents with  . Chest Pain    History of Present Illness:    [name] is a 69 y.o. male with a hx of paroxysmal atrial fibrillation, hypertension, OSA, DVT, prostate cancer, PAD, hyperlipidemia who presents for follow-up.  He has had issues with recurrent venous thromboembolism, currently on Eliquis.  He was admitted to [facility] in October 2017 with atrial fibrillation.  He converted to sinus rhythm during admission and was started on flecainide and diltiazem.  Echocardiogram at that time showed normal LV function, moderate to severe left atrial dilatation, no significant valvular disease.  He spends 6 months/year in [place] and reports that in 2020 established with a cardiologist in [place].  Stress test was done, though patient denied having any symptoms.  Stress test was abnormal and underwent cardiac catheterization which showed 60 to 70% stenosis of D1.  Medical management was recommended.  Reports that in August 2021 he had his first recurrence of atrial fibrillation since 2017.  States that he went into AF for about 1 hour with heart rate in 150s.  Converted spontaneously.  Reports that he is active, walks 2.5 miles per day.  Denies any exertional symptoms.  Does report rare resting chest pain that he describes as left-sided sharp pain, but no exertional chest pain.  Denies any lightheadedness or syncope.  Reports that he smoked for 20 years, up to 2 packs/day but quit in 2000.  No known history of heart disease in his immediate family.  Echocardiogram 09/08/2020 showed normal biventricular function, mild AI, dilatation of the ascending aorta measuring 42 mm.  CTA chest on 09/20/2020 showed mild dilatation of the ascending  aorta measuring up to 40 mm.  Since last clinic visit, he reports that he has been feeling well.  States that his palpitations resolved with starting Multaq.  Planning ablation with Dr. Quentin Ore next month.  Reports occasional chest pain that he describes as dull aching 1 out of 10 left-sided pain that lasts for short time.  No relationship with exertion or stress.  Reports that he walks several miles per day and denies any exertional pain.  Denies any lightheadedness or syncope.  Reports BP has been well controlled.    Past Medical History:  Diagnosis Date  . Arthritis    arthritis -back  . Atrial fibrillation ([place])    a. diagnosed 09/2016 - started on Cardizem CD and Flecainide. Continue Coumadin for anticoagulation.  . Cancer [facility])    cancer Prostate- surgery only  . Chronic anticoagulation 09/10/2016  . History of DVT (deep vein thrombosis)   . History of DVT of lower extremity    left leg has some residual crculation issues  . History of prostate cancer   . Hyperlipidemia 09/09/2018  . Hypertension   . Hypertensive heart disease without CHF 09/29/2016  . Obesity (BMI 30-39.9)   . Paroxysmal atrial fibrillation (HCC)    CHA2DS2VASC score 2  . Sleep apnea    a. uses CPAP  . Sleep apnea in adult     Past Surgical History:  Procedure Laterality Date  . BALLOON DILATION N/A 10/12/2015   Procedure: BALLOON DILATION;  Surgeon: Manuel Fair, MD;  Location: [facility];  Service: Endoscopy;  Laterality: N/A;  . COLONOSCOPY W/ POLYPECTOMY     x2 colon polyps found  . COLONOSCOPY WITH PROPOFOL N/A 10/12/2015   Procedure: COLONOSCOPY WITH PROPOFOL;  Surgeon: Manuel Fair, MD;  Location: [facility];  Service: Endoscopy;  Laterality: N/A;  . ESOPHAGOGASTRODUODENOSCOPY (EGD) WITH PROPOFOL N/A 10/12/2015   Procedure: ESOPHAGOGASTRODUODENOSCOPY (EGD) WITH PROPOFOL;  Surgeon: Manuel Fair, MD;  Location: [facility];  Service: Endoscopy;  Laterality: N/A;  . SHOULDER ARTHROTOMY      x2 procedures- 1 open, 1 scope.  . TONSILLECTOMY    . TRANSURETHRAL RESECTION OF PROSTATE      Current Medications: Current Meds  Medication Sig  . apixaban (ELIQUIS) 5 MG TABS tablet Take 5 mg by mouth 2 (two) times daily.  Marland Kitchen atorvastatin (LIPITOR) 10 MG tablet Take 10 mg by mouth daily.  . calcium-vitamin D (OSCAL WITH D) 500-200 MG-UNIT per tablet Take 1 tablet by mouth 2 (two) times daily with a meal.   . cholecalciferol (VITAMIN D) 1000 UNITS tablet Take 1,000 Units by mouth 2 (two) times daily with a meal.  . Coenzyme Q10 (COQ-10) 200 MG CAPS Take 1 capsule by mouth daily.  Marland Kitchen diltiazem (DILT-XR) 180 MG 24 hr capsule Take 1 capsule (180 mg total) by mouth daily.  Marland Kitchen dronedarone (MULTAQ) 400 MG tablet Take 1 tablet (400 mg total) by mouth 2 (two) times daily with a meal.  . FOLIC ACID PO Take 7,425 mcg by mouth daily.  Marland Kitchen lisinopril-hydrochlorothiazide (ZESTORETIC) 20-25 MG tablet Take 1 tablet by mouth daily.  . polyethylene glycol (MIRALAX) 17 g packet Take 17 g by mouth 2 (two) times daily.  . potassium chloride SA (K-DUR,KLOR-CON) 20 MEQ tablet Take 20 mEq by mouth daily with lunch.   . Testosterone 20.25 MG/ACT (1.62%) GEL 3 Pump daily.  . vitamin B-12 (CYANOCOBALAMIN) 1000 MCG tablet Take 1,000 mcg by mouth daily.     Allergies:   Scallops [shellfish allergy]   Social History   Socioeconomic History  . Marital status: Married    Spouse name: Not on file  . Number of children: Not on file  . Years of education: Not on file  . Highest education level: Not on file  Occupational History  . Not on file  Tobacco Use  . Smoking status: Former Smoker    Years: 15.00    Types: Cigarettes    Quit date: 12/04/1998    Years since quitting: 21.8  . Smokeless tobacco: Never Used  Substance and Sexual Activity  . Alcohol use: No  . Drug use: No  . Sexual activity: Not on file  Other Topics Concern  . Not on file  Social History Narrative  . Not on file   Social  Determinants of Health   Financial Resource Strain:   . Difficulty of Paying Living Expenses: Not on file  Food Insecurity:   . Worried About Charity fundraiser in the Last Year: Not on file  . Ran Out of Food in the Last Year: Not on file  Transportation Needs:   . Lack of Transportation (Medical): Not on file  . Lack of Transportation (Non-Medical): Not on file  Physical Activity:   . Days of Exercise per Week: Not on file  . Minutes of Exercise per Session: Not on file  Stress:   . Feeling of Stress : Not on file  Social Connections:   . Frequency of Communication with Friends and Family: Not on file  . Frequency of Social Gatherings  with Friends and Family: Not on file  . Attends Religious Services: Not on file  . Active Member of Clubs or Organizations: Not on file  . Attends Archivist Meetings: Not on file  . Marital Status: Not on file     Family History: The patient's family history includes Hypertension in his brother and sister; Prostate cancer in his brother.  ROS:   Please see the history of present illness.     All other systems reviewed and are negative.  EKGs/Labs/Other Studies Reviewed:    The following studies were reviewed today:   EKG:  EKG is not ordered today.  The ekg ordered at prior visit demonstrates normal sinus rhythm, rate 60, no ST/T abnormalities.  Her fall was on  Recent Labs: 09/03/2020: ALT 18; BUN 13; Creatinine, Ser 0.88; Hemoglobin 16.8; Platelets 188; Potassium 4.2; Sodium 132  Recent Lipid Panel    Component Value Date/Time   CHOL 169 09/09/2016 0421   TRIG 77 09/09/2016 0421   HDL 49 09/09/2016 0421   CHOLHDL 3.4 09/09/2016 0421   VLDL 15 09/09/2016 0421   LDLCALC 105 (H) 09/09/2016 0421    Physical Exam:    VS:  BP 128/70   Pulse 60   Ht 6' (1.829 m)   Wt 225 lb 12.8 oz (102.4 kg)   SpO2 97%   BMI 30.62 kg/m     Wt Readings from Last 3 Encounters:  10/11/20 225 lb 12.8 oz (102.4 kg)  09/20/20 225 lb 6.4  oz (102.2 kg)  09/09/20 223 lb 3.2 oz (101.2 kg)     GEN:  Well nourished, well developed in no acute distress HEENT: Normal NECK: No JVD; No carotid bruits CARDIAC: RRR, no murmurs, rubs, gallops RESPIRATORY:  Clear to auscultation without rales, wheezing or rhonchi  ABDOMEN: Soft, non-tender, non-distended MUSCULOSKELETAL:  No edema; No deformity  SKIN: Warm and dry NEUROLOGIC:  Alert and oriented x 3 PSYCHIATRIC:  Normal affect   ASSESSMENT:    1. Paroxysmal atrial fibrillation (HCC)   2. Aortic dilatation (HCC)   3. Coronary artery disease involving native coronary artery of native heart without angina pectoris   4. Essential hypertension   5. PAD (peripheral artery disease) ([place])   6. Hyperlipidemia, unspecified hyperlipidemia type    PLAN:    Paroxysmal atrial fibrillation/flutter: CHA2DS2-VASc score 3 (hypertension, age, PAD).  Echocardiogram 09/08/2020 showed normal biventricular function, no significant valvular disease.   -Continue Eliquis 5 mg twice daily -Continue diltiazem 180 mg daily -Previously had been on flecainide, but given his CAD, this was discontinued.  He was started on Multaq 400 mg twice daily.  Seen by Dr. Quentin Ore in EP, planning ablation next month  CAD: Catheterization in [place] last year showed 60 to 70% D1 stenosis.  Managing medically.  Currently reports intermittent chest pain that sounds noncardiac, no relationship with exertion or stress. -Continue Eliquis and statin.  LDL at goal less than 70.  Aortic dilatation: Ascending aorta measures 40 mm on CTA 09/20/2020.  Will follow up with [facility] in 1 year.  Hypertension: On lisinopril-hydrochlorothiazide 20-12.5 mg daily and diltiazem 180 mg daily.  Appears controlled.  PAD: Continue Eliquis and statin  Hyperlipidemia: On atorvastatin 10 mg daily, at goal LDL less than 70  DVT: History of recurrent venous thromboembolism.  Continue Eliquis  OSA: Continue CPAP.  RTC in 6 months  Medication  Adjustments/Labs and Tests Ordered: Current medicines are reviewed at length with the patient today.  Concerns regarding medicines are outlined  above.  No orders of the defined types were placed in this encounter.  No orders of the defined types were placed in this encounter.   Patient Instructions  Medication Instructions:  Your physician recommends that you continue on your current medications as directed. Please refer to the Current Medication list given to you today.  *If you need a refill on your cardiac medications before your next appointment, please call your pharmacy*  Follow-Up: At [facility], you and your health needs are our priority.  As part of our continuing mission to provide you with exceptional heart care, we have created designated Provider Care Teams.  These Care Teams include your primary Cardiologist (physician) and Advanced Practice Providers (APPs -  Physician Assistants and Nurse Practitioners) who all work together to provide you with the care you need, when you need it.  We recommend signing up for the patient portal called "MyChart".  Sign up information is provided on this After Visit Summary.  MyChart is used to connect with patients for Virtual Visits (Telemedicine).  Patients are able to view lab/test results, encounter notes, upcoming appointments, etc.  Non-urgent messages can be sent to your provider as well.   To learn more about what you can do with MyChart, go to [email or web address].    Your next appointment:   6-7 month(s)  The format for your next appointment:   In Person  Provider:   Oswaldo Milian, MD       Signed, Donato Heinz, MD  10/11/2020 8:29 AM    [place]

## 2020-10-11 ENCOUNTER — Ambulatory Visit (INDEPENDENT_AMBULATORY_CARE_PROVIDER_SITE_OTHER): Payer: Medicare Other | Admitting: Cardiology

## 2020-10-11 ENCOUNTER — Encounter: Payer: Self-pay | Admitting: Cardiology

## 2020-10-11 ENCOUNTER — Other Ambulatory Visit: Payer: Self-pay

## 2020-10-11 VITALS — BP 128/70 | HR 60 | Ht 72.0 in | Wt 225.8 lb

## 2020-10-11 DIAGNOSIS — I2510 Atherosclerotic heart disease of native coronary artery without angina pectoris: Secondary | ICD-10-CM

## 2020-10-11 DIAGNOSIS — I480 Paroxysmal atrial fibrillation: Secondary | ICD-10-CM | POA: Diagnosis not present

## 2020-10-11 DIAGNOSIS — E785 Hyperlipidemia, unspecified: Secondary | ICD-10-CM

## 2020-10-11 DIAGNOSIS — I10 Essential (primary) hypertension: Secondary | ICD-10-CM | POA: Diagnosis not present

## 2020-10-11 DIAGNOSIS — I77819 Aortic ectasia, unspecified site: Secondary | ICD-10-CM | POA: Diagnosis not present

## 2020-10-11 DIAGNOSIS — I739 Peripheral vascular disease, unspecified: Secondary | ICD-10-CM

## 2020-10-11 NOTE — Patient Instructions (Signed)
Medication Instructions:  Your physician recommends that you continue on your current medications as directed. Please refer to the Current Medication list given to you today.  *If you need a refill on your cardiac medications before your next appointment, please call your pharmacy*  Follow-Up: At [facility], you and your health needs are our priority.  As part of our continuing mission to provide you with exceptional heart care, we have created designated Provider Care Teams.  These Care Teams include your primary Cardiologist (physician) and Advanced Practice Providers (APPs -  Physician Assistants and Nurse Practitioners) who all work together to provide you with the care you need, when you need it.  We recommend signing up for the patient portal called "MyChart".  Sign up information is provided on this After Visit Summary.  MyChart is used to connect with patients for Virtual Visits (Telemedicine).  Patients are able to view lab/test results, encounter notes, upcoming appointments, etc.  Non-urgent messages can be sent to your provider as well.   To learn more about what you can do with MyChart, go to [email or web address].    Your next appointment:   6-7 month(s)  The format for your next appointment:   In Person  Provider:   Oswaldo Milian, MD

## 2020-10-19 DIAGNOSIS — E782 Mixed hyperlipidemia: Secondary | ICD-10-CM | POA: Diagnosis not present

## 2020-10-19 DIAGNOSIS — E559 Vitamin D deficiency, unspecified: Secondary | ICD-10-CM | POA: Diagnosis not present

## 2020-10-19 DIAGNOSIS — R252 Cramp and spasm: Secondary | ICD-10-CM | POA: Diagnosis not present

## 2020-10-19 DIAGNOSIS — R5383 Other fatigue: Secondary | ICD-10-CM | POA: Diagnosis not present

## 2020-10-19 DIAGNOSIS — E790 Hyperuricemia without signs of inflammatory arthritis and tophaceous disease: Secondary | ICD-10-CM | POA: Diagnosis not present

## 2020-10-19 DIAGNOSIS — E538 Deficiency of other specified B group vitamins: Secondary | ICD-10-CM | POA: Diagnosis not present

## 2020-10-19 DIAGNOSIS — M159 Polyosteoarthritis, unspecified: Secondary | ICD-10-CM | POA: Diagnosis not present

## 2020-10-19 DIAGNOSIS — N390 Urinary tract infection, site not specified: Secondary | ICD-10-CM | POA: Diagnosis not present

## 2020-10-19 DIAGNOSIS — I10 Essential (primary) hypertension: Secondary | ICD-10-CM | POA: Diagnosis not present

## 2020-10-19 DIAGNOSIS — I480 Paroxysmal atrial fibrillation: Secondary | ICD-10-CM | POA: Diagnosis not present

## 2020-11-22 ENCOUNTER — Other Ambulatory Visit: Payer: Self-pay | Admitting: *Deleted

## 2020-11-22 DIAGNOSIS — I480 Paroxysmal atrial fibrillation: Secondary | ICD-10-CM

## 2020-11-23 ENCOUNTER — Other Ambulatory Visit (HOSPITAL_COMMUNITY)
Admission: RE | Admit: 2020-11-23 | Discharge: 2020-11-23 | Disposition: A | Discharge status: Home / Self Care | Payer: Medicare Other | Source: Ambulatory Visit | Attending: Cardiology | Admitting: Cardiology

## 2020-11-23 ENCOUNTER — Other Ambulatory Visit: Payer: Medicare Other

## 2020-11-23 ENCOUNTER — Other Ambulatory Visit: Payer: Self-pay

## 2020-11-23 DIAGNOSIS — I480 Paroxysmal atrial fibrillation: Secondary | ICD-10-CM

## 2020-11-23 DIAGNOSIS — Z01812 Encounter for preprocedural laboratory examination: Secondary | ICD-10-CM | POA: Insufficient documentation

## 2020-11-23 DIAGNOSIS — Z20822 Contact with and (suspected) exposure to covid-19: Secondary | ICD-10-CM | POA: Diagnosis not present

## 2020-11-23 LAB — CBC
Hematocrit: 42.7 % (ref 37.5–51.0)
Hemoglobin: 14.9 g/dL (ref 13.0–17.7)
MCH: 31 pg (ref 26.6–33.0)
MCHC: 34.9 g/dL (ref 31.5–35.7)
MCV: 89 fL (ref 79–97)
Platelets: 194 10*3/uL (ref 150–450)
RBC: 4.81 x10E6/uL (ref 4.14–5.80)
RDW: 14.5 % (ref 11.6–15.4)
WBC: 5.5 10*3/uL (ref 3.4–10.8)

## 2020-11-23 LAB — BASIC METABOLIC PANEL
BUN/Creatinine Ratio: 20 (ref 10–24)
BUN: 20 mg/dL (ref 8–27)
CO2: 29 mmol/L (ref 20–29)
Calcium: 9.6 mg/dL (ref 8.6–10.2)
Chloride: 100 mmol/L (ref 96–106)
Creatinine, Ser: 1.02 mg/dL (ref 0.76–1.27)
GFR calc Af Amer: 86 mL/min/{1.73_m2} (ref >59)
GFR calc non Af Amer: 74 mL/min/{1.73_m2} (ref >59)
Glucose: 103 mg/dL — ABNORMAL HIGH (ref 65–99)
Potassium: 4.6 mmol/L (ref 3.5–5.2)
Sodium: 137 mmol/L (ref 134–144)

## 2020-11-23 LAB — SARS CORONAVIRUS 2 (TAT 6-24 HRS): SARS Coronavirus 2: NEGATIVE

## 2020-11-24 NOTE — Progress Notes (Signed)
Instructed patient on the following items: Arrival time 0830 Nothing to eat or drink after midnight No meds AM of procedure Responsible person to drive you home and stay with you for 24 hrs  Have you missed any doses of anti-coagulant Eliquis- hasn't missed any doses   

## 2020-11-25 ENCOUNTER — Ambulatory Visit (HOSPITAL_COMMUNITY): Payer: Medicare Other | Admitting: Certified Registered Nurse Anesthetist

## 2020-11-25 ENCOUNTER — Other Ambulatory Visit: Payer: Self-pay

## 2020-11-25 ENCOUNTER — Encounter (HOSPITAL_COMMUNITY)
Admission: RE | Disposition: A | Discharge status: Home / Self Care | Payer: Self-pay | Source: Home / Self Care | Attending: Cardiology

## 2020-11-25 ENCOUNTER — Ambulatory Visit (HOSPITAL_COMMUNITY)
Admission: RE | Admit: 2020-11-25 | Discharge: 2020-11-25 | Disposition: A | Discharge status: Home / Self Care | Payer: Medicare Other | Attending: Cardiology | Admitting: Cardiology

## 2020-11-25 DIAGNOSIS — Z79899 Other long term (current) drug therapy: Secondary | ICD-10-CM | POA: Insufficient documentation

## 2020-11-25 DIAGNOSIS — I480 Paroxysmal atrial fibrillation: Secondary | ICD-10-CM | POA: Diagnosis not present

## 2020-11-25 DIAGNOSIS — Z87891 Personal history of nicotine dependence: Secondary | ICD-10-CM | POA: Diagnosis not present

## 2020-11-25 DIAGNOSIS — E669 Obesity, unspecified: Secondary | ICD-10-CM | POA: Insufficient documentation

## 2020-11-25 DIAGNOSIS — Z7901 Long term (current) use of anticoagulants: Secondary | ICD-10-CM | POA: Insufficient documentation

## 2020-11-25 DIAGNOSIS — I10 Essential (primary) hypertension: Secondary | ICD-10-CM | POA: Diagnosis not present

## 2020-11-25 DIAGNOSIS — Z6830 Body mass index (BMI) 30.0-30.9, adult: Secondary | ICD-10-CM | POA: Insufficient documentation

## 2020-11-25 DIAGNOSIS — E785 Hyperlipidemia, unspecified: Secondary | ICD-10-CM | POA: Diagnosis not present

## 2020-11-25 DIAGNOSIS — G4730 Sleep apnea, unspecified: Secondary | ICD-10-CM | POA: Diagnosis not present

## 2020-11-25 HISTORY — PX: ATRIAL FIBRILLATION ABLATION: EP1191

## 2020-11-25 LAB — POCT ACTIVATED CLOTTING TIME
Activated Clotting Time: 261 seconds
Activated Clotting Time: 303 seconds

## 2020-11-25 SURGERY — ATRIAL FIBRILLATION ABLATION
Anesthesia: General

## 2020-11-25 MED ORDER — APIXABAN 5 MG PO TABS
5.0000 mg | ORAL_TABLET | Freq: Two times a day (BID) | ORAL | Status: DC
  Administered 2020-11-25: 15:00:00 5 mg via ORAL
  Filled 2020-11-25: qty 1

## 2020-11-25 MED ORDER — DEXAMETHASONE SODIUM PHOSPHATE 10 MG/ML IJ SOLN
INTRAMUSCULAR | Status: DC | PRN
  Administered 2020-11-25: 5 mg via INTRAVENOUS

## 2020-11-25 MED ORDER — SODIUM CHLORIDE 0.9 % IV SOLN: INTRAVENOUS | Status: DC

## 2020-11-25 MED ORDER — PANTOPRAZOLE SODIUM 40 MG PO TBEC
40.0000 mg | DELAYED_RELEASE_TABLET | Freq: Every day | ORAL | Status: DC
  Administered 2020-11-25: 15:00:00 40 mg via ORAL
  Filled 2020-11-25: qty 1

## 2020-11-25 MED ORDER — PROTAMINE SULFATE 10 MG/ML IV SOLN
INTRAVENOUS | Status: DC | PRN
  Administered 2020-11-25: 35 mg via INTRAVENOUS

## 2020-11-25 MED ORDER — PROPOFOL 10 MG/ML IV BOLUS
INTRAVENOUS | Status: DC | PRN
Start: 2020-11-25 — End: 2020-11-25
  Administered 2020-11-25: 170 mg via INTRAVENOUS

## 2020-11-25 MED ORDER — APIXABAN 5 MG PO TABS: 5.0000 mg | ORAL_TABLET | Freq: Two times a day (BID) | ORAL | Status: AC

## 2020-11-25 MED ORDER — EPHEDRINE SULFATE-NACL 50-0.9 MG/10ML-% IV SOSY
PREFILLED_SYRINGE | INTRAVENOUS | Status: DC | PRN
  Administered 2020-11-25 (×2): 5 mg via INTRAVENOUS

## 2020-11-25 MED ORDER — ONDANSETRON HCL 4 MG/2ML IJ SOLN
INTRAMUSCULAR | Status: DC | PRN
  Administered 2020-11-25: 4 mg via INTRAVENOUS

## 2020-11-25 MED ORDER — ACETAMINOPHEN 325 MG PO TABS: 650.0000 mg | ORAL_TABLET | ORAL | Status: DC | PRN

## 2020-11-25 MED ORDER — DILTIAZEM HCL ER COATED BEADS 180 MG PO CP24
180.0000 mg | ORAL_CAPSULE | Freq: Every day | ORAL | Status: DC
  Administered 2020-11-25: 16:00:00 180 mg via ORAL
  Filled 2020-11-25: qty 1

## 2020-11-25 MED ORDER — LACTATED RINGERS IV SOLN: INTRAVENOUS | Status: DC | PRN

## 2020-11-25 MED ORDER — ONDANSETRON HCL 4 MG/2ML IJ SOLN: 4.0000 mg | Freq: Four times a day (QID) | INTRAMUSCULAR | Status: DC | PRN

## 2020-11-25 MED ORDER — HEPARIN (PORCINE) IN NACL 1000-0.9 UT/500ML-% IV SOLN
INTRAVENOUS | Status: DC | PRN
  Administered 2020-11-25 (×4): 500 mL

## 2020-11-25 MED ORDER — PANTOPRAZOLE SODIUM 40 MG PO TBEC: 40.0000 mg | DELAYED_RELEASE_TABLET | Freq: Every day | ORAL | 0 refills | Status: DC

## 2020-11-25 MED ORDER — SODIUM CHLORIDE 0.9% FLUSH: 3.0000 mL | INTRAVENOUS | Status: DC | PRN

## 2020-11-25 MED ORDER — LIDOCAINE 2% (20 MG/ML) 5 ML SYRINGE
INTRAMUSCULAR | Status: DC | PRN
  Administered 2020-11-25: 100 mg via INTRAVENOUS

## 2020-11-25 MED ORDER — PHENYLEPHRINE HCL-NACL 10-0.9 MG/250ML-% IV SOLN
INTRAVENOUS | Status: DC | PRN
  Administered 2020-11-25: 50 ug/min via INTRAVENOUS

## 2020-11-25 MED ORDER — SUGAMMADEX SODIUM 200 MG/2ML IV SOLN
INTRAVENOUS | Status: DC | PRN
  Administered 2020-11-25: 200 mg via INTRAVENOUS
  Administered 2020-11-25: 50 mg via INTRAVENOUS

## 2020-11-25 MED ORDER — ROCURONIUM BROMIDE 100 MG/10ML IV SOLN
INTRAVENOUS | Status: DC | PRN
  Administered 2020-11-25: 10 mg via INTRAVENOUS
  Administered 2020-11-25: 100 mg via INTRAVENOUS
  Administered 2020-11-25: 20 mg via INTRAVENOUS

## 2020-11-25 MED ORDER — MULTAQ 400 MG PO TABS: 400.0000 mg | ORAL_TABLET | Freq: Two times a day (BID) | ORAL | 3 refills | Status: DC

## 2020-11-25 MED ORDER — HEPARIN SODIUM (PORCINE) 1000 UNIT/ML IJ SOLN
INTRAMUSCULAR | Status: AC
  Filled 2020-11-25: qty 1

## 2020-11-25 MED ORDER — SODIUM CHLORIDE 0.9% FLUSH: 3.0000 mL | Freq: Two times a day (BID) | INTRAVENOUS | Status: DC

## 2020-11-25 MED ORDER — HEPARIN SODIUM (PORCINE) 1000 UNIT/ML IJ SOLN
INTRAMUSCULAR | Status: DC | PRN
  Administered 2020-11-25: 1000 [IU] via INTRAVENOUS

## 2020-11-25 MED ORDER — FENTANYL CITRATE (PF) 250 MCG/5ML IJ SOLN
INTRAMUSCULAR | Status: DC | PRN
  Administered 2020-11-25: 100 ug via INTRAVENOUS

## 2020-11-25 MED ORDER — SODIUM CHLORIDE 0.9 % IV SOLN: 250.0000 mL | INTRAVENOUS | Status: DC | PRN

## 2020-11-25 MED ORDER — HEPARIN SODIUM (PORCINE) 1000 UNIT/ML IJ SOLN
INTRAMUSCULAR | Status: DC | PRN
  Administered 2020-11-25: 5000 [IU] via INTRAVENOUS
  Administered 2020-11-25: 4000 [IU] via INTRAVENOUS
  Administered 2020-11-25: 15000 [IU] via INTRAVENOUS

## 2020-11-25 SURGICAL SUPPLY — 19 items
BLANKET WARM UNDERBOD FULL ACC (MISCELLANEOUS) ×3 IMPLANT
CATH 8FR REPROCESSED SOUNDSTAR (CATHETERS) ×3 IMPLANT
CATH MAPPNG PENTARAY F 2-6-2MM (CATHETERS) ×1 IMPLANT
CATH S CIRCA THERM PROBE 10F (CATHETERS) ×3 IMPLANT
CATH SMTCH THERMOCOOL SF DF (CATHETERS) ×6 IMPLANT
CATH WEB BI DIR CSDF CRV REPRO (CATHETERS) ×3 IMPLANT
CLOSURE PERCLOSE PROSTYLE (VASCULAR PRODUCTS) ×9 IMPLANT
COVER SWIFTLINK CONNECTOR (BAG) ×3 IMPLANT
PACK EP LATEX FREE (CUSTOM PROCEDURE TRAY) ×3
PACK EP LF (CUSTOM PROCEDURE TRAY) ×1 IMPLANT
PAD PRO RADIOLUCENT 2001M-C (PAD) ×3 IMPLANT
PATCH CARTO3 (PAD) ×3 IMPLANT
PENTARAY F 2-6-2MM (CATHETERS) ×3
SHEATH BAYLIS TRANSSEPTAL 98CM (NEEDLE) ×3 IMPLANT
SHEATH CARTO VIZIGO SM CVD (SHEATH) ×3 IMPLANT
SHEATH PINNACLE 8F 10CM (SHEATH) ×6 IMPLANT
SHEATH PINNACLE 9F 10CM (SHEATH) ×3 IMPLANT
SHEATH PROBE COVER 6X72 (BAG) ×3 IMPLANT
TUBING SMART ABLATE COOLFLOW (TUBING) ×3 IMPLANT

## 2020-11-25 NOTE — Transfer of Care (Signed)
Immediate Anesthesia Transfer of Care Note  Patient: [name]  Procedure(s) Performed: ATRIAL FIBRILLATION ABLATION (N/A )  Patient Location: PACU and Cath Lab  Anesthesia Type:General  Level of Consciousness: drowsy and patient cooperative  Airway & Oxygen Therapy: Patient Spontanous Breathing and Patient connected to nasal cannula oxygen  Post-op Assessment: Report given to RN and Post -op Vital signs reviewed and stable  Post vital signs: Reviewed  Last Vitals:  Vitals Value Taken Time  BP    Temp    Pulse    Resp    SpO2      Last Pain:  Vitals:   11/25/20 0805  TempSrc: Oral      Patients Stated Pain Goal: 4 (96/72/89 7915)  Complications: No complications documented.

## 2020-11-25 NOTE — Discharge Instructions (Signed)
Post procedure care instructions No driving for 4 days. No lifting over 5 lbs for 1 week. No vigorous or sexual activity for 1 week. You may return to work/your usual activities on 12/02/2020. Keep procedure site clean & dry. If you notice increased pain, swelling, bleeding or pus, call/return!  You may shower after 24 hours, but no soaking in baths/hot tubs/pools for 1 week.    You have an appointment set up with the [facility].  Multiple studies have shown that being followed by a dedicated atrial fibrillation clinic in addition to the standard care you receive from your other physicians improves health. We believe that enrollment in the atrial fibrillation clinic will allow [place] to better care for you.   The phone number to the [facility] is [phone]. The clinic is staffed Monday through Friday from 8:30am to 5pm.  Parking Directions: The clinic is located in the Heart and Vascular Building connected to [facility]. 1)From [address] turn on to [address] and go to the 3rd entrance  (Heart and Vascular entrance) on the right. 2)Look to the right for Heart &Vascular Parking Garage. 3)A code for the entrance is required, for January is 1111.   4)Take the elevators to the 1st floor. Registration is in the room with the glass walls at the end of the hallway.  If you have any trouble parking or locating the clinic, please don't hesitate to call [phone].  Cardiac Ablation, Care After  This sheet gives you information about how to care for yourself after your procedure. Your health care provider may also give you more specific instructions. If you have problems or questions, contact your health care provider. What can I expect after the procedure? After the procedure, it is common to have:  Bruising around your puncture site.  Tenderness around your puncture site.  Skipped heartbeats.  Tiredness (fatigue).  Follow these instructions at  home: Puncture site care   Follow instructions from your health care provider about how to take care of your puncture site. Make sure you: ? If present, leave stitches (sutures), skin glue, or adhesive strips in place. These skin closures may need to stay in place for up to 2 weeks. If adhesive strip edges start to loosen and curl up, you may trim the loose edges. Do not remove adhesive strips completely unless your health care provider tells you to do that. ? If a large square bandage is present, this may be removed 24 hours after surgery.   Check your puncture site every day for signs of infection. Check for: ? Redness, swelling, or pain. ? Fluid or blood. If your puncture site starts to bleed, lie down on your back, apply firm pressure to the area, and contact your health care provider. ? Warmth. ? Pus or a bad smell. Driving  Do not drive for at least 4 days after your procedure or however long your health care provider recommends. (Do not resume driving if you have previously been instructed not to drive for other health reasons.)  Do not drive or use heavy machinery while taking prescription pain medicine. Activity  Avoid activities that take a lot of effort for at least 7 days after your procedure.  Do not lift anything that is heavier than 5 lb (4.5 kg) for one week.   No sexual activity for 1 week.   Return to your normal activities as told by your health care provider. Ask your health care provider what activities are  safe for you. General instructions  Take over-the-counter and prescription medicines only as told by your health care provider.  Do not use any products that contain nicotine or tobacco, such as cigarettes and e-cigarettes. If you need help quitting, ask your health care provider.  You may shower after 24 hours, but Do not take baths, swim, or use a hot tub for 1 week.   Do not drink alcohol for 24 hours after your procedure.  Keep all follow-up visits as  told by your health care provider. This is important. Contact a health care provider if:  You have redness, mild swelling, or pain around your puncture site.  You have fluid or blood coming from your puncture site that stops after applying firm pressure to the area.  Your puncture site feels warm to the touch.  You have pus or a bad smell coming from your puncture site.  You have a fever.  You have chest pain or discomfort that spreads to your neck, jaw, or arm.  You are sweating a lot.  You feel nauseous.  You have a fast or irregular heartbeat.  You have shortness of breath.  You are dizzy or light-headed and feel the need to lie down.  You have pain or numbness in the arm or leg closest to your puncture site. Get help right away if:  Your puncture site suddenly swells.  Your puncture site is bleeding and the bleeding does not stop after applying firm pressure to the area. These symptoms may represent a serious problem that is an emergency. Do not wait to see if the symptoms will go away. Get medical help right away. Call your local emergency services (911 in the [place] Do not drive yourself to the hospital. Summary  After the procedure, it is normal to have bruising and tenderness at the puncture site in your groin, neck, or forearm.  Check your puncture site every day for signs of infection.  Get help right away if your puncture site is bleeding and the bleeding does not stop after applying firm pressure to the area. This is a medical emergency. This information is not intended to replace advice given to you by your health care provider. Make sure you discuss any questions you have with your health care provider.

## 2020-11-25 NOTE — Anesthesia Postprocedure Evaluation (Signed)
Anesthesia Post Note  Patient: [name]  Procedure(s) Performed: ATRIAL FIBRILLATION ABLATION (N/A )     Patient location during evaluation: PACU Anesthesia Type: General Level of consciousness: sedated Pain management: pain level controlled Vital Signs Assessment: post-procedure vital signs reviewed and stable Respiratory status: spontaneous breathing and respiratory function stable Cardiovascular status: stable Postop Assessment: no apparent nausea or vomiting Anesthetic complications: no   No complications documented.  Last Vitals:  Vitals:   11/25/20 1324 11/25/20 1359  BP: 125/60 (!) 128/57  Pulse: 64 67  Resp: 16 15  Temp: (!) 36.3 C 36.6 C  SpO2:      Last Pain:  Vitals:   11/25/20 1359  TempSrc: Temporal  PainSc: 0-No pain                 Katai Marsico DANIEL

## 2020-11-25 NOTE — Anesthesia Preprocedure Evaluation (Addendum)
Anesthesia Evaluation  Patient identified by MRN, date of birth, ID band Patient awake    Reviewed: Allergy & Precautions, NPO status , Patient's Chart, lab work & pertinent test results  History of Anesthesia Complications Negative for: history of anesthetic complications  Airway Mallampati: II  TM Distance: >3 FB Neck ROM: Full    Dental  (+) Dental Advisory Given   Pulmonary sleep apnea and Continuous Positive Airway Pressure Ventilation , former smoker,    Pulmonary exam normal        Cardiovascular hypertension, Pt. on medications + DVT  Normal cardiovascular exam+ dysrhythmias Atrial Fibrillation   IMPRESSIONS   1. Left ventricular ejection fraction, by estimation, is 60 to 65%. The left ventricle has normal function. The left ventricle has no regional wall motion abnormalities. There is mild concentric left ventricular hypertrophy. Left ventricular diastolic  parameters were normal. 2. Right ventricular systolic function is normal. The right ventricular size is normal. There is normal pulmonary artery systolic pressure. The estimated right ventricular systolic pressure is 27.7 mmHg. 3. The mitral valve is grossly normal. Trivial mitral valve regurgitation. No evidence of mitral stenosis. 4. The aortic valve is tricuspid. There is moderate calcification of the aortic valve. There is moderate thickening of the aortic valve. Aortic valve regurgitation is mild. Mild to moderate aortic valve sclerosis/calcification is present, without any  evidence of aortic stenosis. 5. There is mild dilatation of the ascending aorta, measuring 42 mm. 6. The inferior vena cava is normal in size with greater than 50% respiratory variability, suggesting right atrial pressure of 3 mmHg.  Comparison(s): Changes from prior study are noted. EF normal 60-65%. Normal LA. Mild AI.  Chronic anticoagulation for DVT   Neuro/Psych negative  neurological ROS     GI/Hepatic negative GI ROS, Neg liver ROS,   Endo/Other  negative endocrine ROS  Renal/GU negative Renal ROS     Musculoskeletal negative musculoskeletal ROS (+)   Abdominal   Peds  Hematology negative hematology ROS (+)   Anesthesia Other Findings   Reproductive/Obstetrics                            Anesthesia Physical Anesthesia Plan  ASA: III  Anesthesia Plan: General   Post-op Pain Management:    Induction: Intravenous  PONV Risk Score and Plan: 2 and Ondansetron and Dexamethasone  Airway Management Planned: Oral ETT  Additional Equipment:   Intra-op Plan:   Post-operative Plan: Extubation in OR  Informed Consent: I have reviewed the patients History and Physical, chart, labs and discussed the procedure including the risks, benefits and alternatives for the proposed anesthesia with the patient or authorized representative who has indicated his/her understanding and acceptance.     Dental advisory given  Plan Discussed with: CRNA and Anesthesiologist  Anesthesia Plan Comments:        Anesthesia Quick Evaluation

## 2020-11-25 NOTE — Anesthesia Procedure Notes (Signed)
Procedure Name: Intubation Date/Time: 11/25/2020 10:30 AM Performed by: Janene Harvey, CRNA Pre-anesthesia Checklist: Patient identified, Emergency Drugs available, Suction available and Patient being monitored Patient Re-evaluated:Patient Re-evaluated prior to induction Oxygen Delivery Method: Circle system utilized Preoxygenation: Pre-oxygenation with 100% oxygen Induction Type: IV induction Ventilation: Mask ventilation without difficulty, Oral airway inserted - appropriate to patient size and Two handed mask ventilation required Laryngoscope Size: Mac and 4 Grade View: Grade III Tube type: Oral Tube size: 7.5 mm Number of attempts: 1 Airway Equipment and Method: Stylet and Oral airway Placement Confirmation: ETT inserted through vocal cords under direct vision,  positive ETCO2 and breath sounds checked- equal and bilateral Secured at: 23 cm Tube secured with: Tape Dental Injury: Teeth and Oropharynx as per pre-operative assessment

## 2020-11-25 NOTE — H&P (Signed)
Electrophysiology Office Note:    Date:  09/20/2020   ID:  [name], DOB 21-Jul-1951, MRN [id number]  PCP:  Lavone Orn, MD       [facility] Cardiologist:  No primary care provider on file.  [facility] Electrophysiologist:  None   Referring MD: Oliver Barre, PA   Chief Complaint: Atrial fibrillation  History of Present Illness:    [name] is a 69 y.o. male who presents for an evaluation of paroxysmal atrial fibrillation at the request of Adline Peals, PA-C. Their medical history includes history of DVT on chronic anticoagulation, hypertension, hyperlipidemia, obesity, sleep apnea on CPAP.  He was originally diagnosed with atrial fibrillation in 2017 during a 3 night stay for A. fib with RVR.  He was started on flecainide which help maintain normal rhythm for several years.  He did have a stress test in [place] which was abnormal and a left heart catheterization which ultimately showed a 60 to 70% stenosis of the first diagonal.  His flecainide was then stopped.  He subsequently developed episodes of symptomatic paroxysmal atrial fibrillation and he as started on Multaq around.  This is helped maintain normal rhythm.  He has a history of unprovoked DVTs on anticoagulation.  He tells me that he stopped his anticoagulation he will develop another DVT.      Past Medical History:  Diagnosis Date  . Arthritis    arthritis -back  . Atrial fibrillation ([place])    a. diagnosed 09/2016 - started on Cardizem CD and Flecainide. Continue Coumadin for anticoagulation.  . Cancer [facility])    cancer Prostate- surgery only  . Chronic anticoagulation 09/10/2016  . History of DVT (deep vein thrombosis)   . History of DVT of lower extremity    left leg has some residual crculation issues  . History of prostate cancer   . Hyperlipidemia 09/09/2018  . Hypertension   . Hypertensive heart disease without CHF 09/29/2016  . Obesity (BMI 30-39.9)   .  Paroxysmal atrial fibrillation (HCC)    CHA2DS2VASC score 2  . Sleep apnea    a. uses CPAP  . Sleep apnea in adult          Past Surgical History:  Procedure Laterality Date  . BALLOON DILATION N/A 10/12/2015   Procedure: BALLOON DILATION;  Surgeon: Garlan Fair, MD;  Location: [facility];  Service: Endoscopy;  Laterality: N/A;  . COLONOSCOPY W/ POLYPECTOMY     x2 colon polyps found  . COLONOSCOPY WITH PROPOFOL N/A 10/12/2015   Procedure: COLONOSCOPY WITH PROPOFOL;  Surgeon: Garlan Fair, MD;  Location: [facility];  Service: Endoscopy;  Laterality: N/A;  . ESOPHAGOGASTRODUODENOSCOPY (EGD) WITH PROPOFOL N/A 10/12/2015   Procedure: ESOPHAGOGASTRODUODENOSCOPY (EGD) WITH PROPOFOL;  Surgeon: Garlan Fair, MD;  Location: [facility];  Service: Endoscopy;  Laterality: N/A;  . SHOULDER ARTHROTOMY     x2 procedures- 1 open, 1 scope.  . TONSILLECTOMY    . TRANSURETHRAL RESECTION OF PROSTATE      Current Medications: Active Medications      Current Meds  Medication Sig  . apixaban (ELIQUIS) 5 MG TABS tablet Take 5 mg by mouth 2 (two) times daily.  Marland Kitchen atorvastatin (LIPITOR) 10 MG tablet Take 10 mg by mouth daily.  . calcium-vitamin D (OSCAL WITH D) 500-200 MG-UNIT per tablet Take 1 tablet by mouth 2 (two) times daily with a meal.   . cholecalciferol (VITAMIN D) 1000 UNITS tablet Take 1,000 Units by mouth 2 (two) times daily with  a meal.  . Coenzyme Q10 (COQ-10) 200 MG CAPS Take 1 capsule by mouth daily.  Marland Kitchen diltiazem (DILT-XR) 180 MG 24 hr capsule Take 1 capsule (180 mg total) by mouth daily.  Marland Kitchen dronedarone (MULTAQ) 400 MG tablet Take 1 tablet (400 mg total) by mouth 2 (two) times daily with a meal.  . FOLIC ACID PO Take [id number] mcg by mouth daily.  Marland Kitchen lisinopril-hydrochlorothiazide (ZESTORETIC) 20-25 MG tablet Take 1 tablet by mouth daily.  . polyethylene glycol (MIRALAX) 17 g packet Take 17 g by mouth 2 (two) times daily.  . potassium chloride SA  (K-DUR,KLOR-CON) 20 MEQ tablet Take 20 mEq by mouth daily with lunch.   . Testosterone 20.25 MG/ACT (1.62%) GEL 3 Pump daily.  . vitamin B-12 (CYANOCOBALAMIN) 1000 MCG tablet Take 1,000 mcg by mouth daily.       Allergies:   Scallops [shellfish allergy]   Social History        Socioeconomic History  . Marital status: Married    Spouse name: Not on file  . Number of children: Not on file  . Years of education: Not on file  . Highest education level: Not on file  Occupational History  . Not on file  Tobacco Use  . Smoking status: Former Smoker    Years: 15.00    Types: Cigarettes    Quit date: 10/08/2011    Years since quitting: 8.9  . Smokeless tobacco: Never Used  Substance and Sexual Activity  . Alcohol use: No  . Drug use: No  . Sexual activity: Not on file  Other Topics Concern  . Not on file  Social History Narrative  . Not on file   Social Determinants of Health      Financial Resource Strain:   . Difficulty of Paying Living Expenses: Not on file  Food Insecurity:   . Worried About Charity fundraiser in the Last Year: Not on file  . Ran Out of Food in the Last Year: Not on file  Transportation Needs:   . Lack of Transportation (Medical): Not on file  . Lack of Transportation (Non-Medical): Not on file  Physical Activity:   . Days of Exercise per Week: Not on file  . Minutes of Exercise per Session: Not on file  Stress:   . Feeling of Stress : Not on file  Social Connections:   . Frequency of Communication with Friends and Family: Not on file  . Frequency of Social Gatherings with Friends and Family: Not on file  . Attends Religious Services: Not on file  . Active Member of Clubs or Organizations: Not on file  . Attends Archivist Meetings: Not on file  . Marital Status: Not on file     Family History: The patient's family history includes Hypertension in his brother and sister; Prostate cancer in his brother.  ROS:    Please see the history of present illness.    All other systems reviewed and are negative.  EKGs/Labs/Other Studies Reviewed:    The following studies were reviewed today: Prior records, echo  September 08, 2020 echo personally reviewed Normal left ventricular function Prominent aortic root No significant valvular abnormalities   EKG:  The ekg ordered today demonstrates normal sinus rhythm.  Absence of septal Q wave in V6.  QRS duration 86 ms.  QTc 417 ms.   Recent Labs: 09/03/2020: ALT 18; BUN 13; Creatinine, Ser 0.88; Hemoglobin 16.8; Platelets 188; Potassium 4.2; Sodium 132  Recent Lipid Panel Labs (  Brief)          Component Value Date/Time   CHOL 169 09/09/2016 0421   TRIG 77 09/09/2016 0421   HDL 49 09/09/2016 0421   CHOLHDL 3.4 09/09/2016 0421   VLDL 15 09/09/2016 0421   LDLCALC 105 (H) 09/09/2016 0421      Physical Exam:    VS:  BP 106/64   Pulse 63   Ht 6' (1.829 m)   Wt 225 lb 6.4 oz (102.2 kg)   SpO2 97%   BMI 30.57 kg/m        Wt Readings from Last 3 Encounters:  09/20/20 225 lb 6.4 oz (102.2 kg)  09/09/20 223 lb 3.2 oz (101.2 kg)  09/03/20 225 lb 12.8 oz (102.4 kg)     GEN:  Well nourished, well developed in no acute distress HEENT: Normal NECK: No JVD; No carotid bruits LYMPHATICS: No lymphadenopathy CARDIAC: RRR, no murmurs, rubs, gallops RESPIRATORY:  Clear to auscultation without rales, wheezing or rhonchi  ABDOMEN: Soft, non-tender, non-distended MUSCULOSKELETAL:  No edema; No deformity  SKIN: Warm and dry NEUROLOGIC:  Alert and oriented x 3 PSYCHIATRIC:  Normal affect   ASSESSMENT:    1. Paroxysmal atrial fibrillation (HCC)   2. Obesity (BMI 30-39.9)   3. High risk medication use    PLAN:    In order of problems listed above:  1. Paroxysmal atrial fibrillation Patient has a CHA2DS2-VASc of 3 on Eliquis (hypertension, coronary disease, age).  Maintaining normal rhythm on dronedarone and diltiazem.  I  discussed options for rhythm control with the patient including continued antiarrhythmic use versus ablation.  Given the highly symptomatic nature of his atrial fibrillation episodes and his desire to avoid associated off target effects from antiarrhythmic therapy, the patient has elected to pursue ablation of his atrial fibrillation.  Risk, benefits, and alternatives to EP study and radiofrequency ablation for afib were also discussed in detail today. These risks include but are not limited to stroke, bleeding, vascular damage, tamponade, perforation, damage to the esophagus, lungs, and other structures, pulmonary vein stenosis, worsening renal function, and death. The patient understands these risk and wishes to proceed.  We will therefore proceed with catheter ablation at the next available time.  Carto, ICE, anesthesia are requested for the procedure.     ----------------------------------------------------------------------- I have seen, examined the patient, and reviewed the above assessment and plan.    Plan for PVI as above.  Vickie Epley, MD 11/25/2020 9:53 AM

## 2020-11-29 ENCOUNTER — Telehealth: Payer: Self-pay | Admitting: Cardiology

## 2020-11-29 ENCOUNTER — Encounter (HOSPITAL_COMMUNITY): Payer: Self-pay | Admitting: Cardiology

## 2020-11-29 NOTE — Telephone Encounter (Signed)
Called patient back- he has spoken with Dr.Lambert's office as well since he had ablation on Thursday last week patient states he has had pain in his left leg and he thinks he has a blood clot, patient denies redness or swelling, but does mention it feels that something is pushing down on his leg when he does any movement. He states he has a major history of blood clots- and would like a scan to evaluate. Patient has not missed any doses of his Eliquis, but also mentions this morning when he woke up he had a 37 pulse rate- walked around to get it into the 40's and now it is in the 50's. I did mention to patient to go to urgent care/ER to be evaluated but patient declines doing this- and wants to have a message sent to MD to advise of other recommendations. I advised I would do this, and call patient back. Patient was thankful.

## 2020-11-29 NOTE — Telephone Encounter (Signed)
Spoke to patient, aware of recommendations and verbalized understanding.  He states he has appt with PA at PCP tomorrow morning.  Again encouraged ED or UC evaluation per MD.

## 2020-11-29 NOTE — Telephone Encounter (Signed)
Agree with recommendation from Dr Lovena Neighbours office for ED/urgent care evaluation.  Should be evaluated to make sure no evidence of infection and can check LE duplex to rule out DVT.

## 2020-11-29 NOTE — Telephone Encounter (Signed)
New message:    Patient had a procedure done Thursdays and patient thinks he has blood clot in his left leg and patient would like to speak with a nurse.

## 2020-11-29 NOTE — Telephone Encounter (Signed)
Patient states he had an ablation on Thursday and previous has had blood clots. He states he has a log of pain in his left leg and thinks he has a blood clot. He states he is not having any swelling in leg.

## 2020-11-29 NOTE — Telephone Encounter (Signed)
The patient is having pain below his cath site from his ablation on Thursday. The cath site it self has a little bruising but is soft and not painful. Bilateral feet are cool but the same tempeture, he is barefoot. There doesn't appear to be any swelling. There is not numbness or tingling. His legs are different colors but states it has been this way since his large blood clot that was the length of his leg in the 90's.  Advised the patient to go to urgent care or the ER to be evaluated. He verbalized understanding.

## 2020-11-30 ENCOUNTER — Emergency Department (INDEPENDENT_AMBULATORY_CARE_PROVIDER_SITE_OTHER): Payer: Medicare Other

## 2020-11-30 ENCOUNTER — Emergency Department (INDEPENDENT_AMBULATORY_CARE_PROVIDER_SITE_OTHER)
Admission: EM | Admit: 2020-11-30 | Discharge: 2020-11-30 | Disposition: A | Discharge status: Home / Self Care | Payer: Medicare Other | Source: Home / Self Care | Attending: Internal Medicine | Admitting: Internal Medicine

## 2020-11-30 ENCOUNTER — Other Ambulatory Visit: Payer: Self-pay

## 2020-11-30 DIAGNOSIS — R1032 Left lower quadrant pain: Secondary | ICD-10-CM

## 2020-11-30 DIAGNOSIS — I82812 Embolism and thrombosis of superficial veins of left lower extremities: Secondary | ICD-10-CM

## 2020-11-30 DIAGNOSIS — I82532 Chronic embolism and thrombosis of left popliteal vein: Secondary | ICD-10-CM | POA: Diagnosis not present

## 2020-11-30 DIAGNOSIS — I82512 Chronic embolism and thrombosis of left femoral vein: Secondary | ICD-10-CM | POA: Diagnosis not present

## 2020-11-30 NOTE — ED Triage Notes (Signed)
Pt c/o LT leg pain since ablation last week. Done by Dr Lalla Brothers. Hx of blood clots. Does take eliquis BID. Cardiologist also concerned about possible infection. Pain 2/10

## 2020-11-30 NOTE — ED Provider Notes (Signed)
[name]    CSN: [id number] Arrival date & time: 11/30/20  0759      History   Chief Complaint Chief Complaint  Patient presents with  . Leg Pain    LT    HPI [name] is a 69 y.o. male comes to urgent care to be evaluated for left groin pain.  Patient had cardiac ablation last week and has since been complaining..  Pain is sharp aggravated by activity, relieved when he sits still and no radiation of pain.  Pain is localized to the groin area.  Ablation was done recently normal axis.  Patient has a history of deep vein thrombosis currently on Eliquis.  His anticoagulation was interrupted briefly for the cardiac ablation procedure.  He is concerned that he may have developed another embolic event after the ablation.  He was advised by his electrophysiologist to be evaluated.  Patient denies any chest pain.  No shortness of breath or palpitations.  No chest pressure.Marland Kitchen   HPI  Past Medical History:  Diagnosis Date  . Arthritis    arthritis -back  . Atrial fibrillation (HCC)    a. diagnosed 09/2016 - started on Cardizem CD and Flecainide. Continue Coumadin for anticoagulation.  . Cancer [facility])    cancer Prostate- surgery only  . Chronic anticoagulation 09/10/2016  . History of DVT (deep vein thrombosis)   . History of DVT of lower extremity    left leg has some residual crculation issues  . History of prostate cancer   . Hyperlipidemia 09/09/2018  . Hypertension   . Hypertensive heart disease without CHF 09/29/2016  . Obesity (BMI 30-39.9)   . Paroxysmal atrial fibrillation (HCC)    CHA2DS2VASC score 2  . Sleep apnea    a. uses CPAP  . Sleep apnea in adult     Patient Active Problem List   Diagnosis Date Noted  . Secondary hypercoagulable state (HCC) 09/03/2020  . High risk medication use 09/17/2018  . Hyperlipidemia 09/09/2018  . Hypertensive heart disease without CHF 09/29/2016  . Obesity (BMI 30-39.9)   . History of DVT (deep vein thrombosis)    . Sleep apnea in adult   . History of prostate cancer   . Chronic anticoagulation 09/10/2016  . Paroxysmal atrial fibrillation [facility])     Past Surgical History:  Procedure Laterality Date  . ATRIAL FIBRILLATION ABLATION N/A 11/25/2020   Procedure: ATRIAL FIBRILLATION ABLATION;  Surgeon: Lanier Prude, MD;  Location: [facility] INVASIVE CV LAB;  Service: Cardiovascular;  Laterality: N/A;  . BALLOON DILATION N/A 10/12/2015   Procedure: BALLOON DILATION;  Surgeon: Charolett Bumpers, MD;  Location: [facility];  Service: Endoscopy;  Laterality: N/A;  . COLONOSCOPY W/ POLYPECTOMY     x2 colon polyps found  . COLONOSCOPY WITH PROPOFOL N/A 10/12/2015   Procedure: COLONOSCOPY WITH PROPOFOL;  Surgeon: Charolett Bumpers, MD;  Location: [facility];  Service: Endoscopy;  Laterality: N/A;  . ESOPHAGOGASTRODUODENOSCOPY (EGD) WITH PROPOFOL N/A 10/12/2015   Procedure: ESOPHAGOGASTRODUODENOSCOPY (EGD) WITH PROPOFOL;  Surgeon: Charolett Bumpers, MD;  Location: [facility];  Service: Endoscopy;  Laterality: N/A;  . SHOULDER ARTHROTOMY     x2 procedures- 1 open, 1 scope.  . TONSILLECTOMY    . TRANSURETHRAL RESECTION OF PROSTATE         Home Medications    Prior to Admission medications   Medication Sig Start Date End Date Taking? Authorizing Provider  apixaban (ELIQUIS) 5 MG TABS tablet Take 1 tablet (5 mg total) by mouth  2 (two) times daily. 11/25/20   Lanier Prude, MD  atorvastatin (LIPITOR) 10 MG tablet Take 10 mg by mouth at bedtime. 08/07/18   [provider]  Calcium Carbonate-Vitamin D 600-200 MG-UNIT CAPS Take 1 tablet by mouth daily with breakfast.    [provider]  Cholecalciferol (VITAMIN D) 50 MCG (2000 UT) tablet Take 2,000 Units by mouth daily.    [provider]  Coenzyme Q10 (COQ-10) 200 MG CAPS Take 200 mg by mouth at bedtime.    [provider]  diltiazem (DILT-XR) 180 MG 24 hr capsule Take 1 capsule (180 mg total) by mouth daily. Patient  taking differently: Take 180 mg by mouth at bedtime. 08/27/20   Little Ishikawa, MD  dronedarone (MULTAQ) 400 MG tablet Take 1 tablet (400 mg total) by mouth 2 (two) times daily with a meal. 11/25/20   Lanier Prude, MD  FOLIC ACID PO Take 1,000 mcg by mouth daily.    [provider]  lisinopril-hydrochlorothiazide (ZESTORETIC) 20-25 MG tablet Take 1 tablet by mouth daily.    [provider]  pantoprazole (PROTONIX) 40 MG tablet Take 1 tablet (40 mg total) by mouth daily. 11/25/20 01/09/21  Lanier Prude, MD  polyethylene glycol (MIRALAX / GLYCOLAX) 17 g packet Take 17 g by mouth daily as needed for mild constipation or moderate constipation.    [provider]  potassium chloride SA (K-DUR,KLOR-CON) 20 MEQ tablet Take 20 mEq by mouth at bedtime.    [provider]  Testosterone 20.25 MG/ACT (1.62%) GEL Apply 3 Pump topically daily. 07/09/18   [provider]  vitamin B-12 (CYANOCOBALAMIN) 1000 MCG tablet Take 1,000 mcg by mouth daily.    [provider]    Family History Family History  Problem Relation Age of Onset  . Hypertension Sister   . Prostate cancer Brother   . Hypertension Brother     Social History Social History   Tobacco Use  . Smoking status: Former Smoker    Years: 15.00    Types: Cigarettes    Quit date: 12/04/1998    Years since quitting: 22.0  . Smokeless tobacco: Never Used  Substance Use Topics  . Alcohol use: No  . Drug use: No     Allergies   Scallops [shellfish allergy], Lactose, and Zonisamide   Review of Systems Review of Systems  Respiratory: Negative for cough, shortness of breath and wheezing.   Genitourinary: Negative for flank pain, genital sores, scrotal swelling and testicular pain.  Musculoskeletal: Negative for back pain, joint swelling and myalgias.  Neurological: Negative.      Physical Exam Triage Vital Signs ED Triage Vitals [11/30/20 0811]  Enc Vitals Group      BP 127/67     Pulse Rate (!) 102     Resp 17     Temp 98.3 F (36.8 C)     Temp Source Oral     SpO2 97 %     Weight      Height      Head Circumference      Peak Flow      Pain Score 2     Pain Loc      Pain Edu?      Excl. in GC?    No data found.  Updated Vital Signs BP 127/67 (BP Location: Right Arm)   Pulse (!) 102   Temp 98.3 F (36.8 C) (Oral)   Resp 17   SpO2 97%  Visual Acuity Right Eye Distance:   Left Eye Distance:   Bilateral Distance:    Right Eye Near:   Left Eye Near:    Bilateral Near:     Physical Exam Vitals and nursing note reviewed.  Constitutional:      General: He is not in acute distress.    Appearance: He is not ill-appearing.  Cardiovascular:     Rate and Rhythm: Normal rate and regular rhythm.     Pulses: Normal pulses.     Heart sounds: Normal heart sounds.  Genitourinary:    Comments: Tenderness in the left groin area.  No pulsatile mass.  No bruising. Neurological:     Mental Status: He is alert.      UC Treatments / Results  Labs (all labs ordered are listed, but only abnormal results are displayed) Labs Reviewed - No data to display  EKG   Radiology No results found.  Procedures Procedures (including critical care time)  Medications Ordered in UC Medications - No data to display  Initial Impression / Assessment and Plan / UC Course  I have reviewed the triage vital signs and the nursing notes.  Pertinent labs & imaging results that were available during my care of the patient were reviewed by me and considered in my medical decision making (see chart for details).     1.  Left groin pain: Ultrasound of the left groin area shows superficial thrombophlebitis involving the saphenous vein and the vein thrombotic changes in the left femoral vein. Patient is encouraged to continue Follow-up with primary care physician/electrophysiologist Gentle range of motion exercises Warm compress may be helpful Patient  verbalized understanding. Final Clinical Impressions(s) / UC Diagnoses   Final diagnoses:  Left groin pain     Discharge Instructions     Gentle range of motion exercises Take Tylenol as needed for pain Warm compress will be helpful If symptoms worsen please return to the your primary care physician or cardiologist for further evaluation.    ED Prescriptions    None     PDMP not reviewed this encounter.   Chase Picket, MD 12/05/20 [phone]

## 2020-11-30 NOTE — Discharge Instructions (Signed)
Gentle range of motion exercises Take Tylenol as needed for pain Warm compress will be helpful If symptoms worsen please return to the your primary care physician or cardiologist for further evaluation.

## 2020-12-01 DIAGNOSIS — Z85828 Personal history of other malignant neoplasm of skin: Secondary | ICD-10-CM | POA: Diagnosis not present

## 2020-12-01 DIAGNOSIS — L820 Inflamed seborrheic keratosis: Secondary | ICD-10-CM | POA: Diagnosis not present

## 2020-12-01 DIAGNOSIS — L821 Other seborrheic keratosis: Secondary | ICD-10-CM | POA: Diagnosis not present

## 2020-12-08 DIAGNOSIS — L0232 Furuncle of buttock: Secondary | ICD-10-CM | POA: Diagnosis not present

## 2020-12-08 DIAGNOSIS — Z85828 Personal history of other malignant neoplasm of skin: Secondary | ICD-10-CM | POA: Diagnosis not present

## 2020-12-09 ENCOUNTER — Ambulatory Visit (HOSPITAL_COMMUNITY)
Admission: RE | Admit: 2020-12-09 | Discharge: 2020-12-09 | Disposition: A | Discharge status: Home / Self Care | Payer: Medicare Other | Source: Ambulatory Visit | Attending: Physician Assistant | Admitting: Physician Assistant

## 2020-12-09 ENCOUNTER — Other Ambulatory Visit: Payer: Self-pay

## 2020-12-09 VITALS — BP 130/68 | HR 56 | Ht 72.0 in | Wt 228.4 lb

## 2020-12-09 DIAGNOSIS — Z79899 Other long term (current) drug therapy: Secondary | ICD-10-CM | POA: Diagnosis not present

## 2020-12-09 DIAGNOSIS — I2510 Atherosclerotic heart disease of native coronary artery without angina pectoris: Secondary | ICD-10-CM | POA: Insufficient documentation

## 2020-12-09 DIAGNOSIS — E669 Obesity, unspecified: Secondary | ICD-10-CM | POA: Diagnosis not present

## 2020-12-09 DIAGNOSIS — I480 Paroxysmal atrial fibrillation: Secondary | ICD-10-CM | POA: Diagnosis not present

## 2020-12-09 DIAGNOSIS — I10 Essential (primary) hypertension: Secondary | ICD-10-CM | POA: Diagnosis not present

## 2020-12-09 DIAGNOSIS — Z7901 Long term (current) use of anticoagulants: Secondary | ICD-10-CM | POA: Insufficient documentation

## 2020-12-09 DIAGNOSIS — G4733 Obstructive sleep apnea (adult) (pediatric): Secondary | ICD-10-CM | POA: Insufficient documentation

## 2020-12-09 DIAGNOSIS — Z86718 Personal history of other venous thrombosis and embolism: Secondary | ICD-10-CM | POA: Insufficient documentation

## 2020-12-09 DIAGNOSIS — I4892 Unspecified atrial flutter: Secondary | ICD-10-CM | POA: Diagnosis not present

## 2020-12-09 DIAGNOSIS — D6869 Other thrombophilia: Secondary | ICD-10-CM

## 2020-12-09 DIAGNOSIS — Z6830 Body mass index (BMI) 30.0-30.9, adult: Secondary | ICD-10-CM | POA: Insufficient documentation

## 2020-12-09 DIAGNOSIS — Z87891 Personal history of nicotine dependence: Secondary | ICD-10-CM | POA: Diagnosis not present

## 2020-12-09 NOTE — Progress Notes (Signed)
Primary Care Physician: Lavone Orn, MD Primary Cardiologist: Dr Gardiner Rhyme Primary Electrophysiologist: Dr Quentin Ore Referring Physician: Dr Thurnell Garbe [name] is a 70 y.o. male with a history of CAD, HTN, HLD, OSA, DVT, atrial flutter, and paroxysmal atrial fibrillation who presents for follow up in the [facility].  The patient was initially diagnosed with atrial fibrillation 09/2016 and was admitted. He was started on flecainide and converted to SR after the first dose. He was maintained on flecainide for years but he had a stress test in [place] which was abnormal and LHC showed 60-70% stenosis of D1. His flecainide was stopped 08/25/20 due to presence of CAD. Patient is on Eliquis for a CHADS2VASC score of 3. Since stopping the flecainide, he has had two episodes of afib with symptoms of heart racing and "feeling off."  There were no specific triggers that he could identify.   On follow up today, patient is s/p afib ablation with Dr Quentin Ore on 11/25/20. He does report that he has had "mini" afib episodes since the ablation but nothing sustained or very symptomatic. He did present to the ED with concern for groin cath site pain and workup was negative for pseudoaneurysm. The pain has completely resolved. He denies CP or swallowing pain.   Today, he denies symptoms of chest pain, shortness of breath, orthopnea, PND, lower extremity edema, dizziness, presyncope, syncope, bleeding, or neurologic sequela. The patient is tolerating medications without difficulties and is otherwise without complaint today.    Atrial Fibrillation Risk Factors:  he does have symptoms or diagnosis of sleep apnea. he is compliant with CPAP therapy. he does not have a history of rheumatic fever.   he has a BMI of Body mass index is 30.98 kg/m.Marland Kitchen Filed Weights   12/09/20 0849  Weight: 103.6 kg    Family History  Problem Relation Age of Onset  . Hypertension Sister   .  Prostate cancer Brother   . Hypertension Brother      Atrial Fibrillation Management history:  Previous antiarrhythmic drugs: flecainide, Multaq Previous cardioversions: none Previous ablations: 11/25/20 CHADS2VASC score: 3 Anticoagulation history: Eliquis   Past Medical History:  Diagnosis Date  . Arthritis    arthritis -back  . Atrial fibrillation ([place])    a. diagnosed 09/2016 - started on Cardizem CD and Flecainide. Continue Coumadin for anticoagulation.  . Cancer [facility])    cancer Prostate- surgery only  . Chronic anticoagulation 09/10/2016  . History of DVT (deep vein thrombosis)   . History of DVT of lower extremity    left leg has some residual crculation issues  . History of prostate cancer   . Hyperlipidemia 09/09/2018  . Hypertension   . Hypertensive heart disease without CHF 09/29/2016  . Obesity (BMI 30-39.9)   . Paroxysmal atrial fibrillation (HCC)    CHA2DS2VASC score 2  . Sleep apnea    a. uses CPAP  . Sleep apnea in adult    Past Surgical History:  Procedure Laterality Date  . ATRIAL FIBRILLATION ABLATION N/A 11/25/2020   Procedure: ATRIAL FIBRILLATION ABLATION;  Surgeon: Vickie Epley, MD;  Location: [place] [facility];  Service: Cardiovascular;  Laterality: N/A;  . BALLOON DILATION N/A 10/12/2015   Procedure: BALLOON DILATION;  Surgeon: Garlan Fair, MD;  Location: [facility];  Service: Endoscopy;  Laterality: N/A;  . COLONOSCOPY W/ POLYPECTOMY     x2 colon polyps found  . COLONOSCOPY WITH PROPOFOL N/A 10/12/2015   Procedure: COLONOSCOPY WITH PROPOFOL;  Surgeon: Charolett Bumpers, MD;  Location: [facility];  Service: Endoscopy;  Laterality: N/A;  . ESOPHAGOGASTRODUODENOSCOPY (EGD) WITH PROPOFOL N/A 10/12/2015   Procedure: ESOPHAGOGASTRODUODENOSCOPY (EGD) WITH PROPOFOL;  Surgeon: Charolett Bumpers, MD;  Location: [facility];  Service: Endoscopy;  Laterality: N/A;  . SHOULDER ARTHROTOMY     x2 procedures- 1 open, 1 scope.  . TONSILLECTOMY    .  TRANSURETHRAL RESECTION OF PROSTATE      Current Outpatient Medications  Medication Sig Dispense Refill  . apixaban (ELIQUIS) 5 MG TABS tablet Take 1 tablet (5 mg total) by mouth 2 (two) times daily. 60 tablet   . atorvastatin (LIPITOR) 10 MG tablet Take 10 mg by mouth at bedtime.    . Calcium Carbonate-Vitamin D 600-200 MG-UNIT CAPS Take 1 tablet by mouth daily with breakfast.    . Cholecalciferol (VITAMIN D) 50 MCG (2000 UT) tablet Take 2,000 Units by mouth daily.    . Coenzyme Q10 (COQ-10) 200 MG CAPS Take 200 mg by mouth at bedtime.    Marland Kitchen diltiazem (DILT-XR) 180 MG 24 hr capsule Take 1 capsule (180 mg total) by mouth daily. (Patient taking differently: Take 180 mg by mouth at bedtime.) 90 capsule 3  . dronedarone (MULTAQ) 400 MG tablet Take 1 tablet (400 mg total) by mouth 2 (two) times daily with a meal. 60 tablet 3  . FOLIC ACID PO Take 1,000 mcg by mouth daily.    Marland Kitchen lisinopril-hydrochlorothiazide (ZESTORETIC) 20-25 MG tablet Take 1 tablet by mouth daily.    . pantoprazole (PROTONIX) 40 MG tablet Take 1 tablet (40 mg total) by mouth daily. 45 tablet 0  . polyethylene glycol (MIRALAX / GLYCOLAX) 17 g packet Take 17 g by mouth daily as needed for mild constipation or moderate constipation.    . potassium chloride SA (K-DUR,KLOR-CON) 20 MEQ tablet Take 20 mEq by mouth at bedtime.    . Testosterone 20.25 MG/ACT (1.62%) GEL Apply 3 Pump topically daily.  1  . vitamin B-12 (CYANOCOBALAMIN) 1000 MCG tablet Take 1,000 mcg by mouth daily.     No current facility-administered medications for this encounter.    Allergies  Allergen Reactions  . Scallops [Shellfish Allergy] Diarrhea and Nausea And Vomiting  . Lactose Diarrhea  . Zonisamide Other (See Comments)    Dizziness    Social History   Socioeconomic History  . Marital status: Married    Spouse name: Not on file  . Number of children: Not on file  . Years of education: Not on file  . Highest education level: Not on file   Occupational History  . Not on file  Tobacco Use  . Smoking status: Former Smoker    Years: 15.00    Types: Cigarettes    Quit date: 12/04/1998    Years since quitting: 22.0  . Smokeless tobacco: Never Used  Substance and Sexual Activity  . Alcohol use: No  . Drug use: No  . Sexual activity: Not on file  Other Topics Concern  . Not on file  Social History Narrative  . Not on file   Social Determinants of Health   Financial Resource Strain: Not on file  Food Insecurity: Not on file  Transportation Needs: Not on file  Physical Activity: Not on file  Stress: Not on file  Social Connections: Not on file  Intimate Partner Violence: Not on file     ROS- All systems are reviewed and negative except as per the HPI above.  Physical Exam: Vitals:  12/09/20 0849  BP: 130/68  Pulse: (!) 56  Weight: 103.6 kg  Height: 6' (1.829 m)    GEN- The patient is well appearing obese male, alert and oriented x 3 today.   HEENT-head normocephalic, atraumatic, sclera clear, conjunctiva pink, hearing intact, trachea midline. Lungs- Clear to ausculation bilaterally, normal work of breathing Heart- Regular rate and rhythm, no murmurs, rubs or gallops  GI- soft, NT, ND, + BS Extremities- no clubbing, cyanosis, or edema MS- no significant deformity or atrophy Skin- no rash or lesion Psych- euthymic mood, full affect Neuro- strength and sensation are intact   Wt Readings from Last 3 Encounters:  12/09/20 103.6 kg  11/25/20 99.8 kg  10/11/20 102.4 kg    EKG today demonstrates  SB Vent. rate 56 BPM PR interval 152 ms QRS duration 88 ms QT/QTc 422/407 ms  Echo 09/08/20 demonstrated  1. Left ventricular ejection fraction, by estimation, is 60 to 65%. The  left ventricle has normal function. The left ventricle has no regional  wall motion abnormalities. There is mild concentric left ventricular  hypertrophy. Left ventricular diastolic  parameters were normal.  2. Right ventricular  systolic function is normal. The right ventricular  size is normal. There is normal pulmonary artery systolic pressure. The  estimated right ventricular systolic pressure is [id number] mmHg.  3. The mitral valve is grossly normal. Trivial mitral valve  regurgitation. No evidence of mitral stenosis.  4. The aortic valve is tricuspid. There is moderate calcification of the  aortic valve. There is moderate thickening of the aortic valve. Aortic  valve regurgitation is mild. Mild to moderate aortic valve  sclerosis/calcification is present, without any  evidence of aortic stenosis.  5. There is mild dilatation of the ascending aorta, measuring 42 mm.  6. The inferior vena cava is normal in size with greater than 50%  respiratory variability, suggesting right atrial pressure of 3 mmHg.   Comparison(s): Changes from prior study are noted. EF normal 60-65%.  Normal LA. Mild AI.   Epic records are reviewed at length today  CHA2DS2-VASc Score = 3  The patient's score is based upon: CHF History: No HTN History: Yes Diabetes History: No Stroke History: No Vascular Disease History: Yes      ASSESSMENT AND PLAN: 1. Paroxysmal Atrial Fibrillation/atrial flutter The patient's CHA2DS2-VASc score is 3, indicating a 3.2% annual risk of stroke.   S/p afib ablation with Dr Quentin Ore 11/25/20 Reassured patient that some episodes of afib are not uncommon for the first 3 months post ablation.  Continue Multaq 400 mg BID for now. Anticipate this will be short term. Continue diltiazem 180 mg daily Continue Eliquis 5 mg BID with no missed doses for at least 3 months post ablation.   2. Secondary Hypercoagulable State (ICD10:  D68.69) The patient is at significant risk for stroke/thromboembolism based upon his CHA2DS2-VASc Score of 3.  Continue Apixaban (Eliquis).   3. Obesity Body mass index is 30.98 kg/m. Lifestyle modification was discussed and encouraged including regular physical activity and  weight reduction.  4. Obstructive sleep apnea Patient reports compliance with CPAP therapy.  5. CAD No anginal symptoms.  6. HTN Stable, no changes today.   Follow up with Dr Quentin Ore per recall. Patient also to see his cardiologist in [place], will be there until May.   [facility] [address] [place], [place] [address] [phone] 12/09/2020 9:09 AM

## 2021-01-03 DIAGNOSIS — M810 Age-related osteoporosis without current pathological fracture: Secondary | ICD-10-CM | POA: Diagnosis not present

## 2021-01-03 DIAGNOSIS — I4891 Unspecified atrial fibrillation: Secondary | ICD-10-CM | POA: Diagnosis not present

## 2021-01-03 DIAGNOSIS — Q613 Polycystic kidney, unspecified: Secondary | ICD-10-CM | POA: Diagnosis not present

## 2021-01-03 DIAGNOSIS — G8929 Other chronic pain: Secondary | ICD-10-CM | POA: Diagnosis not present

## 2021-01-03 DIAGNOSIS — M1811 Unilateral primary osteoarthritis of first carpometacarpal joint, right hand: Secondary | ICD-10-CM | POA: Diagnosis not present

## 2021-01-03 DIAGNOSIS — I480 Paroxysmal atrial fibrillation: Secondary | ICD-10-CM | POA: Diagnosis not present

## 2021-01-03 DIAGNOSIS — I2510 Atherosclerotic heart disease of native coronary artery without angina pectoris: Secondary | ICD-10-CM | POA: Diagnosis not present

## 2021-01-03 DIAGNOSIS — I10 Essential (primary) hypertension: Secondary | ICD-10-CM | POA: Diagnosis not present

## 2021-01-03 DIAGNOSIS — Z8546 Personal history of malignant neoplasm of prostate: Secondary | ICD-10-CM | POA: Diagnosis not present

## 2021-01-24 DIAGNOSIS — I10 Essential (primary) hypertension: Secondary | ICD-10-CM | POA: Diagnosis not present

## 2021-01-24 DIAGNOSIS — Z7182 Exercise counseling: Secondary | ICD-10-CM | POA: Diagnosis not present

## 2021-01-24 DIAGNOSIS — I480 Paroxysmal atrial fibrillation: Secondary | ICD-10-CM | POA: Diagnosis not present

## 2021-01-24 DIAGNOSIS — E785 Hyperlipidemia, unspecified: Secondary | ICD-10-CM | POA: Diagnosis not present

## 2021-01-24 DIAGNOSIS — I2510 Atherosclerotic heart disease of native coronary artery without angina pectoris: Secondary | ICD-10-CM | POA: Diagnosis not present

## 2021-02-01 DIAGNOSIS — H2513 Age-related nuclear cataract, bilateral: Secondary | ICD-10-CM | POA: Diagnosis not present

## 2021-02-02 DIAGNOSIS — M810 Age-related osteoporosis without current pathological fracture: Secondary | ICD-10-CM | POA: Diagnosis not present

## 2021-02-02 DIAGNOSIS — I10 Essential (primary) hypertension: Secondary | ICD-10-CM | POA: Diagnosis not present

## 2021-02-02 DIAGNOSIS — G8929 Other chronic pain: Secondary | ICD-10-CM | POA: Diagnosis not present

## 2021-02-02 DIAGNOSIS — I2510 Atherosclerotic heart disease of native coronary artery without angina pectoris: Secondary | ICD-10-CM | POA: Diagnosis not present

## 2021-02-02 DIAGNOSIS — Z8546 Personal history of malignant neoplasm of prostate: Secondary | ICD-10-CM | POA: Diagnosis not present

## 2021-02-02 DIAGNOSIS — M1811 Unilateral primary osteoarthritis of first carpometacarpal joint, right hand: Secondary | ICD-10-CM | POA: Diagnosis not present

## 2021-02-02 DIAGNOSIS — Q613 Polycystic kidney, unspecified: Secondary | ICD-10-CM | POA: Diagnosis not present

## 2021-02-02 DIAGNOSIS — I480 Paroxysmal atrial fibrillation: Secondary | ICD-10-CM | POA: Diagnosis not present

## 2021-02-09 DIAGNOSIS — E538 Deficiency of other specified B group vitamins: Secondary | ICD-10-CM | POA: Diagnosis not present

## 2021-02-09 DIAGNOSIS — E559 Vitamin D deficiency, unspecified: Secondary | ICD-10-CM | POA: Diagnosis not present

## 2021-02-09 DIAGNOSIS — N390 Urinary tract infection, site not specified: Secondary | ICD-10-CM | POA: Diagnosis not present

## 2021-02-09 DIAGNOSIS — R5383 Other fatigue: Secondary | ICD-10-CM | POA: Diagnosis not present

## 2021-02-09 DIAGNOSIS — I10 Essential (primary) hypertension: Secondary | ICD-10-CM | POA: Diagnosis not present

## 2021-02-09 DIAGNOSIS — E782 Mixed hyperlipidemia: Secondary | ICD-10-CM | POA: Diagnosis not present

## 2021-02-09 DIAGNOSIS — E790 Hyperuricemia without signs of inflammatory arthritis and tophaceous disease: Secondary | ICD-10-CM | POA: Diagnosis not present

## 2021-02-15 DIAGNOSIS — C61 Malignant neoplasm of prostate: Secondary | ICD-10-CM | POA: Diagnosis not present

## 2021-02-15 DIAGNOSIS — K5900 Constipation, unspecified: Secondary | ICD-10-CM | POA: Diagnosis not present

## 2021-02-15 DIAGNOSIS — E790 Hyperuricemia without signs of inflammatory arthritis and tophaceous disease: Secondary | ICD-10-CM | POA: Diagnosis not present

## 2021-02-15 DIAGNOSIS — I480 Paroxysmal atrial fibrillation: Secondary | ICD-10-CM | POA: Diagnosis not present

## 2021-02-15 DIAGNOSIS — N390 Urinary tract infection, site not specified: Secondary | ICD-10-CM | POA: Diagnosis not present

## 2021-02-15 DIAGNOSIS — E782 Mixed hyperlipidemia: Secondary | ICD-10-CM | POA: Diagnosis not present

## 2021-02-15 DIAGNOSIS — R5383 Other fatigue: Secondary | ICD-10-CM | POA: Diagnosis not present

## 2021-02-15 DIAGNOSIS — I10 Essential (primary) hypertension: Secondary | ICD-10-CM | POA: Diagnosis not present

## 2021-02-15 DIAGNOSIS — E538 Deficiency of other specified B group vitamins: Secondary | ICD-10-CM | POA: Diagnosis not present

## 2021-02-15 DIAGNOSIS — E291 Testicular hypofunction: Secondary | ICD-10-CM | POA: Diagnosis not present

## 2021-02-15 DIAGNOSIS — E559 Vitamin D deficiency, unspecified: Secondary | ICD-10-CM | POA: Diagnosis not present

## 2021-02-25 ENCOUNTER — Other Ambulatory Visit: Payer: Self-pay

## 2021-02-25 ENCOUNTER — Telehealth (INDEPENDENT_AMBULATORY_CARE_PROVIDER_SITE_OTHER): Payer: Medicare Other | Admitting: Cardiology

## 2021-02-25 DIAGNOSIS — I480 Paroxysmal atrial fibrillation: Secondary | ICD-10-CM

## 2021-02-25 NOTE — Progress Notes (Addendum)
Virtual Visit via Telephone Note   This visit type was conducted due to national recommendations for restrictions regarding the COVID-19 Pandemic (e.g. social distancing) in an effort to limit this patient's exposure and mitigate transmission in our community.  Due to his co-morbid illnesses, this patient is at least at moderate risk for complications without adequate follow up.  This format is felt to be most appropriate for this patient at this time.  The patient did not have access to video technology/had technical difficulties with video requiring transitioning to audio format only (telephone).  All issues noted in this document were discussed and addressed.  No physical exam could be performed with this format.  Please refer to the patient's chart for his  consent to telehealth for [facility].    Date:  02/25/2021   ID:  [name], DOB 1951-03-21, MRN [id number] The patient was identified using 2 identifiers.  Patient Location: Home Provider Location: Office/Clinic   PCP:  Lavone Orn, [place]  Cardiologist:  No primary care provider on file.  Advanced Practice Provider:  No care team member to display Electrophysiologist:  Vickie Epley, MD     Evaluation Performed:  Follow-Up Visit  Chief Complaint:  AF s/p ablation  History of Present Illness:    [name] is a 70 y.o. male with paroxysmal atrial fibrillation and flutter who I am seeing in follow-up after an ablation on November 25, 2020.  He has had no sustained episodes of atrial fibrillation since the ablation.  Groin sites have healed well.  He continues to take dronedarone.  He is on Eliquis for stroke prophylaxis.    Past Medical History:  Diagnosis Date  . Arthritis    arthritis -back  . Atrial fibrillation ([place])    a. diagnosed 09/2016 - started on Cardizem CD and Flecainide. Continue Coumadin for anticoagulation.  . Cancer [facility])    cancer Prostate-  surgery only  . Chronic anticoagulation 09/10/2016  . History of DVT (deep vein thrombosis)   . History of DVT of lower extremity    left leg has some residual crculation issues  . History of prostate cancer   . Hyperlipidemia 09/09/2018  . Hypertension   . Hypertensive heart disease without CHF 09/29/2016  . Obesity (BMI 30-39.9)   . Paroxysmal atrial fibrillation (H[facility])    CHA2DS2VASC score 2  . Sleep apnea    a. uses CPAP  . Sleep apnea in adult    Past Surgical History:  Procedure Laterality Date  . ATRIAL FIBRILLATION ABLATION N/A 11/25/2020   Procedure: ATRIAL FIBRILLATION ABLATION;  Surgeon: Vickie Epley, MD;  Location: [place] [facility];  Service: Cardiovascular;  Laterality: N/A;  . BALLOON DILATION N/A 10/12/2015   Procedure: BALLOON DILATION;  Surgeon: Garlan Fair, MD;  [facility];  Service: Endoscopy;  Laterality: N/A;  . COLONOSCOPY W/ POLYPECTOMY     x2 colon polyps found  . COLONOSCOPY WITH PROPOFOL N/A 10/12/2015   Procedure: COLONOSCOPY WITH PROPOFOL;  Surgeon: Garlan Fair, MD;  Location: [facility];  Service: Endoscopy;  Laterality: N/A;  . ESOPHAGOGASTRODUODENOSCOPY (EGD) WITH PROPOFOL N/A 10/12/2015   Procedure: ESOPHAGOGASTRODUODENOSCOPY (EGD) WITH PROPOFOL;  Surgeon: Garlan Fair, MD;  [facility]: [facility];  Service: Endoscopy;  Laterality: N/A;  . SHOULDER ARTHROTOMY     x2 procedures- 1 open, 1 scope.  . TONSILLECTOMY    . TRANSURETHRAL RESECTION OF PROSTATE       No outpatient  medications have been marked as taking for the 02/25/21 encounter (Telemedicine) with Vickie Epley, MD.     Allergies:   Scallops [shellfish allergy], Lactose, and Zonisamide   Social History   Tobacco Use  . Smoking status: Former Smoker    Years: 15.00    Types: Cigarettes    Quit date: 12/04/1998    Years since quitting: 22.2  . Smokeless tobacco: Never Used  Substance Use Topics  . Alcohol use: No  . Drug use: No     Family  Hx: The patient's family history includes Hypertension in his brother and sister; Prostate cancer in his brother.  ROS:   Please see the history of present illness.     All other systems reviewed and are negative.   Prior CV studies:   The following studies were reviewed today:    Labs/Other Tests and Data Reviewed:    EKG:  No ECG reviewed.  Recent Labs: 09/03/2020: ALT 18 11/23/2020: BUN 20; Creatinine, Ser 1.02; Hemoglobin 14.9; Platelets 194; Potassium 4.6; Sodium 137   Recent Lipid Panel Lab Results  Component Value Date/Time   CHOL 169 09/09/2016 04:21 AM   TRIG 77 09/09/2016 04:21 AM   HDL 49 09/09/2016 04:21 AM   CHOLHDL 3.4 09/09/2016 04:21 AM   LDLCALC 105 (H) 09/09/2016 04:21 AM    Wt Readings from Last 3 Encounters:  12/09/20 228 lb 6.4 oz (103.6 kg)  11/25/20 220 lb (99.8 kg)  10/11/20 225 lb 12.8 oz (102.4 kg)      Risk Assessment/Calculations:    CHA2DS2-VASc Score = 3 This indicates a 3.2% annual risk of stroke. The patient's score is based upon: CHF History: No HTN History: Yes Diabetes History: No Stroke History: No Vascular Disease History: Yes     Objective:    Vital Signs:  There were no vitals taken for this visit.   PSYCH:  normal affect  ASSESSMENT & PLAN:    1. Paroxysmal atrial fibrillation flutter CHA2DS2-VASc of 3 on Eliquis. Doing well after his ablation November 25, 2020 Stop Multaq Continue diltiazem Follow-up 9 months / 1 year post ablation  2.  Obstructive sleep apnea Continue CPAP   Total time of encounter: 20 minutes total time of encounter including coordination of care and counseling regarding high complexity medical decision making re: antiarrhythmic therapy and anticoagulation, reviewing chart documents/testing relevant to the patient encounter and documentation in the medical record.    Medication Adjustments/Labs and Tests Ordered: Current medicines are reviewed at length with the patient today.   Concerns regarding medicines are outlined above.   Tests Ordered: No orders of the defined types were placed in this encounter.   Medication Changes: No orders of the defined types were placed in this encounter.   Follow Up:  In Person in 83 month(s)  Signed, Vickie Epley, MD  02/25/2021 8:18 AM    [place]

## 2021-02-25 NOTE — Patient Instructions (Signed)
Medication Instructions:  Your physician has recommended you make the following change in your medication:   1.  STOP Multaq  *If you need a refill on your cardiac medications before your next appointment, please call your pharmacy*  Lab Work: None ordered. If you have labs (blood work) drawn today and your tests are completely normal, you will receive your results only by: Marland Kitchen MyChart Message (if you have MyChart) OR . A paper copy in the mail If you have any lab test that is abnormal or we need to change your treatment, we will call you to review the results.  Testing/Procedures: None ordered.  Follow-Up: At [facility], you and your health needs are our priority.  As part of our continuing mission to provide you with exceptional heart care, we have created designated Provider Care Teams.  These Care Teams include your primary Cardiologist (physician) and Advanced Practice Providers (APPs -  Physician Assistants and Nurse Practitioners) who all work together to provide you with the care you need, when you need it.  Your next appointment:   Your physician wants you to follow-up in: October 2022 with Dr. Quentin Ore.

## 2021-02-25 NOTE — Addendum Note (Signed)
Addended by: Willeen Cass A on: 02/25/2021 08:33 AM   Modules accepted: Orders

## 2021-03-19 DIAGNOSIS — R6884 Jaw pain: Secondary | ICD-10-CM | POA: Diagnosis not present

## 2021-03-19 DIAGNOSIS — H9202 Otalgia, left ear: Secondary | ICD-10-CM | POA: Diagnosis not present

## 2021-03-19 DIAGNOSIS — Z7901 Long term (current) use of anticoagulants: Secondary | ICD-10-CM | POA: Diagnosis not present

## 2021-03-21 DIAGNOSIS — Z85828 Personal history of other malignant neoplasm of skin: Secondary | ICD-10-CM | POA: Diagnosis not present

## 2021-03-21 DIAGNOSIS — L905 Scar conditions and fibrosis of skin: Secondary | ICD-10-CM | POA: Diagnosis not present

## 2021-03-21 DIAGNOSIS — L812 Freckles: Secondary | ICD-10-CM | POA: Diagnosis not present

## 2021-03-21 DIAGNOSIS — L820 Inflamed seborrheic keratosis: Secondary | ICD-10-CM | POA: Diagnosis not present

## 2021-03-21 DIAGNOSIS — L578 Other skin changes due to chronic exposure to nonionizing radiation: Secondary | ICD-10-CM | POA: Diagnosis not present

## 2021-04-15 DIAGNOSIS — Z23 Encounter for immunization: Secondary | ICD-10-CM | POA: Diagnosis not present

## 2021-05-10 ENCOUNTER — Ambulatory Visit
Admission: RE | Admit: 2021-05-10 | Discharge: 2021-05-10 | Disposition: A | Discharge status: Home / Self Care | Payer: Medicare Other | Source: Ambulatory Visit | Attending: Internal Medicine | Admitting: Internal Medicine

## 2021-05-10 ENCOUNTER — Other Ambulatory Visit: Payer: Self-pay | Admitting: Internal Medicine

## 2021-05-10 DIAGNOSIS — R739 Hyperglycemia, unspecified: Secondary | ICD-10-CM | POA: Diagnosis not present

## 2021-05-10 DIAGNOSIS — M25551 Pain in right hip: Secondary | ICD-10-CM

## 2021-05-10 DIAGNOSIS — H9313 Tinnitus, bilateral: Secondary | ICD-10-CM | POA: Diagnosis not present

## 2021-05-10 DIAGNOSIS — I10 Essential (primary) hypertension: Secondary | ICD-10-CM | POA: Diagnosis not present

## 2021-05-10 DIAGNOSIS — G4733 Obstructive sleep apnea (adult) (pediatric): Secondary | ICD-10-CM | POA: Diagnosis not present

## 2021-05-10 DIAGNOSIS — E291 Testicular hypofunction: Secondary | ICD-10-CM | POA: Diagnosis not present

## 2021-05-10 DIAGNOSIS — M25552 Pain in left hip: Secondary | ICD-10-CM | POA: Diagnosis not present

## 2021-05-10 NOTE — Progress Notes (Signed)
Cardiology Office Note:    Date:  05/12/2021   ID:  [name], DOB March 07, 1951, MRN [id number]  PCP:  Lavone Orn, MD  Cardiologist:  None  Electrophysiologist:  Vickie Epley, MD   Referring MD: Lavone Orn, MD   No chief complaint on file.   History of Present Illness:    [name] is a 70 y.o. male with a hx of paroxysmal atrial fibrillation, hypertension, OSA, DVT, prostate cancer, PAD, hyperlipidemia who presents for follow-up.  He has had issues with recurrent venous thromboembolism, currently on Eliquis.  He was admitted to [facility] in October 2017 with atrial fibrillation.  He converted to sinus rhythm during admission and was started on flecainide and diltiazem.  Echocardiogram at that time showed normal LV function, moderate to severe left atrial dilatation, no significant valvular disease.  He spends 6 months/year in [place] and reports that in 2020 established with a cardiologist in [place].  Stress test was done, though patient denied having any symptoms.  Stress test was abnormal and underwent cardiac catheterization which showed 60 to 70% stenosis of D1.  Medical management was recommended.  Reports that in August 2021 he had his first recurrence of atrial fibrillation since 2017.  States that he went into AF for about 1 hour with heart rate in 150s.  Converted spontaneously.  Reports that he is active, walks 2.5 miles per day.  Denies any exertional symptoms.  Does report rare resting chest pain that he describes as left-sided sharp pain, but no exertional chest pain.  Denies any lightheadedness or syncope.  Reports that he smoked for 20 years, up to 2 packs/day but quit in 2000.  No known history of heart disease in his immediate family.  Echocardiogram 09/08/2020 showed normal biventricular function, mild AI, dilatation of the ascending aorta measuring 42 mm.  CTA chest on 09/20/2020 showed mild dilatation of the ascending aorta measuring up to 40 mm.  Underwent A.  fib ablation with Dr. Quentin Ore on 11/25/2020.  Since last clinic visit, he is now s/p ablation. He has not been in Afib since the procedure, and has gradually been recovering. He states things seem to be okay overall. He notes rubbing his skin makes it tear easily, and he presents a small lesion on his left hand. He remains compliant with anticoagulation. Some blood occasionally appears on toilet paper when wiping, but otherwise has no bleeding. His skin on the left LE is discolored, which he attributes to a previous thrombus. For exercise, he is walking (2 miles) and swimming (1 hour) about 5 days a week. He has discontinued bicycling due to knee pain. He continues to use his CPAP, and notes he sleeps terribly without it. He denies any chest pain, shortness of breath, palpitations, or exertional symptoms. No headaches, lightheadedness, or syncope to report. Also has no lower extremity edema, orthopnea or PND. He plans to travel to [place] in early November through mid-May.    Past Medical History:  Diagnosis Date   Arthritis    arthritis -back   Atrial fibrillation ([place])    a. diagnosed 09/2016 - started on Cardizem CD and Flecainide. Continue Coumadin for anticoagulation.   Cancer [facility])    cancer Prostate- surgery only   Chronic anticoagulation 09/10/2016   History of DVT (deep vein thrombosis)    History of DVT of lower extremity    left leg has some residual crculation issues   History of prostate cancer    Hyperlipidemia 09/09/2018   Hypertension  Hypertensive heart disease without CHF 09/29/2016   Obesity (BMI 30-39.9)    Paroxysmal atrial fibrillation (HCC)    CHA2DS2VASC score 2   Sleep apnea    a. uses CPAP   Sleep apnea in adult     Past Surgical History:  Procedure Laterality Date   ATRIAL FIBRILLATION ABLATION N/A 11/25/2020   Procedure: ATRIAL FIBRILLATION ABLATION;  Surgeon: Vickie Epley, MD;  Location: [facility];  Service: Cardiovascular;  Laterality:  N/A;   BALLOON DILATION N/A 10/12/2015   Procedure: BALLOON DILATION;  Surgeon: Garlan Fair, MD;  [facility]: [facility];  Service: Endoscopy;  Laterality: N/A;   COLONOSCOPY W/ POLYPECTOMY     x2 colon polyps found   COLONOSCOPY WITH PROPOFOL N/A 10/12/2015   Procedure: COLONOSCOPY WITH PROPOFOL;  Surgeon: Garlan Fair, MD;  [facility];  Service: Endoscopy;  Laterality: N/A;   ESOPHAGOGASTRODUODENOSCOPY (EGD) WITH PROPOFOL N/A 10/12/2015   Procedure: ESOPHAGOGASTRODUODENOSCOPY (EGD) WITH PROPOFOL;  Surgeon: Garlan Fair, MD;  [facility];  Service: Endoscopy;  Laterality: N/A;   SHOULDER ARTHROTOMY     x2 procedures- 1 open, 1 scope.   TONSILLECTOMY     TRANSURETHRAL RESECTION OF PROSTATE      Current Medications: Current Meds  Medication Sig   apixaban (ELIQUIS) 5 MG TABS tablet Take 1 tablet (5 mg total) by mouth 2 (two) times daily.   atorvastatin (LIPITOR) 10 MG tablet Take 10 mg by mouth at bedtime.   Calcium Carbonate-Vitamin D 600-200 MG-UNIT CAPS Take 1 tablet by mouth daily with breakfast.   Cholecalciferol (VITAMIN D) 50 MCG (2000 UT) tablet Take 2,000 Units by mouth daily.   Coenzyme Q10 (COQ-10) 200 MG CAPS Take 200 mg by mouth at bedtime.   diltiazem (DILT-XR) 180 MG 24 hr capsule Take 1 capsule (180 mg total) by mouth daily. (Patient taking differently: Take 180 mg by mouth at bedtime.)   FOLIC ACID PO Take 0,277 mcg by mouth daily.   lisinopril-hydrochlorothiazide (ZESTORETIC) 20-25 MG tablet Take 1 tablet by mouth daily.   MAGNESIUM CITRATE PO Take 500 mg by mouth in the morning.   potassium chloride SA (K-DUR,KLOR-CON) 20 MEQ tablet Take 20 mEq by mouth at bedtime.   Testosterone 20.25 MG/ACT (1.62%) GEL Apply 3 Pump topically daily.   vitamin B-12 (CYANOCOBALAMIN) 1000 MCG tablet Take 1,000 mcg by mouth daily.     Allergies:   Scallops [shellfish allergy], Lactose, and Zonisamide   Social History   Socioeconomic History    Marital status: Married    Spouse name: Not on file   Number of children: Not on file   Years of education: Not on file   Highest education level: Not on file  Occupational History   Not on file  Tobacco Use   Smoking status: Former    Years: 15.00    Pack years: 0.00    Types: Cigarettes    Quit date: 12/04/1998    Years since quitting: 22.4   Smokeless tobacco: Never  Substance and Sexual Activity   Alcohol use: No   Drug use: No   Sexual activity: Not on file  Other Topics Concern   Not on file  Social History Narrative   Not on file   Social Determinants of Health   Financial Resource Strain: Not on file  Food Insecurity: Not on file  Transportation Needs: Not on file  Physical Activity: Not on file  Stress: Not on file  Social Connections: Not on file  Family History: The patient's family history includes Hypertension in his brother and sister; Prostate cancer in his brother.  ROS:   Please see the history of present illness.    (+) Easy bleed (+) Minor knee pain (+) Discolored skin, Left LE (+) Small lesion, Left hand All other systems reviewed and are negative.  EKGs/Labs/Other Studies Reviewed:    The following studies were reviewed today:  US Venous Left LE 11/30/2020: 1. No evidence of acute DVT within the left lower extremity. 2. The examination is positive for nonocclusive wall thickening/chronic DVT involving the distal aspect of the left femoral vein and the left popliteal vein, similar to remote examination performed in 2010. 3. Chronic occlusive superficial thrombophlebitis involving the proximal aspect of the left greater saphenous vein as well as the imaged portions of the left lesser saphenous vein.  EP Study/Afib Ablation 11/25/2020: CONCLUSIONS: 1. Successful PVI 2. Intracardiac echo reveals vertical heart, normal LV function, 4 PVs and trivial pericardial effusion. 3. No early apparent complications.  CT Angio Chest  09/20/2020: 1. Ascending thoracic aortic caliber 4.0 cm axial dimension. Mildly aneurysmal. Recommend annual imaging followup by CTA or MRA. This recommendation follows 2010 ACCF/AHA/AATS/ACR/ASA/SCA/SCAI/SIR/STS/SVM Guidelines for the Diagnosis and Management of Patients with Thoracic Aortic Disease. Circulation. 2010; 121: J242-A834. Aortic aneurysm NOS (ICD10-I71.9) 2. Coronary artery disease with calcification of the LEFT coronary circulation. 3. Renal and cystic hepatic lesions. Please see prior abdominal CT for further detail regarding follow-up. 4. Aortic atherosclerosis.   Aortic Atherosclerosis (ICD10-I70.0).  Echo 09/08/2020:  1. Left ventricular ejection fraction, by estimation, is 60 to 65%. The  left ventricle has normal function. The left ventricle has no regional  wall motion abnormalities. There is mild concentric left ventricular  hypertrophy. Left ventricular diastolic  parameters were normal.   2. Right ventricular systolic function is normal. The right ventricular  size is normal. There is normal pulmonary artery systolic pressure. The  estimated right ventricular systolic pressure is 19.6 mmHg.   3. The mitral valve is grossly normal. Trivial mitral valve  regurgitation. No evidence of mitral stenosis.   4. The aortic valve is tricuspid. There is moderate calcification of the  aortic valve. There is moderate thickening of the aortic valve. Aortic  valve regurgitation is mild. Mild to moderate aortic valve  sclerosis/calcification is present, without any  evidence of aortic stenosis.   5. There is mild dilatation of the ascending aorta, measuring 42 mm.   6. The inferior vena cava is normal in size with greater than 50%  respiratory variability, suggesting right atrial pressure of 3 mmHg.   Comparison(s): Changes from prior study are noted. EF normal 60-65%.  Normal LA. Mild AI.   EKG:   05/12/2021: Sinus rhythm with frequent PACs, rate 63, no ST  abnormalities 10/11/2020: EKG was not ordered.  08/25/2020: normal sinus rhythm, rate 60, no ST/T abnormalities.    Recent Labs: 09/03/2020: ALT 18 11/23/2020: BUN 20; Creatinine, Ser 1.02; Hemoglobin 14.9; Platelets 194; Potassium 4.6; Sodium 137  Recent Lipid Panel    Component Value Date/Time   CHOL 169 09/09/2016 0421   TRIG 77 09/09/2016 0421   HDL 49 09/09/2016 0421   CHOLHDL 3.4 09/09/2016 0421   VLDL 15 09/09/2016 0421   LDLCALC 105 (H) 09/09/2016 0421    Physical Exam:    VS:  BP 108/62 (BP Location: Left Arm, Patient Position: Sitting, Cuff Size: Normal)   Pulse 63   Ht 6' (1.829 m)   Wt 227 lb 9.6 oz (  103.2 kg)   SpO2 97%   BMI 30.87 kg/m     Wt Readings from Last 3 Encounters:  05/12/21 227 lb 9.6 oz (103.2 kg)  12/09/20 228 lb 6.4 oz (103.6 kg)  11/25/20 220 lb (99.8 kg)     GEN:  Well nourished, well developed in no acute distress HEENT: Normal NECK: No JVD; No carotid bruits CARDIAC: RRR, no murmurs, rubs, gallops RESPIRATORY:  Clear to auscultation without rales, wheezing or rhonchi  ABDOMEN: Soft, non-tender, non-distended MUSCULOSKELETAL:  No edema; No deformity  SKIN: Warm and dry NEUROLOGIC:  Alert and oriented x 3 PSYCHIATRIC:  Normal affect   ASSESSMENT:    1. Paroxysmal atrial fibrillation (HCC)   2. Coronary artery disease involving native coronary artery of native heart without angina pectoris   3. Aortic dilatation (HCC)   4. Essential hypertension   5. Hyperlipidemia, unspecified hyperlipidemia type   6. PAD (peripheral artery disease) (HCC)    PLAN:    Paroxysmal atrial fibrillation/flutter: CHA2DS2-VASc score 3 (hypertension, age, PAD).  Echocardiogram 09/08/2020 showed normal biventricular function, no significant valvular disease.  Underwent ablation with Dr. Quentin Ore on 11/25/2020.  Appears to be maintaining sinus rhythm. -Continue Eliquis 5 mg twice daily -Continue diltiazem 180 mg daily  CAD: Catheterization in [place] last year  showed 60 to 70% D1 stenosis.  Managing medically.  Currently denies any chest pain -Continue Eliquis and statin.  LDL at goal less than 70.  Aortic dilatation: Ascending aorta measures 40 mm on CTA 09/20/2020.  Will follow up with MRA in 1 year.  Hypertension: On lisinopril-hydrochlorothiazide 20-12.5 mg daily and diltiazem 180 mg daily.  Appears controlled.  PAD: Continue Eliquis and statin  Hyperlipidemia: On atorvastatin 10 mg daily, at goal LDL less than 70, LDL 64 on 08/03/20  DVT: History of recurrent venous thromboembolism.  Continue Eliquis  OSA: Continue CPAP.  RTC in 4 months.  Medication Adjustments/Labs and Tests Ordered: Current medicines are reviewed at length with the patient today.  Concerns regarding medicines are outlined above.  Orders Placed This Encounter  Procedures   EKG 12-Lead   No orders of the defined types were placed in this encounter.   Patient Instructions  Medication Instructions:  Your physician recommends that you continue on your current medications as directed. Please refer to the Current Medication list given to you today.  *If you need a refill on your cardiac medications before your next appointment, please call your pharmacy*  Testing/Procedures: MRA chest in October 2022  Follow-Up: At [facility], you and your health needs are our priority.  As part of our continuing mission to provide you with exceptional heart care, we have created designated Provider Care Teams.  These Care Teams include your primary Cardiologist (physician) and Advanced Practice Providers (APPs -  Physician Assistants and Nurse Practitioners) who all work together to provide you with the care you need, when you need it.  We recommend signing up for the patient portal called "MyChart".  Sign up information is provided on this After Visit Summary.  MyChart is used to connect with patients for Virtual Visits (Telemedicine).  Patients are able to view lab/test  results, encounter notes, upcoming appointments, etc.  Non-urgent messages can be sent to your provider as well.   To learn more about what you can do with MyChart, go to [email or web address].    Your next appointment:   End of October with Dr. Bruna Potter Lyons as a scribe for Donato Heinz, MD.,have documented  all relevant documentation on the behalf of Donato Heinz, MD,as directed by  Donato Heinz, MD while in the presence of Donato Heinz, MD.  I, Donato Heinz, MD, have reviewed all documentation for this visit. The documentation on 05/12/21 for the exam, diagnosis, procedures, and orders are all accurate and complete.   Signed, Donato Heinz, MD  05/12/2021 8:30 AM    [facility]

## 2021-05-12 ENCOUNTER — Encounter: Payer: Self-pay | Admitting: Cardiology

## 2021-05-12 ENCOUNTER — Ambulatory Visit (INDEPENDENT_AMBULATORY_CARE_PROVIDER_SITE_OTHER): Payer: Medicare Other | Admitting: Cardiology

## 2021-05-12 ENCOUNTER — Other Ambulatory Visit: Payer: Self-pay

## 2021-05-12 VITALS — BP 108/62 | HR 63 | Ht 72.0 in | Wt 227.6 lb

## 2021-05-12 DIAGNOSIS — I77819 Aortic ectasia, unspecified site: Secondary | ICD-10-CM | POA: Diagnosis not present

## 2021-05-12 DIAGNOSIS — I2510 Atherosclerotic heart disease of native coronary artery without angina pectoris: Secondary | ICD-10-CM | POA: Diagnosis not present

## 2021-05-12 DIAGNOSIS — E785 Hyperlipidemia, unspecified: Secondary | ICD-10-CM

## 2021-05-12 DIAGNOSIS — I480 Paroxysmal atrial fibrillation: Secondary | ICD-10-CM

## 2021-05-12 DIAGNOSIS — I10 Essential (primary) hypertension: Secondary | ICD-10-CM | POA: Diagnosis not present

## 2021-05-12 DIAGNOSIS — I739 Peripheral vascular disease, unspecified: Secondary | ICD-10-CM | POA: Diagnosis not present

## 2021-05-12 NOTE — Patient Instructions (Signed)
Medication Instructions:  Your physician recommends that you continue on your current medications as directed. Please refer to the Current Medication list given to you today.  *If you need a refill on your cardiac medications before your next appointment, please call your pharmacy*  Testing/Procedures: MRA chest in October 2022  Follow-Up: At [facility], you and your health needs are our priority.  As part of our continuing mission to provide you with exceptional heart care, we have created designated Provider Care Teams.  These Care Teams include your primary Cardiologist (physician) and Advanced Practice Providers (APPs -  Physician Assistants and Nurse Practitioners) who all work together to provide you with the care you need, when you need it.  We recommend signing up for the patient portal called "MyChart".  Sign up information is provided on this After Visit Summary.  MyChart is used to connect with patients for Virtual Visits (Telemedicine).  Patients are able to view lab/test results, encounter notes, upcoming appointments, etc.  Non-urgent messages can be sent to your provider as well.   To learn more about what you can do with MyChart, go to [email or web address].    Your next appointment:   End of October with Dr. Gardiner Rhyme

## 2021-05-18 DIAGNOSIS — M25552 Pain in left hip: Secondary | ICD-10-CM | POA: Diagnosis not present

## 2021-05-18 DIAGNOSIS — R269 Unspecified abnormalities of gait and mobility: Secondary | ICD-10-CM | POA: Diagnosis not present

## 2021-05-18 DIAGNOSIS — R1031 Right lower quadrant pain: Secondary | ICD-10-CM | POA: Diagnosis not present

## 2021-05-18 DIAGNOSIS — M25551 Pain in right hip: Secondary | ICD-10-CM | POA: Diagnosis not present

## 2021-05-25 DIAGNOSIS — M25551 Pain in right hip: Secondary | ICD-10-CM | POA: Diagnosis not present

## 2021-05-25 DIAGNOSIS — R1031 Right lower quadrant pain: Secondary | ICD-10-CM | POA: Diagnosis not present

## 2021-05-25 DIAGNOSIS — M25552 Pain in left hip: Secondary | ICD-10-CM | POA: Diagnosis not present

## 2021-05-25 DIAGNOSIS — R269 Unspecified abnormalities of gait and mobility: Secondary | ICD-10-CM | POA: Diagnosis not present

## 2021-05-27 DIAGNOSIS — R269 Unspecified abnormalities of gait and mobility: Secondary | ICD-10-CM | POA: Diagnosis not present

## 2021-05-27 DIAGNOSIS — M25552 Pain in left hip: Secondary | ICD-10-CM | POA: Diagnosis not present

## 2021-05-27 DIAGNOSIS — R1031 Right lower quadrant pain: Secondary | ICD-10-CM | POA: Diagnosis not present

## 2021-05-27 DIAGNOSIS — M25551 Pain in right hip: Secondary | ICD-10-CM | POA: Diagnosis not present

## 2021-05-30 DIAGNOSIS — D225 Melanocytic nevi of trunk: Secondary | ICD-10-CM | POA: Diagnosis not present

## 2021-05-30 DIAGNOSIS — L570 Actinic keratosis: Secondary | ICD-10-CM | POA: Diagnosis not present

## 2021-05-30 DIAGNOSIS — L814 Other melanin hyperpigmentation: Secondary | ICD-10-CM | POA: Diagnosis not present

## 2021-05-30 DIAGNOSIS — D2261 Melanocytic nevi of right upper limb, including shoulder: Secondary | ICD-10-CM | POA: Diagnosis not present

## 2021-05-30 DIAGNOSIS — D2262 Melanocytic nevi of left upper limb, including shoulder: Secondary | ICD-10-CM | POA: Diagnosis not present

## 2021-05-30 DIAGNOSIS — L821 Other seborrheic keratosis: Secondary | ICD-10-CM | POA: Diagnosis not present

## 2021-05-30 DIAGNOSIS — Z85828 Personal history of other malignant neoplasm of skin: Secondary | ICD-10-CM | POA: Diagnosis not present

## 2021-05-30 DIAGNOSIS — D2272 Melanocytic nevi of left lower limb, including hip: Secondary | ICD-10-CM | POA: Diagnosis not present

## 2021-05-30 DIAGNOSIS — D485 Neoplasm of uncertain behavior of skin: Secondary | ICD-10-CM | POA: Diagnosis not present

## 2021-05-30 DIAGNOSIS — D1801 Hemangioma of skin and subcutaneous tissue: Secondary | ICD-10-CM | POA: Diagnosis not present

## 2021-05-31 DIAGNOSIS — R1031 Right lower quadrant pain: Secondary | ICD-10-CM | POA: Diagnosis not present

## 2021-05-31 DIAGNOSIS — M25551 Pain in right hip: Secondary | ICD-10-CM | POA: Diagnosis not present

## 2021-05-31 DIAGNOSIS — M25552 Pain in left hip: Secondary | ICD-10-CM | POA: Diagnosis not present

## 2021-05-31 DIAGNOSIS — R269 Unspecified abnormalities of gait and mobility: Secondary | ICD-10-CM | POA: Diagnosis not present

## 2021-06-02 DIAGNOSIS — M25551 Pain in right hip: Secondary | ICD-10-CM | POA: Diagnosis not present

## 2021-06-02 DIAGNOSIS — R1031 Right lower quadrant pain: Secondary | ICD-10-CM | POA: Diagnosis not present

## 2021-06-02 DIAGNOSIS — R269 Unspecified abnormalities of gait and mobility: Secondary | ICD-10-CM | POA: Diagnosis not present

## 2021-06-02 DIAGNOSIS — M25552 Pain in left hip: Secondary | ICD-10-CM | POA: Diagnosis not present

## 2021-06-16 DIAGNOSIS — R269 Unspecified abnormalities of gait and mobility: Secondary | ICD-10-CM | POA: Diagnosis not present

## 2021-06-16 DIAGNOSIS — M25551 Pain in right hip: Secondary | ICD-10-CM | POA: Diagnosis not present

## 2021-06-16 DIAGNOSIS — R1031 Right lower quadrant pain: Secondary | ICD-10-CM | POA: Diagnosis not present

## 2021-06-16 DIAGNOSIS — M25552 Pain in left hip: Secondary | ICD-10-CM | POA: Diagnosis not present

## 2021-06-23 DIAGNOSIS — N281 Cyst of kidney, acquired: Secondary | ICD-10-CM | POA: Diagnosis not present

## 2021-06-23 DIAGNOSIS — C61 Malignant neoplasm of prostate: Secondary | ICD-10-CM | POA: Diagnosis not present

## 2021-06-23 DIAGNOSIS — R1031 Right lower quadrant pain: Secondary | ICD-10-CM | POA: Diagnosis not present

## 2021-06-23 DIAGNOSIS — R269 Unspecified abnormalities of gait and mobility: Secondary | ICD-10-CM | POA: Diagnosis not present

## 2021-06-23 DIAGNOSIS — M25551 Pain in right hip: Secondary | ICD-10-CM | POA: Diagnosis not present

## 2021-06-23 DIAGNOSIS — M25552 Pain in left hip: Secondary | ICD-10-CM | POA: Diagnosis not present

## 2021-06-23 DIAGNOSIS — N5082 Scrotal pain: Secondary | ICD-10-CM | POA: Diagnosis not present

## 2021-06-28 DIAGNOSIS — L905 Scar conditions and fibrosis of skin: Secondary | ICD-10-CM | POA: Diagnosis not present

## 2021-06-28 DIAGNOSIS — D225 Melanocytic nevi of trunk: Secondary | ICD-10-CM | POA: Diagnosis not present

## 2021-06-28 DIAGNOSIS — Z85828 Personal history of other malignant neoplasm of skin: Secondary | ICD-10-CM | POA: Diagnosis not present

## 2021-06-28 DIAGNOSIS — D485 Neoplasm of uncertain behavior of skin: Secondary | ICD-10-CM | POA: Diagnosis not present

## 2021-06-30 DIAGNOSIS — M25551 Pain in right hip: Secondary | ICD-10-CM | POA: Diagnosis not present

## 2021-06-30 DIAGNOSIS — M25552 Pain in left hip: Secondary | ICD-10-CM | POA: Diagnosis not present

## 2021-06-30 DIAGNOSIS — R269 Unspecified abnormalities of gait and mobility: Secondary | ICD-10-CM | POA: Diagnosis not present

## 2021-06-30 DIAGNOSIS — R1031 Right lower quadrant pain: Secondary | ICD-10-CM | POA: Diagnosis not present

## 2021-07-08 DIAGNOSIS — G8929 Other chronic pain: Secondary | ICD-10-CM | POA: Diagnosis not present

## 2021-07-08 DIAGNOSIS — Z8546 Personal history of malignant neoplasm of prostate: Secondary | ICD-10-CM | POA: Diagnosis not present

## 2021-07-08 DIAGNOSIS — I4891 Unspecified atrial fibrillation: Secondary | ICD-10-CM | POA: Diagnosis not present

## 2021-07-08 DIAGNOSIS — M810 Age-related osteoporosis without current pathological fracture: Secondary | ICD-10-CM | POA: Diagnosis not present

## 2021-07-08 DIAGNOSIS — I480 Paroxysmal atrial fibrillation: Secondary | ICD-10-CM | POA: Diagnosis not present

## 2021-07-08 DIAGNOSIS — I2510 Atherosclerotic heart disease of native coronary artery without angina pectoris: Secondary | ICD-10-CM | POA: Diagnosis not present

## 2021-07-08 DIAGNOSIS — Q613 Polycystic kidney, unspecified: Secondary | ICD-10-CM | POA: Diagnosis not present

## 2021-07-08 DIAGNOSIS — I10 Essential (primary) hypertension: Secondary | ICD-10-CM | POA: Diagnosis not present

## 2021-07-08 DIAGNOSIS — M1811 Unilateral primary osteoarthritis of first carpometacarpal joint, right hand: Secondary | ICD-10-CM | POA: Diagnosis not present

## 2021-07-19 ENCOUNTER — Other Ambulatory Visit (HOSPITAL_COMMUNITY): Payer: Self-pay

## 2021-07-19 MED ORDER — MULTAQ 400 MG PO TABS: 400.0000 mg | ORAL_TABLET | Freq: Two times a day (BID) | ORAL | 0 refills | Status: DC

## 2021-08-03 MED ORDER — DILTIAZEM HCL ER 180 MG PO CP24: 180.0000 mg | ORAL_CAPSULE | Freq: Every day | ORAL | 0 refills | Status: DC

## 2021-08-25 ENCOUNTER — Ambulatory Visit: Payer: Medicare Other | Admitting: Cardiology

## 2021-08-30 DIAGNOSIS — Z23 Encounter for immunization: Secondary | ICD-10-CM | POA: Diagnosis not present

## 2021-09-12 ENCOUNTER — Ambulatory Visit (HOSPITAL_COMMUNITY)
Admission: RE | Admit: 2021-09-12 | Discharge: 2021-09-12 | Disposition: A | Discharge status: Home / Self Care | Payer: Medicare Other | Source: Ambulatory Visit | Attending: Cardiology | Admitting: Cardiology

## 2021-09-12 ENCOUNTER — Telehealth: Payer: Self-pay | Admitting: Cardiology

## 2021-09-12 ENCOUNTER — Other Ambulatory Visit: Payer: Self-pay

## 2021-09-12 DIAGNOSIS — Z85828 Personal history of other malignant neoplasm of skin: Secondary | ICD-10-CM | POA: Diagnosis not present

## 2021-09-12 DIAGNOSIS — I77819 Aortic ectasia, unspecified site: Secondary | ICD-10-CM

## 2021-09-12 DIAGNOSIS — I712 Thoracic aortic aneurysm, without rupture, unspecified: Secondary | ICD-10-CM | POA: Diagnosis not present

## 2021-09-12 DIAGNOSIS — L738 Other specified follicular disorders: Secondary | ICD-10-CM | POA: Diagnosis not present

## 2021-09-12 DIAGNOSIS — L821 Other seborrheic keratosis: Secondary | ICD-10-CM | POA: Diagnosis not present

## 2021-09-12 DIAGNOSIS — D225 Melanocytic nevi of trunk: Secondary | ICD-10-CM | POA: Diagnosis not present

## 2021-09-12 DIAGNOSIS — D485 Neoplasm of uncertain behavior of skin: Secondary | ICD-10-CM | POA: Diagnosis not present

## 2021-09-12 DIAGNOSIS — C44622 Squamous cell carcinoma of skin of right upper limb, including shoulder: Secondary | ICD-10-CM | POA: Diagnosis not present

## 2021-09-12 DIAGNOSIS — L814 Other melanin hyperpigmentation: Secondary | ICD-10-CM | POA: Diagnosis not present

## 2021-09-12 MED ORDER — GADOBUTROL 1 MMOL/ML IV SOLN
10.0000 mL | Freq: Once | INTRAVENOUS | Status: AC | PRN
  Administered 2021-09-12: 10 mL via INTRAVENOUS

## 2021-09-12 NOTE — Telephone Encounter (Signed)
Rubi from [facility] was calling with a call report for this patient.

## 2021-09-12 NOTE — Telephone Encounter (Signed)
Spoke with Rubi from [facility]- Radiologist would like to draw attention to MRA chest done today with multiple findings per impression. Will forward to provider.

## 2021-09-13 ENCOUNTER — Other Ambulatory Visit: Payer: Self-pay | Admitting: *Deleted

## 2021-09-13 DIAGNOSIS — Z8546 Personal history of malignant neoplasm of prostate: Secondary | ICD-10-CM | POA: Diagnosis not present

## 2021-09-13 NOTE — Telephone Encounter (Signed)
Patient calling for MRI results. He states he read them on mychart and is very concerned.

## 2021-09-13 NOTE — Telephone Encounter (Signed)
See result note.  

## 2021-09-14 LAB — PSA: Prostate Specific Ag, Serum: <0.1 ng/mL (ref 0.0–4.0)

## 2021-09-15 NOTE — Progress Notes (Signed)
Electrophysiology Office Follow up Visit Note:    Date:  09/16/2021   ID:  [name], DOB May 05, 1951, MRN [id number]  PCP:  Lavone Orn, MD  [facility] Cardiologist:  None  [facility] Electrophysiologist:  Vickie Epley, MD    Interval History:    [name] is a 70 y.o. male who presents for a follow up visit. They were last seen in clinic February 25, 2021 during a virtual appointment.  He had a prior ablation on November 25, 2020.  At that appointment we stopped his Multaq.  He is continued on Eliquis for stroke prophylaxis.  He last saw Dr. Nechama Guard on May 12, 2021.  He is doing well.  No recurrence of atrial fibrillation.  He continues to take his Eliquis for stroke prophylaxis.  He is now off of his Multaq.  He did have an MRI recently which showed some enhancement of the ribs concerning for malignancy.  He has a follow-up with his primary care physician today to investigate this further.     Past Medical History:  Diagnosis Date   Arthritis    arthritis -back   Atrial fibrillation ([place])    a. diagnosed 09/2016 - started on Cardizem CD and Flecainide. Continue Coumadin for anticoagulation.   Cancer [facility])    cancer Prostate- surgery only   Chronic anticoagulation 09/10/2016   History of DVT (deep vein thrombosis)    History of DVT of lower extremity    left leg has some residual crculation issues   History of prostate cancer    Hyperlipidemia 09/09/2018   Hypertension    Hypertensive heart disease without CHF 09/29/2016   Obesity (BMI 30-39.9)    Paroxysmal atrial fibrillation (HCC)    CHA2DS2VASC score 2   Sleep apnea    a. uses CPAP   Sleep apnea in adult     Past Surgical History:  Procedure Laterality Date   ATRIAL FIBRILLATION ABLATION N/A 11/25/2020   Procedure: ATRIAL FIBRILLATION ABLATION;  Surgeon: Vickie Epley, MD;  Location: [facility];  Service: Cardiovascular;  Laterality: N/A;   BALLOON DILATION N/A 10/12/2015    Procedure: BALLOON DILATION;  Surgeon: Garlan Fair, MD;  [facility];  Service: Endoscopy;  Laterality: N/A;   COLONOSCOPY W/ POLYPECTOMY     x2 colon polyps found   COLONOSCOPY WITH PROPOFOL N/A 10/12/2015   Procedure: COLONOSCOPY WITH PROPOFOL;  Surgeon: Garlan Fair, MD;  [facility];  Service: Endoscopy;  Laterality: N/A;   ESOPHAGOGASTRODUODENOSCOPY (EGD) WITH PROPOFOL N/A 10/12/2015   Procedure: ESOPHAGOGASTRODUODENOSCOPY (EGD) WITH PROPOFOL;  Surgeon: Garlan Fair, MD;  [facility];  Service: Endoscopy;  Laterality: N/A;   SHOULDER ARTHROTOMY     x2 procedures- 1 open, 1 scope.   TONSILLECTOMY     TRANSURETHRAL RESECTION OF PROSTATE      Current Medications: Current Meds  Medication Sig   apixaban (ELIQUIS) 5 MG TABS tablet Take 1 tablet (5 mg total) by mouth 2 (two) times daily.   atorvastatin (LIPITOR) 10 MG tablet Take 10 mg by mouth at bedtime.   Calcium Carbonate-Vitamin D 600-200 MG-UNIT CAPS Take 1 tablet by mouth daily with breakfast.   Cholecalciferol (VITAMIN D) 50 MCG (2000 UT) tablet Take 2,000 Units by mouth daily.   Coenzyme Q10 (COQ-10) 200 MG CAPS Take 200 mg by mouth at bedtime.   diltiazem (DILT-XR) 180 MG 24 hr capsule Take 1 capsule (180 mg total) by mouth daily.   FOLIC ACID PO  Take 1,000 mcg by mouth daily.   lisinopril-hydrochlorothiazide (ZESTORETIC) 20-25 MG tablet Take 1 tablet by mouth daily.   MAGNESIUM CITRATE PO Take 1,000 mg by mouth in the morning.   potassium chloride SA (K-DUR,KLOR-CON) 20 MEQ tablet Take 20 mEq by mouth at bedtime.   Testosterone 20.25 MG/ACT (1.62%) GEL Apply 3 Pump topically daily.   vitamin B-12 (CYANOCOBALAMIN) 1000 MCG tablet Take 1,000 mcg by mouth daily.   [DISCONTINUED] dronedarone (MULTAQ) 400 MG tablet Take 1 tablet (400 mg total) by mouth 2 (two) times daily with a meal.   [DISCONTINUED] polyethylene glycol (MIRALAX / GLYCOLAX) 17 g packet Take 17 g by mouth daily as needed  for mild constipation or moderate constipation.     Allergies:   Scallops [shellfish allergy], Lactose, and Zonisamide   Social History   Socioeconomic History   Marital status: Married    Spouse name: Not on file   Number of children: Not on file   Years of education: Not on file   Highest education level: Not on file  Occupational History   Not on file  Tobacco Use   Smoking status: Former    Years: 15.00    Types: Cigarettes    Quit date: 12/04/1998    Years since quitting: 22.8   Smokeless tobacco: Never  Substance and Sexual Activity   Alcohol use: No   Drug use: No   Sexual activity: Not on file  Other Topics Concern   Not on file  Social History Narrative   Not on file   Social Determinants of Health   Financial Resource Strain: Not on file  Food Insecurity: Not on file  Transportation Needs: Not on file  Physical Activity: Not on file  Stress: Not on file  Social Connections: Not on file     Family History: The patient's family history includes Hypertension in his brother and sister; Prostate cancer in his brother.  ROS:   Please see the history of present illness.    All other systems reviewed and are negative.  EKGs/Labs/Other Studies Reviewed:    The following studies were reviewed today:    EKG:  The ekg ordered today demonstrates sinus rhythm.  Intervals are normal.  Recent Labs: 11/23/2020: BUN 20; Creatinine, Ser 1.02; Hemoglobin 14.9; Platelets 194; Potassium 4.6; Sodium 137  Recent Lipid Panel    Component Value Date/Time   CHOL 169 09/09/2016 0421   TRIG 77 09/09/2016 0421   HDL 49 09/09/2016 0421   CHOLHDL 3.4 09/09/2016 0421   VLDL 15 09/09/2016 0421   LDLCALC 105 (H) 09/09/2016 0421    Physical Exam:    VS:  BP 118/68   Pulse (!) 58   Ht 6' (1.829 m)   Wt 229 lb (103.9 kg)   BMI 31.06 kg/m     Wt Readings from Last 3 Encounters:  09/16/21 229 lb (103.9 kg)  05/12/21 227 lb 9.6 oz (103.2 kg)  12/09/20 228 lb 6.4 oz  (103.6 kg)     GEN:  Well nourished, well developed in no acute distress HEENT: Normal NECK: No JVD; No carotid bruits LYMPHATICS: No lymphadenopathy CARDIAC: RRR, no murmurs, rubs, gallops RESPIRATORY:  Clear to auscultation without rales, wheezing or rhonchi  ABDOMEN: Soft, non-tender, non-distended MUSCULOSKELETAL:  No edema; No deformity  SKIN: Warm and dry NEUROLOGIC:  Alert and oriented x 3 PSYCHIATRIC:  Normal affect        ASSESSMENT:    1. Persistent atrial fibrillation ([place])   2. Essential  hypertension    PLAN:    In order of problems listed above:   #Persistent atrial fibrillation Doing well after his ablation.  He is maintaining normal rhythm.  He is off of his Multaq.  I would recommend he continue taking his Eliquis for stroke prophylaxis.  We will plan on seeing him back in 1 year or sooner as needed.  #Hypertension Controlled today.  Continue current regimen.  I would recommend he checks his blood pressure 1-2 times per week at home.  He has primary care follow-up later today.   Follow-up 1 year or sooner as needed.    Medication Adjustments/Labs and Tests Ordered: Current medicines are reviewed at length with the patient today.  Concerns regarding medicines are outlined above.  No orders of the defined types were placed in this encounter.  No orders of the defined types were placed in this encounter.    Signed, Lars Mage, MD, [facility], [facility] 09/16/2021 8:30 AM    Electrophysiology [facility]

## 2021-09-16 ENCOUNTER — Other Ambulatory Visit: Payer: Self-pay | Admitting: Internal Medicine

## 2021-09-16 ENCOUNTER — Other Ambulatory Visit (HOSPITAL_COMMUNITY): Payer: Self-pay | Admitting: Internal Medicine

## 2021-09-16 ENCOUNTER — Encounter: Payer: Self-pay | Admitting: Cardiology

## 2021-09-16 ENCOUNTER — Other Ambulatory Visit: Payer: Self-pay

## 2021-09-16 ENCOUNTER — Ambulatory Visit (INDEPENDENT_AMBULATORY_CARE_PROVIDER_SITE_OTHER): Payer: Medicare Other | Admitting: Cardiology

## 2021-09-16 VITALS — BP 118/68 | HR 58 | Ht 72.0 in | Wt 229.0 lb

## 2021-09-16 DIAGNOSIS — I10 Essential (primary) hypertension: Secondary | ICD-10-CM

## 2021-09-16 DIAGNOSIS — R9389 Abnormal findings on diagnostic imaging of other specified body structures: Secondary | ICD-10-CM | POA: Diagnosis not present

## 2021-09-16 DIAGNOSIS — I4819 Other persistent atrial fibrillation: Secondary | ICD-10-CM | POA: Diagnosis not present

## 2021-09-16 DIAGNOSIS — Z23 Encounter for immunization: Secondary | ICD-10-CM | POA: Diagnosis not present

## 2021-09-16 DIAGNOSIS — Z711 Person with feared health complaint in whom no diagnosis is made: Secondary | ICD-10-CM | POA: Diagnosis not present

## 2021-09-16 DIAGNOSIS — Z8546 Personal history of malignant neoplasm of prostate: Secondary | ICD-10-CM | POA: Diagnosis not present

## 2021-09-16 NOTE — Patient Instructions (Addendum)
Medication Instructions:  °Your physician recommends that you continue on your current medications as directed. Please refer to the Current Medication list given to you today. °*If you need a refill on your cardiac medications before your next appointment, please call your pharmacy* ° °Lab Work: °None ordered. °If you have labs (blood work) drawn today and your tests are completely normal, you will receive your results only by: °MyChart Message (if you have MyChart) OR °A paper copy in the mail °If you have any lab test that is abnormal or we need to change your treatment, we will call you to review the results. ° °Testing/Procedures: °None ordered. ° °Follow-Up: °At [facility], you and your health needs are our priority.  As part of our continuing mission to provide you with exceptional heart care, we have created designated Provider Care Teams.  These Care Teams include your primary Cardiologist (physician) and Advanced Practice Providers (APPs -  Physician Assistants and Nurse Practitioners) who all work together to provide you with the care you need, when you need it. ° °Your next appointment:   °Your physician wants you to follow-up in: one year with one of the following Advanced Practice Providers on your designated Care Team:   °Renee Ursuy, PA-C °Michael "Andy" Tillery, PA-C °You will receive a reminder letter in the mail two months in advance. If you don't receive a letter, please call our office to schedule the follow-up appointment. ° °

## 2021-09-19 ENCOUNTER — Ambulatory Visit: Payer: Medicare Other | Admitting: Cardiology

## 2021-09-20 DIAGNOSIS — M1811 Unilateral primary osteoarthritis of first carpometacarpal joint, right hand: Secondary | ICD-10-CM | POA: Diagnosis not present

## 2021-09-20 DIAGNOSIS — Z8546 Personal history of malignant neoplasm of prostate: Secondary | ICD-10-CM | POA: Diagnosis not present

## 2021-09-20 DIAGNOSIS — I4891 Unspecified atrial fibrillation: Secondary | ICD-10-CM | POA: Diagnosis not present

## 2021-09-20 DIAGNOSIS — M810 Age-related osteoporosis without current pathological fracture: Secondary | ICD-10-CM | POA: Diagnosis not present

## 2021-09-20 DIAGNOSIS — I480 Paroxysmal atrial fibrillation: Secondary | ICD-10-CM | POA: Diagnosis not present

## 2021-09-20 DIAGNOSIS — Q613 Polycystic kidney, unspecified: Secondary | ICD-10-CM | POA: Diagnosis not present

## 2021-09-20 DIAGNOSIS — G8929 Other chronic pain: Secondary | ICD-10-CM | POA: Diagnosis not present

## 2021-09-20 DIAGNOSIS — I2510 Atherosclerotic heart disease of native coronary artery without angina pectoris: Secondary | ICD-10-CM | POA: Diagnosis not present

## 2021-09-20 DIAGNOSIS — I10 Essential (primary) hypertension: Secondary | ICD-10-CM | POA: Diagnosis not present

## 2021-09-22 ENCOUNTER — Other Ambulatory Visit: Payer: Self-pay

## 2021-09-22 ENCOUNTER — Encounter (HOSPITAL_COMMUNITY)
Admission: RE | Admit: 2021-09-22 | Discharge: 2021-09-22 | Disposition: A | Discharge status: Home / Self Care | Payer: Medicare Other | Source: Ambulatory Visit | Attending: Internal Medicine | Admitting: Internal Medicine

## 2021-09-22 DIAGNOSIS — M545 Low back pain, unspecified: Secondary | ICD-10-CM | POA: Diagnosis not present

## 2021-09-22 DIAGNOSIS — R9389 Abnormal findings on diagnostic imaging of other specified body structures: Secondary | ICD-10-CM | POA: Diagnosis not present

## 2021-09-22 DIAGNOSIS — C799 Secondary malignant neoplasm of unspecified site: Secondary | ICD-10-CM | POA: Diagnosis not present

## 2021-09-22 DIAGNOSIS — Z8546 Personal history of malignant neoplasm of prostate: Secondary | ICD-10-CM | POA: Diagnosis not present

## 2021-09-22 DIAGNOSIS — M160 Bilateral primary osteoarthritis of hip: Secondary | ICD-10-CM | POA: Diagnosis not present

## 2021-09-22 MED ORDER — TECHNETIUM TC 99M MEDRONATE IV KIT
20.0000 | PACK | Freq: Once | INTRAVENOUS | Status: AC | PRN
  Administered 2021-09-22: 21.8 via INTRAVENOUS

## 2021-09-29 NOTE — Progress Notes (Signed)
Cardiology Office Note:    Date:  09/30/2021   ID:  [name], DOB 1951/03/11, MRN [id number]  PCP:  Manuel Orn, MD  Cardiologist:  None  Electrophysiologist:  Manuel Epley, MD   Referring MD: Manuel Orn, MD   Chief Complaint  Patient presents with   Follow-up     History of Present Illness:    [name] is a 70 y.o. male with a hx of paroxysmal atrial fibrillation, hypertension, OSA, DVT, prostate cancer, PAD, hyperlipidemia who presents for follow-up.  He has had issues with recurrent venous thromboembolism, currently on Eliquis.  He was admitted to [facility] in October 2017 with atrial fibrillation.  He converted to sinus rhythm during admission and was started on flecainide and diltiazem.  Echocardiogram at that time showed normal LV function, moderate to severe left atrial dilatation, no significant valvular disease.  He spends 6 months/year in [place] and reports that in 2020 established with a cardiologist in [place].  Stress test was done, though patient denied having any symptoms.  Stress test was abnormal and underwent cardiac catheterization which showed 60 to 70% stenosis of D1.  Medical management was recommended.  Reports that in August 2021 he had his first recurrence of atrial fibrillation since 2017.  States that he went into AF for about 1 hour with heart rate in 150s.  Converted spontaneously.  Reports that he is active, walks 2.5 miles per day.  Denies any exertional symptoms.  Does report rare resting chest pain that he describes as left-sided sharp pain, but no exertional chest pain.  Denies any lightheadedness or syncope.  Reports that he smoked for 20 years, up to 2 packs/day but quit in 2000.  No known history of heart disease in his immediate family.  Echocardiogram 09/08/2020 showed normal biventricular function, mild AI, dilatation of the ascending aorta measuring 42 mm.  CTA chest on 09/20/2020 showed mild dilatation of the ascending aorta  measuring up to 40 mm.  MRA 09/12/2021 showed stable ascending aortic dilatation measuring 40 mm.  Underwent A. fib ablation with Dr. Quentin Richmond on 11/25/2020.  Since last clinic visit, he reports that he is doing well.  Denies any chest pain, dyspnea, lightheadedness, syncope, or palpitations.  Does report intermittent lower extremity edema.  BP 110s to 120s over 70s to 80s at home.  He walks 1.5 miles per day.  Denies any exertional symptoms.  He is taking his Eliquis, denies any bleeding issues.    Past Medical History:  Diagnosis Date   Arthritis    arthritis -back   Atrial fibrillation ([place])    a. diagnosed 09/2016 - started on Cardizem CD and Flecainide. Continue Coumadin for anticoagulation.   Cancer [facility])    cancer Prostate- surgery only   Chronic anticoagulation 09/10/2016   History of DVT (deep vein thrombosis)    History of DVT of lower extremity    left leg has some residual crculation issues   History of prostate cancer    Hyperlipidemia 09/09/2018   Hypertension    Hypertensive heart disease without CHF 09/29/2016   Obesity (BMI 30-39.9)    Paroxysmal atrial fibrillation (HCC)    CHA2DS2VASC score 2   Sleep apnea    a. uses CPAP   Sleep apnea in adult     Past Surgical History:  Procedure Laterality Date   ATRIAL FIBRILLATION ABLATION N/A 11/25/2020   Procedure: ATRIAL FIBRILLATION ABLATION;  Surgeon: Manuel Epley, MD;  Location: [facility] CV [facility];  Service: Cardiovascular;  Laterality:  N/A;   BALLOON DILATION N/A 10/12/2015   Procedure: BALLOON DILATION;  Surgeon: Garlan Fair, MD;  Location: [facility];  Service: Endoscopy;  Laterality: N/A;   COLONOSCOPY W/ POLYPECTOMY     x2 colon polyps found   COLONOSCOPY WITH PROPOFOL N/A 10/12/2015   Procedure: COLONOSCOPY WITH PROPOFOL;  Surgeon: Garlan Fair, MD;  Location: [facility] END[facility];  Service: Endoscopy;  Laterality: N/A;   ESOPHAGOGASTRODUODENOSCOPY (EGD) WITH PROPOFOL N/A 10/12/2015   Procedure:  ESOPHAGOGASTRODUODENOSCOPY (EGD) WITH PROPOFOL;  Surgeon: Garlan Fair, MD;  Location: [facility];  Service: Endoscopy;  Laterality: N/A;   SHOULDER ARTHROTOMY     x2 procedures- 1 open, 1 scope.   TONSILLECTOMY     TRANSURETHRAL RESECTION OF PROSTATE      Current Medications: Current Meds  Medication Sig   apixaban (ELIQUIS) 5 MG TABS tablet Take 1 tablet (5 mg total) by mouth 2 (two) times daily.   atorvastatin (LIPITOR) 10 MG tablet Take 10 mg by mouth at bedtime.   Calcium Carbonate-Vitamin D 600-200 MG-UNIT CAPS Take 1 tablet by mouth daily with breakfast.   Cholecalciferol (VITAMIN D) 50 MCG (2000 UT) tablet Take 2,000 Units by mouth daily.   Coenzyme Q10 (COQ-10) 200 MG CAPS Take 200 mg by mouth at bedtime.   FOLIC ACID PO Take 8,144 mcg by mouth daily.   lisinopril-hydrochlorothiazide (ZESTORETIC) 20-25 MG tablet Take 1 tablet by mouth daily.   MAGNESIUM CITRATE PO Take 1,000 mg by mouth in the morning.   potassium chloride SA (K-DUR,KLOR-CON) 20 MEQ tablet Take 20 mEq by mouth at bedtime.   Testosterone 20.25 MG/ACT (1.62%) GEL Apply 3 Pump topically daily.   vitamin B-12 (CYANOCOBALAMIN) 1000 MCG tablet Take 1,000 mcg by mouth daily.   [DISCONTINUED] diltiazem (DILT-XR) 180 MG 24 hr capsule Take 1 capsule (180 mg total) by mouth daily.     Allergies:   Scallops [shellfish allergy], Lactose, and Zonisamide   Social History   Socioeconomic History   Marital status: Married    Spouse name: Not on file   Number of children: Not on file   Years of education: Not on file   Highest education level: Not on file  Occupational History   Not on file  Tobacco Use   Smoking status: Former    Years: 15.00    Types: Cigarettes    Quit date: 12/04/1998    Years since quitting: 22.8   Smokeless tobacco: Never  Substance and Sexual Activity   Alcohol use: No   Drug use: No   Sexual activity: Not on file  Other Topics Concern   Not on file  Social History Narrative   Not  on file   Social Determinants of Health   Financial Resource Strain: Not on file  Food Insecurity: Not on file  Transportation Needs: Not on file  Physical Activity: Not on file  Stress: Not on file  Social Connections: Not on file     Family History: The patient's family history includes Hypertension in his brother and sister; Prostate cancer in his brother.  ROS:   Please see the history of present illness.     All other systems reviewed and are negative.  EKGs/Labs/Other Studies Reviewed:    The following studies were reviewed today:  US Venous Left LE 11/30/2020: 1. No evidence of acute DVT within the left lower extremity. 2. The examination is positive for nonocclusive wall thickening/chronic DVT involving the distal aspect of the left femoral vein and the left  popliteal vein, similar to remote examination performed in 2010. 3. Chronic occlusive superficial thrombophlebitis involving the proximal aspect of the left greater saphenous vein as well as the imaged portions of the left lesser saphenous vein.  EP Study/Afib Ablation 11/25/2020: CONCLUSIONS: 1. Successful PVI 2. Intracardiac echo reveals vertical heart, normal LV function, 4 PVs and trivial pericardial effusion. 3. No early apparent complications.  CT Angio Chest 09/20/2020: 1. Ascending thoracic aortic caliber 4.0 cm axial dimension. Mildly aneurysmal. Recommend annual imaging followup by CTA or MRA. This recommendation follows 2010 ACCF/AHA/AATS/ACR/ASA/SCA/SCAI/SIR/STS/SVM Guidelines for the Diagnosis and Management of Patients with Thoracic Aortic Disease. Circulation. 2010; 121: D983-J825. Aortic aneurysm NOS (ICD10-I71.9) 2. Coronary artery disease with calcification of the LEFT coronary circulation. 3. Renal and cystic hepatic lesions. Please see prior abdominal CT for further detail regarding follow-up. 4. Aortic atherosclerosis.   Aortic Atherosclerosis (ICD10-I70.0).  Echo 09/08/2020:  1.  Left ventricular ejection fraction, by estimation, is 60 to 65%. The  left ventricle has normal function. The left ventricle has no regional  wall motion abnormalities. There is mild concentric left ventricular  hypertrophy. Left ventricular diastolic  parameters were normal.   2. Right ventricular systolic function is normal. The right ventricular  size is normal. There is normal pulmonary artery systolic pressure. The  estimated right ventricular systolic pressure is 05.3 mmHg.   3. The mitral valve is grossly normal. Trivial mitral valve  regurgitation. No evidence of mitral stenosis.   4. The aortic valve is tricuspid. There is moderate calcification of the  aortic valve. There is moderate thickening of the aortic valve. Aortic  valve regurgitation is mild. Mild to moderate aortic valve  sclerosis/calcification is present, without any  evidence of aortic stenosis.   5. There is mild dilatation of the ascending aorta, measuring 42 mm.   6. The inferior vena cava is normal in size with greater than 50%  respiratory variability, suggesting right atrial pressure of 3 mmHg.   Comparison(s): Changes from prior study are noted. EF normal 60-65%.  Normal LA. Mild AI.   EKG:   05/12/2021: Sinus rhythm with frequent PACs, rate 63, no ST abnormalities 10/11/2020: EKG was not ordered.  08/25/2020: normal sinus rhythm, rate 60, no ST/T abnormalities.    Recent Labs: 11/23/2020: BUN 20; Creatinine, Ser 1.02; Hemoglobin 14.9; Platelets 194; Potassium 4.6; Sodium 137  Recent Lipid Panel    Component Value Date/Time   CHOL 169 09/09/2016 0421   TRIG 77 09/09/2016 0421   HDL 49 09/09/2016 0421   CHOLHDL 3.4 09/09/2016 0421   VLDL 15 09/09/2016 0421   LDLCALC 105 (H) 09/09/2016 0421    Physical Exam:    VS:  BP 110/68 (BP Location: Left Arm, Patient Position: Sitting, Cuff Size: Normal)   Pulse 77   Ht 6' (1.829 m)   Wt 227 lb (103 kg)   BMI 30.79 kg/m     Wt Readings from Last 3  Encounters:  09/30/21 227 lb (103 kg)  09/16/21 229 lb (103.9 kg)  05/12/21 227 lb 9.6 oz (103.2 kg)     GEN:  Well nourished, well developed in no acute distress HEENT: Normal NECK: No JVD; No carotid bruits CARDIAC: RRR, no murmurs, rubs, gallops RESPIRATORY:  Clear to auscultation without rales, wheezing or rhonchi  ABDOMEN: Soft, non-tender, non-distended MUSCULOSKELETAL:  No edema; No deformity  SKIN: Warm and dry NEUROLOGIC:  Alert and oriented x 3 PSYCHIATRIC:  Normal affect   ASSESSMENT:    1. Paroxysmal atrial fibrillation (HCC)  2. Coronary artery disease involving native coronary artery of native heart without angina pectoris   3. Aortic dilatation (HCC)   4. Essential hypertension   5. PAD (peripheral artery disease) ([place])   6. Hyperlipidemia, unspecified hyperlipidemia type     PLAN:    Paroxysmal atrial fibrillation/flutter: CHA2DS2-VASc score 3 (hypertension, age, PAD).  Echocardiogram 09/08/2020 showed normal biventricular function, no significant valvular disease.  Underwent ablation with Dr. Quentin Richmond on 11/25/2020.  Appears to be maintaining sinus rhythm. -Continue Eliquis 5 mg twice daily -Continue diltiazem 180 mg daily  CAD: Catheterization in [place] in 2021 showed 60 to 70% D1 stenosis.  Managing medically.  Currently denies any chest pain -Continue Eliquis and statin.  LDL at goal less than 70.  Aortic dilatation: Ascending aorta measures 40 mm on CTA 09/20/2020.  MRA 09/12/2021 showed stable ascending aortic dilatation measuring 40 mm  Hypertension: On lisinopril-hydrochlorothiazide 20-12.5 mg daily and diltiazem 180 mg daily.  Appears controlled.  Cr 1.0 on BMET 09/16/21  PAD: Continue Eliquis and statin  Hyperlipidemia: On atorvastatin 10 mg daily, at goal LDL less than 70, LDL 64 on 08/03/20  DVT: History of recurrent venous thromboembolism.  Continue Eliquis  OSA: Continue CPAP.  Bone lesion: MRI showed abnormal enhancement in ninth rib.   Given history of prostate cancer, could be concerning for osseous metastatic disease.  PSA undetectable.  Bone scan showed focal activity at bilateral ninth costovertebral junctions, felt to be degenerative or posttraumatic   RTC in 9 months  Medication Adjustments/Labs and Tests Ordered: Current medicines are reviewed at length with the patient today.  Concerns regarding medicines are outlined above.  No orders of the defined types were placed in this encounter.  Meds ordered this encounter  Medications   diltiazem (DILT-XR) 180 MG 24 hr capsule    Sig: Take 1 capsule (180 mg total) by mouth daily.    Dispense:  90 capsule    Refill:  3     Patient Instructions  Medication Instructions:  Your physician recommends that you continue on your current medications as directed. Please refer to the Current Medication list given to you today.  *If you need a refill on your cardiac medications before your next appointment, please call your pharmacy*  Follow-Up: At [facility], you and your health needs are our priority.  As part of our continuing mission to provide you with exceptional heart care, we have created designated Provider Care Teams.  These Care Teams include your primary Cardiologist (physician) and Advanced Practice Providers (APPs -  Physician Assistants and Nurse Practitioners) who all work together to provide you with the care you need, when you need it.  We recommend signing up for the patient portal called "MyChart".  Sign up information is provided on this After Visit Summary.  MyChart is used to connect with patients for Virtual Visits (Telemedicine).  Patients are able to view lab/test results, encounter notes, upcoming appointments, etc.  Non-urgent messages can be sent to your provider as well.   To learn more about what you can do with MyChart, go to [email or web address].    Your next appointment:   9 month(s)  The format for your next appointment:   In  Person  Provider:   Oswaldo Milian, MD        Signed, Donato Heinz, MD  09/30/2021 8:25 AM    [place]

## 2021-09-30 ENCOUNTER — Other Ambulatory Visit: Payer: Self-pay

## 2021-09-30 ENCOUNTER — Ambulatory Visit (INDEPENDENT_AMBULATORY_CARE_PROVIDER_SITE_OTHER): Payer: Medicare Other | Admitting: Cardiology

## 2021-09-30 ENCOUNTER — Encounter: Payer: Self-pay | Admitting: Cardiology

## 2021-09-30 VITALS — BP 110/68 | HR 77 | Ht 72.0 in | Wt 227.0 lb

## 2021-09-30 DIAGNOSIS — I2510 Atherosclerotic heart disease of native coronary artery without angina pectoris: Secondary | ICD-10-CM

## 2021-09-30 DIAGNOSIS — I480 Paroxysmal atrial fibrillation: Secondary | ICD-10-CM | POA: Diagnosis not present

## 2021-09-30 DIAGNOSIS — I77819 Aortic ectasia, unspecified site: Secondary | ICD-10-CM | POA: Diagnosis not present

## 2021-09-30 DIAGNOSIS — I700 Atherosclerosis of aorta: Secondary | ICD-10-CM | POA: Diagnosis not present

## 2021-09-30 DIAGNOSIS — D6869 Other thrombophilia: Secondary | ICD-10-CM | POA: Diagnosis not present

## 2021-09-30 DIAGNOSIS — E785 Hyperlipidemia, unspecified: Secondary | ICD-10-CM

## 2021-09-30 DIAGNOSIS — I739 Peripheral vascular disease, unspecified: Secondary | ICD-10-CM

## 2021-09-30 DIAGNOSIS — I10 Essential (primary) hypertension: Secondary | ICD-10-CM

## 2021-09-30 DIAGNOSIS — Z0000 Encounter for general adult medical examination without abnormal findings: Secondary | ICD-10-CM | POA: Diagnosis not present

## 2021-09-30 DIAGNOSIS — G4733 Obstructive sleep apnea (adult) (pediatric): Secondary | ICD-10-CM | POA: Diagnosis not present

## 2021-09-30 MED ORDER — DILTIAZEM HCL ER 180 MG PO CP24: 180.0000 mg | ORAL_CAPSULE | Freq: Every day | ORAL | 3 refills | Status: DC

## 2021-09-30 NOTE — Patient Instructions (Signed)
Medication Instructions:  Your physician recommends that you continue on your current medications as directed. Please refer to the Current Medication list given to you today.  *If you need a refill on your cardiac medications before your next appointment, please call your pharmacy*  Follow-Up: At [facility], you and your health needs are our priority.  As part of our continuing mission to provide you with exceptional heart care, we have created designated Provider Care Teams.  These Care Teams include your primary Cardiologist (physician) and Advanced Practice Providers (APPs -  Physician Assistants and Nurse Practitioners) who all work together to provide you with the care you need, when you need it.  We recommend signing up for the patient portal called "MyChart".  Sign up information is provided on this After Visit Summary.  MyChart is used to connect with patients for Virtual Visits (Telemedicine).  Patients are able to view lab/test results, encounter notes, upcoming appointments, etc.  Non-urgent messages can be sent to your provider as well.   To learn more about what you can do with MyChart, go to [email or web address].    Your next appointment:   9 month(s)  The format for your next appointment:   In Person  Provider:   Oswaldo Milian, MD

## 2021-10-22 DIAGNOSIS — E790 Hyperuricemia without signs of inflammatory arthritis and tophaceous disease: Secondary | ICD-10-CM | POA: Diagnosis not present

## 2021-10-22 DIAGNOSIS — E559 Vitamin D deficiency, unspecified: Secondary | ICD-10-CM | POA: Diagnosis not present

## 2021-10-22 DIAGNOSIS — E291 Testicular hypofunction: Secondary | ICD-10-CM | POA: Diagnosis not present

## 2021-10-22 DIAGNOSIS — N390 Urinary tract infection, site not specified: Secondary | ICD-10-CM | POA: Diagnosis not present

## 2021-10-22 DIAGNOSIS — E538 Deficiency of other specified B group vitamins: Secondary | ICD-10-CM | POA: Diagnosis not present

## 2021-10-22 DIAGNOSIS — I10 Essential (primary) hypertension: Secondary | ICD-10-CM | POA: Diagnosis not present

## 2021-10-22 DIAGNOSIS — E782 Mixed hyperlipidemia: Secondary | ICD-10-CM | POA: Diagnosis not present

## 2021-10-22 DIAGNOSIS — C61 Malignant neoplasm of prostate: Secondary | ICD-10-CM | POA: Diagnosis not present

## 2021-10-22 DIAGNOSIS — R5383 Other fatigue: Secondary | ICD-10-CM | POA: Diagnosis not present

## 2021-10-31 DIAGNOSIS — N390 Urinary tract infection, site not specified: Secondary | ICD-10-CM | POA: Diagnosis not present

## 2021-10-31 DIAGNOSIS — E782 Mixed hyperlipidemia: Secondary | ICD-10-CM | POA: Diagnosis not present

## 2021-10-31 DIAGNOSIS — E291 Testicular hypofunction: Secondary | ICD-10-CM | POA: Diagnosis not present

## 2021-10-31 DIAGNOSIS — E559 Vitamin D deficiency, unspecified: Secondary | ICD-10-CM | POA: Diagnosis not present

## 2021-10-31 DIAGNOSIS — E538 Deficiency of other specified B group vitamins: Secondary | ICD-10-CM | POA: Diagnosis not present

## 2021-10-31 DIAGNOSIS — R5383 Other fatigue: Secondary | ICD-10-CM | POA: Diagnosis not present

## 2021-10-31 DIAGNOSIS — I10 Essential (primary) hypertension: Secondary | ICD-10-CM | POA: Diagnosis not present

## 2022-01-03 DIAGNOSIS — I4891 Unspecified atrial fibrillation: Secondary | ICD-10-CM | POA: Diagnosis not present

## 2022-01-03 DIAGNOSIS — I480 Paroxysmal atrial fibrillation: Secondary | ICD-10-CM | POA: Diagnosis not present

## 2022-01-03 DIAGNOSIS — I10 Essential (primary) hypertension: Secondary | ICD-10-CM | POA: Diagnosis not present

## 2022-01-03 DIAGNOSIS — G8929 Other chronic pain: Secondary | ICD-10-CM | POA: Diagnosis not present

## 2022-02-01 DIAGNOSIS — H2513 Age-related nuclear cataract, bilateral: Secondary | ICD-10-CM | POA: Diagnosis not present

## 2022-02-14 DIAGNOSIS — Z20822 Contact with and (suspected) exposure to covid-19: Secondary | ICD-10-CM | POA: Diagnosis not present

## 2022-03-06 DIAGNOSIS — R5383 Other fatigue: Secondary | ICD-10-CM | POA: Diagnosis not present

## 2022-03-06 DIAGNOSIS — E538 Deficiency of other specified B group vitamins: Secondary | ICD-10-CM | POA: Diagnosis not present

## 2022-03-06 DIAGNOSIS — N390 Urinary tract infection, site not specified: Secondary | ICD-10-CM | POA: Diagnosis not present

## 2022-03-06 DIAGNOSIS — I10 Essential (primary) hypertension: Secondary | ICD-10-CM | POA: Diagnosis not present

## 2022-03-06 DIAGNOSIS — E559 Vitamin D deficiency, unspecified: Secondary | ICD-10-CM | POA: Diagnosis not present

## 2022-03-06 DIAGNOSIS — E782 Mixed hyperlipidemia: Secondary | ICD-10-CM | POA: Diagnosis not present

## 2022-03-15 DIAGNOSIS — I10 Essential (primary) hypertension: Secondary | ICD-10-CM | POA: Diagnosis not present

## 2022-03-15 DIAGNOSIS — N390 Urinary tract infection, site not specified: Secondary | ICD-10-CM | POA: Diagnosis not present

## 2022-03-15 DIAGNOSIS — E790 Hyperuricemia without signs of inflammatory arthritis and tophaceous disease: Secondary | ICD-10-CM | POA: Diagnosis not present

## 2022-03-15 DIAGNOSIS — R5383 Other fatigue: Secondary | ICD-10-CM | POA: Diagnosis not present

## 2022-03-15 DIAGNOSIS — E291 Testicular hypofunction: Secondary | ICD-10-CM | POA: Diagnosis not present

## 2022-03-15 DIAGNOSIS — M159 Polyosteoarthritis, unspecified: Secondary | ICD-10-CM | POA: Diagnosis not present

## 2022-03-15 DIAGNOSIS — E559 Vitamin D deficiency, unspecified: Secondary | ICD-10-CM | POA: Diagnosis not present

## 2022-03-15 DIAGNOSIS — E538 Deficiency of other specified B group vitamins: Secondary | ICD-10-CM | POA: Diagnosis not present

## 2022-03-15 DIAGNOSIS — C61 Malignant neoplasm of prostate: Secondary | ICD-10-CM | POA: Diagnosis not present

## 2022-03-15 DIAGNOSIS — E782 Mixed hyperlipidemia: Secondary | ICD-10-CM | POA: Diagnosis not present

## 2022-04-06 DIAGNOSIS — Z20822 Contact with and (suspected) exposure to covid-19: Secondary | ICD-10-CM | POA: Diagnosis not present

## 2022-04-26 DIAGNOSIS — Z23 Encounter for immunization: Secondary | ICD-10-CM | POA: Diagnosis not present

## 2022-05-04 DIAGNOSIS — Z85828 Personal history of other malignant neoplasm of skin: Secondary | ICD-10-CM | POA: Diagnosis not present

## 2022-05-04 DIAGNOSIS — L821 Other seborrheic keratosis: Secondary | ICD-10-CM | POA: Diagnosis not present

## 2022-05-04 DIAGNOSIS — L720 Epidermal cyst: Secondary | ICD-10-CM | POA: Diagnosis not present

## 2022-05-04 DIAGNOSIS — D225 Melanocytic nevi of trunk: Secondary | ICD-10-CM | POA: Diagnosis not present

## 2022-05-04 DIAGNOSIS — L814 Other melanin hyperpigmentation: Secondary | ICD-10-CM | POA: Diagnosis not present

## 2022-05-04 DIAGNOSIS — L738 Other specified follicular disorders: Secondary | ICD-10-CM | POA: Diagnosis not present

## 2022-05-04 DIAGNOSIS — C4442 Squamous cell carcinoma of skin of scalp and neck: Secondary | ICD-10-CM | POA: Diagnosis not present

## 2022-05-04 DIAGNOSIS — L570 Actinic keratosis: Secondary | ICD-10-CM | POA: Diagnosis not present

## 2022-05-30 ENCOUNTER — Ambulatory Visit (INDEPENDENT_AMBULATORY_CARE_PROVIDER_SITE_OTHER): Payer: Medicare Other | Admitting: Cardiology

## 2022-05-30 VITALS — BP 116/60 | HR 64 | Ht 72.0 in | Wt 230.0 lb

## 2022-05-30 DIAGNOSIS — I480 Paroxysmal atrial fibrillation: Secondary | ICD-10-CM

## 2022-05-30 DIAGNOSIS — I10 Essential (primary) hypertension: Secondary | ICD-10-CM

## 2022-05-30 DIAGNOSIS — I2510 Atherosclerotic heart disease of native coronary artery without angina pectoris: Secondary | ICD-10-CM | POA: Diagnosis not present

## 2022-05-30 DIAGNOSIS — E785 Hyperlipidemia, unspecified: Secondary | ICD-10-CM | POA: Diagnosis not present

## 2022-05-30 DIAGNOSIS — R7301 Impaired fasting glucose: Secondary | ICD-10-CM | POA: Diagnosis not present

## 2022-05-31 LAB — HEMOGLOBIN A1C
Est. average glucose Bld gHb Est-mCnc: 117 mg/dL
Hgb A1c MFr Bld: 5.7 % — ABNORMAL HIGH (ref 4.8–5.6)

## 2022-05-31 LAB — CBC
Hematocrit: 42.7 % (ref 37.5–51.0)
Hemoglobin: 14.9 g/dL (ref 13.0–17.7)
MCH: 31 pg (ref 26.6–33.0)
MCHC: 34.9 g/dL (ref 31.5–35.7)
MCV: 89 fL (ref 79–97)
Platelets: 186 10*3/uL (ref 150–450)
RBC: 4.8 x10E6/uL (ref 4.14–5.80)
RDW: 12.7 % (ref 11.6–15.4)
WBC: 6.5 10*3/uL (ref 3.4–10.8)

## 2022-05-31 LAB — LIPID PANEL
Chol/HDL Ratio: 2.1 ratio (ref 0.0–5.0)
Cholesterol, Total: 136 mg/dL (ref 100–199)
HDL: 65 mg/dL (ref >39)
LDL Chol Calc (NIH): 57 mg/dL (ref 0–99)
Triglycerides: 67 mg/dL (ref 0–149)
VLDL Cholesterol Cal: 14 mg/dL (ref 5–40)

## 2022-05-31 LAB — COMPREHENSIVE METABOLIC PANEL
ALT: 17 IU/L (ref 0–44)
AST: 14 IU/L (ref 0–40)
Albumin/Globulin Ratio: 2.2 (ref 1.2–2.2)
Albumin: 4.7 g/dL (ref 3.7–4.7)
Alkaline Phosphatase: 84 IU/L (ref 44–121)
BUN/Creatinine Ratio: 11 (ref 10–24)
BUN: 11 mg/dL (ref 8–27)
Bilirubin Total: 0.5 mg/dL (ref 0.0–1.2)
CO2: 22 mmol/L (ref 20–29)
Calcium: 9.5 mg/dL (ref 8.6–10.2)
Chloride: 99 mmol/L (ref 96–106)
Creatinine, Ser: 0.99 mg/dL (ref 0.76–1.27)
Globulin, Total: 2.1 g/dL (ref 1.5–4.5)
Glucose: 101 mg/dL — ABNORMAL HIGH (ref 70–99)
Potassium: 3.9 mmol/L (ref 3.5–5.2)
Sodium: 135 mmol/L (ref 134–144)
Total Protein: 6.8 g/dL (ref 6.0–8.5)
eGFR: 81 mL/min/{1.73_m2} (ref >59)

## 2022-06-02 DIAGNOSIS — G588 Other specified mononeuropathies: Secondary | ICD-10-CM | POA: Diagnosis not present

## 2022-06-02 DIAGNOSIS — R7303 Prediabetes: Secondary | ICD-10-CM | POA: Diagnosis not present

## 2022-06-15 DIAGNOSIS — K7689 Other specified diseases of liver: Secondary | ICD-10-CM | POA: Diagnosis not present

## 2022-06-15 DIAGNOSIS — K8020 Calculus of gallbladder without cholecystitis without obstruction: Secondary | ICD-10-CM | POA: Diagnosis not present

## 2022-06-15 DIAGNOSIS — N281 Cyst of kidney, acquired: Secondary | ICD-10-CM | POA: Diagnosis not present

## 2022-06-15 DIAGNOSIS — K8689 Other specified diseases of pancreas: Secondary | ICD-10-CM | POA: Diagnosis not present

## 2022-06-22 DIAGNOSIS — N281 Cyst of kidney, acquired: Secondary | ICD-10-CM | POA: Diagnosis not present

## 2022-06-22 DIAGNOSIS — C61 Malignant neoplasm of prostate: Secondary | ICD-10-CM | POA: Diagnosis not present

## 2022-06-22 DIAGNOSIS — K862 Cyst of pancreas: Secondary | ICD-10-CM | POA: Diagnosis not present

## 2022-07-19 DIAGNOSIS — M1711 Unilateral primary osteoarthritis, right knee: Secondary | ICD-10-CM | POA: Diagnosis not present

## 2022-07-19 DIAGNOSIS — M170 Bilateral primary osteoarthritis of knee: Secondary | ICD-10-CM | POA: Diagnosis not present

## 2022-07-19 DIAGNOSIS — M1712 Unilateral primary osteoarthritis, left knee: Secondary | ICD-10-CM | POA: Diagnosis not present

## 2022-07-19 DIAGNOSIS — M7062 Trochanteric bursitis, left hip: Secondary | ICD-10-CM | POA: Diagnosis not present

## 2022-07-19 DIAGNOSIS — M7061 Trochanteric bursitis, right hip: Secondary | ICD-10-CM | POA: Diagnosis not present

## 2022-08-16 DIAGNOSIS — M7062 Trochanteric bursitis, left hip: Secondary | ICD-10-CM | POA: Diagnosis not present

## 2022-08-16 DIAGNOSIS — M7061 Trochanteric bursitis, right hip: Secondary | ICD-10-CM | POA: Diagnosis not present

## 2022-08-18 ENCOUNTER — Other Ambulatory Visit: Payer: Self-pay | Admitting: Internal Medicine

## 2022-08-18 ENCOUNTER — Ambulatory Visit
Admission: RE | Admit: 2022-08-18 | Discharge: 2022-08-18 | Disposition: A | Discharge status: Home / Self Care | Payer: Medicare Other | Source: Ambulatory Visit | Attending: Internal Medicine | Admitting: Internal Medicine

## 2022-08-18 DIAGNOSIS — R079 Chest pain, unspecified: Secondary | ICD-10-CM | POA: Diagnosis not present

## 2022-08-18 DIAGNOSIS — R0789 Other chest pain: Secondary | ICD-10-CM

## 2022-08-18 DIAGNOSIS — M546 Pain in thoracic spine: Secondary | ICD-10-CM | POA: Diagnosis not present

## 2022-08-21 DIAGNOSIS — M545 Low back pain, unspecified: Secondary | ICD-10-CM | POA: Diagnosis not present

## 2022-08-25 DIAGNOSIS — M1712 Unilateral primary osteoarthritis, left knee: Secondary | ICD-10-CM | POA: Diagnosis not present

## 2022-08-25 DIAGNOSIS — M7062 Trochanteric bursitis, left hip: Secondary | ICD-10-CM | POA: Diagnosis not present

## 2022-08-25 DIAGNOSIS — M7061 Trochanteric bursitis, right hip: Secondary | ICD-10-CM | POA: Diagnosis not present

## 2022-08-28 DIAGNOSIS — K625 Hemorrhage of anus and rectum: Secondary | ICD-10-CM | POA: Diagnosis not present

## 2022-08-28 DIAGNOSIS — Z7189 Other specified counseling: Secondary | ICD-10-CM | POA: Diagnosis not present

## 2022-09-11 DIAGNOSIS — Z23 Encounter for immunization: Secondary | ICD-10-CM | POA: Diagnosis not present

## 2022-09-14 ENCOUNTER — Ambulatory Visit: Payer: Medicare Other | Admitting: Cardiology

## 2022-09-18 ENCOUNTER — Telehealth: Payer: Self-pay | Admitting: Internal Medicine

## 2022-09-18 DIAGNOSIS — M5416 Radiculopathy, lumbar region: Secondary | ICD-10-CM | POA: Diagnosis not present

## 2022-09-18 NOTE — Telephone Encounter (Signed)
Patient called overnight on-call service. He felt funny and checked HR which was noted to be in the 150s for the past hour. Asked to check BP which was 150/90. Taking diltiazem 150 mg daily.   Instructed to take additional dose of diltiazem and come to the ED.  Leota Jacobsen, MD

## 2022-09-21 DIAGNOSIS — L570 Actinic keratosis: Secondary | ICD-10-CM | POA: Diagnosis not present

## 2022-09-21 DIAGNOSIS — D2271 Melanocytic nevi of right lower limb, including hip: Secondary | ICD-10-CM | POA: Diagnosis not present

## 2022-09-21 DIAGNOSIS — D2272 Melanocytic nevi of left lower limb, including hip: Secondary | ICD-10-CM | POA: Diagnosis not present

## 2022-09-21 DIAGNOSIS — D2262 Melanocytic nevi of left upper limb, including shoulder: Secondary | ICD-10-CM | POA: Diagnosis not present

## 2022-09-21 DIAGNOSIS — L720 Epidermal cyst: Secondary | ICD-10-CM | POA: Diagnosis not present

## 2022-09-21 DIAGNOSIS — Z85828 Personal history of other malignant neoplasm of skin: Secondary | ICD-10-CM | POA: Diagnosis not present

## 2022-09-21 DIAGNOSIS — L814 Other melanin hyperpigmentation: Secondary | ICD-10-CM | POA: Diagnosis not present

## 2022-09-21 DIAGNOSIS — D225 Melanocytic nevi of trunk: Secondary | ICD-10-CM | POA: Diagnosis not present

## 2022-09-21 DIAGNOSIS — D1801 Hemangioma of skin and subcutaneous tissue: Secondary | ICD-10-CM | POA: Diagnosis not present

## 2022-09-21 DIAGNOSIS — D2261 Melanocytic nevi of right upper limb, including shoulder: Secondary | ICD-10-CM | POA: Diagnosis not present

## 2022-09-21 DIAGNOSIS — L821 Other seborrheic keratosis: Secondary | ICD-10-CM | POA: Diagnosis not present

## 2022-09-22 DIAGNOSIS — Z23 Encounter for immunization: Secondary | ICD-10-CM | POA: Diagnosis not present

## 2022-10-03 ENCOUNTER — Other Ambulatory Visit: Payer: Self-pay | Admitting: Cardiology

## 2022-10-09 ENCOUNTER — Other Ambulatory Visit: Payer: Self-pay | Admitting: Internal Medicine

## 2022-10-09 DIAGNOSIS — Z1331 Encounter for screening for depression: Secondary | ICD-10-CM | POA: Diagnosis not present

## 2022-10-09 DIAGNOSIS — M48061 Spinal stenosis, lumbar region without neurogenic claudication: Secondary | ICD-10-CM | POA: Diagnosis not present

## 2022-10-09 DIAGNOSIS — Z5181 Encounter for therapeutic drug level monitoring: Secondary | ICD-10-CM | POA: Diagnosis not present

## 2022-10-09 DIAGNOSIS — I480 Paroxysmal atrial fibrillation: Secondary | ICD-10-CM | POA: Diagnosis not present

## 2022-10-09 DIAGNOSIS — Z125 Encounter for screening for malignant neoplasm of prostate: Secondary | ICD-10-CM | POA: Diagnosis not present

## 2022-10-09 DIAGNOSIS — R7303 Prediabetes: Secondary | ICD-10-CM | POA: Diagnosis not present

## 2022-10-09 DIAGNOSIS — Z0000 Encounter for general adult medical examination without abnormal findings: Secondary | ICD-10-CM | POA: Diagnosis not present

## 2022-10-09 DIAGNOSIS — E291 Testicular hypofunction: Secondary | ICD-10-CM | POA: Diagnosis not present

## 2022-10-09 DIAGNOSIS — I2510 Atherosclerotic heart disease of native coronary artery without angina pectoris: Secondary | ICD-10-CM | POA: Diagnosis not present

## 2022-10-09 DIAGNOSIS — Z122 Encounter for screening for malignant neoplasm of respiratory organs: Secondary | ICD-10-CM

## 2022-10-09 DIAGNOSIS — Z23 Encounter for immunization: Secondary | ICD-10-CM | POA: Diagnosis not present

## 2022-10-09 DIAGNOSIS — G4733 Obstructive sleep apnea (adult) (pediatric): Secondary | ICD-10-CM | POA: Diagnosis not present

## 2022-10-09 DIAGNOSIS — I700 Atherosclerosis of aorta: Secondary | ICD-10-CM | POA: Diagnosis not present

## 2022-10-09 DIAGNOSIS — D6869 Other thrombophilia: Secondary | ICD-10-CM | POA: Diagnosis not present

## 2022-10-10 ENCOUNTER — Encounter: Payer: Self-pay | Admitting: Cardiology

## 2022-10-10 ENCOUNTER — Ambulatory Visit: Payer: Medicare Other | Attending: Cardiology | Admitting: Cardiology

## 2022-10-10 VITALS — BP 100/60 | HR 65 | Ht 72.0 in | Wt 229.6 lb

## 2022-10-10 DIAGNOSIS — E785 Hyperlipidemia, unspecified: Secondary | ICD-10-CM | POA: Diagnosis not present

## 2022-10-10 DIAGNOSIS — I2510 Atherosclerotic heart disease of native coronary artery without angina pectoris: Secondary | ICD-10-CM | POA: Diagnosis not present

## 2022-10-10 DIAGNOSIS — I10 Essential (primary) hypertension: Secondary | ICD-10-CM

## 2022-10-10 DIAGNOSIS — I739 Peripheral vascular disease, unspecified: Secondary | ICD-10-CM | POA: Diagnosis not present

## 2022-10-10 DIAGNOSIS — I480 Paroxysmal atrial fibrillation: Secondary | ICD-10-CM

## 2022-10-10 MED ORDER — ROSUVASTATIN CALCIUM 5 MG PO TABS: 5.0000 mg | ORAL_TABLET | Freq: Every day | ORAL | 3 refills | Status: DC

## 2022-10-10 MED ORDER — DILTIAZEM HCL ER 180 MG PO CP24: 180.0000 mg | ORAL_CAPSULE | Freq: Every day | ORAL | 3 refills | Status: DC

## 2022-10-10 NOTE — Patient Instructions (Signed)
Medication Instructions:  START rosuvastatin (Crestor) 5 mg daily  *If you need a refill on your cardiac medications before your next appointment, please call your pharmacy*   Lab Work: Please return for FASTING labs in 2-3 months (Lipid)  Our in office lab hours are Monday-Friday 8:00-4:00, closed for lunch 12:45-1:45 pm.  No appointment needed.  [facility] locations:   [place] ([place]) - [address] [place] [address] - [address] [place] - [address] ([facility])  Follow-Up: At [facility], you and your health needs are our priority.  As part of our continuing mission to provide you with exceptional heart care, we have created designated Provider Care Teams.  These Care Teams include your primary Cardiologist (physician) and Advanced Practice Providers (APPs -  Physician Assistants and Nurse Practitioners) who all work together to provide you with the care you need, when you need it.  We recommend signing up for the patient portal called "MyChart".  Sign up information is provided on this After Visit Summary.  MyChart is used to connect with patients for Virtual Visits (Telemedicine).  Patients are able to view lab/test results, encounter notes, upcoming appointments, etc.  Non-urgent messages can be sent to your provider as well.   To learn more about what you can do with MyChart, go to [email or web address].    Your next appointment:   6 month(s)  The format for your next appointment:   In Person  Provider:   Dr. Gardiner Rhyme

## 2022-10-10 NOTE — Progress Notes (Signed)
Cardiology Office Note:    Date:  10/10/2022   ID:  [name], DOB 15-Apr-1951, MRN [id number]  PCP:  Kathalene Frames, MD  Cardiologist:  None  Electrophysiologist:  Vickie Epley, MD   Referring MD: Lavone Orn, MD   Chief Complaint  Patient presents with   Coronary Artery Disease     History of Present Illness:    [name] is a 71 y.o. male with a hx of paroxysmal atrial fibrillation, hypertension, OSA, DVT, prostate cancer, PAD, hyperlipidemia who presents for follow-up.  He has had issues with recurrent venous thromboembolism, currently on Eliquis.  He was admitted to [facility] in October 2017 with atrial fibrillation.  He converted to sinus rhythm during admission and was started on flecainide and diltiazem.  Echocardiogram at that time showed normal LV function, moderate to severe left atrial dilatation, no significant valvular disease.  He spends 6 months/year in [place] and reports that in 2020 established with a cardiologist in [place].  Stress test was done, though patient denied having any symptoms.  Stress test was abnormal and underwent cardiac catheterization which showed 60 to 70% stenosis of D1.  Medical management was recommended.  Reports that in August 2021 he had his first recurrence of atrial fibrillation since 2017.  States that he went into AF for about 1 hour with heart rate in 150s.  Converted spontaneously.  Reports that he is active, walks 2.5 miles per day.  Denies any exertional symptoms.  Does report rare resting chest pain that he describes as left-sided sharp pain, but no exertional chest pain.  Denies any lightheadedness or syncope.  Reports that he smoked for 20 years, up to 2 packs/day but quit in 2000.  No known history of heart disease in his immediate family.  Echocardiogram 09/08/2020 showed normal biventricular function, mild AI, dilatation of the ascending aorta measuring 42 mm.  CTA chest on 09/20/2020 showed mild dilatation of the  ascending aorta measuring up to 40 mm.  MRA 09/12/2021 showed stable ascending aortic dilatation measuring 40 mm.  Underwent A. fib ablation with Dr. Quentin Ore on 11/25/2020.  Since last clinic visit, he reports he has been doing okay.  Has had issues with hip pain, resolved with stopping Lipitor.  He denies any chest pain, dyspnea, lightheadedness, or syncope.  Does report some lower extremity edema.  Had episode of A-fib on 10/16.  On his Kardia device noted he was in atrial fibrillation with rate 150s.  He relates he had not taken his diltiazem that day.  Took his diltiazem and within 1 hour his symptoms resolved and he was back in sinus rhythm.  He reports compliance with Eliquis, denies any bleeding issues.  Reports compliance with CPAP.  Wt Readings from Last 3 Encounters:  10/10/22 229 lb 9.6 oz (104.1 kg)  05/30/22 230 lb (104.3 kg)  09/30/21 227 lb (103 kg)       Past Medical History:  Diagnosis Date   Arthritis    arthritis -back   Atrial fibrillation ([place])    a. diagnosed 09/2016 - started on Cardizem CD and Flecainide. Continue Coumadin for anticoagulation.   Cancer [facility])    cancer Prostate- surgery only   Chronic anticoagulation 09/10/2016   History of DVT (deep vein thrombosis)    History of DVT of lower extremity    left leg has some residual crculation issues   History of prostate cancer    Hyperlipidemia 09/09/2018   Hypertension    Hypertensive heart disease without CHF  09/29/2016   Obesity (BMI 30-39.9)    Paroxysmal atrial fibrillation (HCC)    CHA2DS2VASC score 2   Sleep apnea    a. uses CPAP   Sleep apnea in adult     Past Surgical History:  Procedure Laterality Date   ATRIAL FIBRILLATION ABLATION N/A 11/25/2020   Procedure: ATRIAL FIBRILLATION ABLATION;  Surgeon: Vickie Epley, MD;  Location: [place] [facility];  Service: Cardiovascular;  Laterality: N/A;   BALLOON DILATION N/A 10/12/2015   Procedure: BALLOON DILATION;  Surgeon: Garlan Fair, MD;   Location: [facility];  Service: Endoscopy;  Laterality: N/A;   COLONOSCOPY W/ POLYPECTOMY     x2 colon polyps found   COLONOSCOPY WITH PROPOFOL N/A 10/12/2015   Procedure: COLONOSCOPY WITH PROPOFOL;  Surgeon: Garlan Fair, MD;  Location: [facility];  Service: Endoscopy;  Laterality: N/A;   ESOPHAGOGASTRODUODENOSCOPY (EGD) WITH PROPOFOL N/A 10/12/2015   Procedure: ESOPHAGOGASTRODUODENOSCOPY (EGD) WITH PROPOFOL;  Surgeon: Garlan Fair, MD;  Location: [facility];  Service: Endoscopy;  Laterality: N/A;   SHOULDER ARTHROTOMY     x2 procedures- 1 open, 1 scope.   TONSILLECTOMY     TRANSURETHRAL RESECTION OF PROSTATE      Current Medications: Current Meds  Medication Sig   apixaban (ELIQUIS) 5 MG TABS tablet Take 1 tablet (5 mg total) by mouth 2 (two) times daily.   Calcium Carbonate-Vitamin D 600-200 MG-UNIT CAPS Take 1 tablet by mouth daily with breakfast.   Cholecalciferol (VITAMIN D) 50 MCG (2000 UT) tablet Take 2,000 Units by mouth daily.   Coenzyme Q10 (COQ-10) 200 MG CAPS Take 200 mg by mouth at bedtime.   FOLIC ACID PO Take 2,774 mcg by mouth daily.   lisinopril-hydrochlorothiazide (ZESTORETIC) 20-25 MG tablet Take 1 tablet by mouth daily.   MAGNESIUM CITRATE PO Take 1,000 mg by mouth in the morning.   potassium chloride SA (K-DUR,KLOR-CON) 20 MEQ tablet Take 20 mEq by mouth at bedtime.   rosuvastatin (CRESTOR) 5 MG tablet Take 1 tablet (5 mg total) by mouth daily.   Testosterone 20.25 MG/ACT (1.62%) GEL Apply 3 Pump topically daily.   vitamin B-12 (CYANOCOBALAMIN) 1000 MCG tablet Take 1,000 mcg by mouth daily.   [DISCONTINUED] diltiazem (DILACOR XR) 180 MG 24 hr capsule Take 1 capsule (180 mg total) by mouth daily. Please keep upcoming appointment for future refills. Thank you.     Allergies:   Scallops [shellfish allergy], Lactose, and Zonisamide   Social History   Socioeconomic History   Marital status: Married    Spouse name: Not on file   Number of children: Not  on file   Years of education: Not on file   Highest education level: Not on file  Occupational History   Not on file  Tobacco Use   Smoking status: Former    Years: 15.00    Types: Cigarettes    Quit date: 12/04/1998    Years since quitting: 23.8   Smokeless tobacco: Never  Substance and Sexual Activity   Alcohol use: No   Drug use: No   Sexual activity: Not on file  Other Topics Concern   Not on file  Social History Narrative   Not on file   Social Determinants of Health   Financial Resource Strain: Not on file  Food Insecurity: Not on file  Transportation Needs: Not on file  Physical Activity: Not on file  Stress: Not on file  Social Connections: Not on file     Family History: The patient's  family history includes Hypertension in his brother and sister; Prostate cancer in his brother.  ROS:   Please see the history of present illness.     All other systems reviewed and are negative.  EKGs/Labs/Other Studies Reviewed:    The following studies were reviewed today:  US Venous Left LE 11/30/2020: 1. No evidence of acute DVT within the left lower extremity. 2. The examination is positive for nonocclusive wall thickening/chronic DVT involving the distal aspect of the left femoral vein and the left popliteal vein, similar to remote examination performed in 2010. 3. Chronic occlusive superficial thrombophlebitis involving the proximal aspect of the left greater saphenous vein as well as the imaged portions of the left lesser saphenous vein.  EP Study/Afib Ablation 11/25/2020: CONCLUSIONS: 1. Successful PVI 2. Intracardiac echo reveals vertical heart, normal LV function, 4 PVs and trivial pericardial effusion. 3. No early apparent complications.  CT Angio Chest 09/20/2020: 1. Ascending thoracic aortic caliber 4.0 cm axial dimension. Mildly aneurysmal. Recommend annual imaging followup by CTA or MRA. This recommendation follows  2010 ACCF/AHA/AATS/ACR/ASA/SCA/SCAI/SIR/STS/SVM Guidelines for the Diagnosis and Management of Patients with Thoracic Aortic Disease. Circulation. 2010; 121: P950-D326. Aortic aneurysm NOS (ICD10-I71.9) 2. Coronary artery disease with calcification of the LEFT coronary circulation. 3. Renal and cystic hepatic lesions. Please see prior abdominal CT for further detail regarding follow-up. 4. Aortic atherosclerosis.   Aortic Atherosclerosis (ICD10-I70.0).  Echo 09/08/2020:  1. Left ventricular ejection fraction, by estimation, is 60 to 65%. The  left ventricle has normal function. The left ventricle has no regional  wall motion abnormalities. There is mild concentric left ventricular  hypertrophy. Left ventricular diastolic  parameters were normal.   2. Right ventricular systolic function is normal. The right ventricular  size is normal. There is normal pulmonary artery systolic pressure. The  estimated right ventricular systolic pressure is 71.2 mmHg.   3. The mitral valve is grossly normal. Trivial mitral valve  regurgitation. No evidence of mitral stenosis.   4. The aortic valve is tricuspid. There is moderate calcification of the  aortic valve. There is moderate thickening of the aortic valve. Aortic  valve regurgitation is mild. Mild to moderate aortic valve  sclerosis/calcification is present, without any  evidence of aortic stenosis.   5. There is mild dilatation of the ascending aorta, measuring 42 mm.   6. The inferior vena cava is normal in size with greater than 50%  respiratory variability, suggesting right atrial pressure of 3 mmHg.   Comparison(s): Changes from prior study are noted. EF normal 60-65%.  Normal LA. Mild AI.   EKG:   10/10/22: NSR with PACs, rate 65 05/30/2022: Sinus rhythm, rate 64, PACs, no ST abnormalities 05/12/2021: Sinus rhythm with frequent PACs, rate 63, no ST abnormalities 10/11/2020: EKG was not ordered.  08/25/2020: normal sinus rhythm, rate 60, no  ST/T abnormalities.    Recent Labs: 05/30/2022: ALT 17; BUN 11; Creatinine, Ser 0.99; Hemoglobin 14.9; Platelets 186; Potassium 3.9; Sodium 135  Recent Lipid Panel    Component Value Date/Time   CHOL 136 05/30/2022 1407   TRIG 67 05/30/2022 1407   HDL 65 05/30/2022 1407   CHOLHDL 2.1 05/30/2022 1407   CHOLHDL 3.4 09/09/2016 0421   VLDL 15 09/09/2016 0421   LDLCALC 57 05/30/2022 1407    Physical Exam:    VS:  BP 100/60   Pulse 65   Ht 6' (1.829 m)   Wt 229 lb 9.6 oz (104.1 kg)   SpO2 98%   BMI 31.14 kg/m  Wt Readings from Last 3 Encounters:  10/10/22 229 lb 9.6 oz (104.1 kg)  05/30/22 230 lb (104.3 kg)  09/30/21 227 lb (103 kg)     GEN:  Well nourished, well developed in no acute distress HEENT: Normal NECK: No JVD; No carotid bruits CARDIAC: RRR, no murmurs, rubs, gallops RESPIRATORY:  Clear to auscultation without rales, wheezing or rhonchi  ABDOMEN: Soft, non-tender, non-distended MUSCULOSKELETAL:  No edema; No deformity  SKIN: Warm and dry NEUROLOGIC:  Alert and oriented x 3 PSYCHIATRIC:  Normal affect   ASSESSMENT:    1. Paroxysmal atrial fibrillation (HCC)   2. Coronary artery disease involving native coronary artery of native heart without angina pectoris   3. Hyperlipidemia, unspecified hyperlipidemia type   4. PAD (peripheral artery disease) ([place])   5. Essential hypertension       PLAN:    Paroxysmal atrial fibrillation/flutter: CHA2DS2-VASc score 3 (hypertension, age, PAD).  Echocardiogram 09/08/2020 showed normal biventricular function, no significant valvular disease.  Underwent ablation with Dr. Quentin Ore on 11/25/2020.  Appears to be maintaining sinus rhythm. -Continue Eliquis 5 mg twice daily -Continue diltiazem 180 mg daily  CAD: Catheterization in [place] in 2021 showed 60 to 70% D1 stenosis.  Managing medically.  Currently denies any chest pain -Continue Eliquis  -Recently stopped atorvastatin due to myalgias.  Will trial rosuvastatin 5 mg  daily.  If unable to tolerate, will refer to lipid clinic for PCSK9 inhibitor evaluation  Aortic dilatation: Ascending aorta measures 40 mm on CTA 09/20/2020.  MRA 09/12/2021 showed stable ascending aortic dilatation measuring 40 mm  Hypertension: On lisinopril-hydrochlorothiazide 20-12.5 mg daily and diltiazem 180 mg daily.  Appears controlled.    PAD: Continue Eliquis and statin  Hyperlipidemia: On atorvastatin 10 mg daily, LDL 57 on 05/30/2022.  However had to stop atorvastatin due to myalgias.  Start rosuvastatin 5 mg daily.  If unable to tolerate, referred to lipid clinic for PCSK9 inhibitor evaluation  DVT: History of recurrent venous thromboembolism.  Continue Eliquis.    OSA: Continue CPAP.  Reports compliance.   Prediabetes: A1c 5.7% on 05/30/2022  Bone lesion: MRI showed abnormal enhancement in ninth rib.  Given history of prostate cancer, could be concerning for osseous metastatic disease.  PSA undetectable.  Bone scan showed focal activity at bilateral ninth costovertebral junctions, felt to be degenerative or posttraumatic   RTC in 6 months  Medication Adjustments/Labs and Tests Ordered: Current medicines are reviewed at length with the patient today.  Concerns regarding medicines are outlined above.  Orders Placed This Encounter  Procedures   Lipid panel   EKG 12-Lead    Meds ordered this encounter  Medications   DISCONTD: diltiazem (DILACOR XR) 180 MG 24 hr capsule    Sig: Take 1 capsule (180 mg total) by mouth daily.    Dispense:  90 capsule    Refill:  3   rosuvastatin (CRESTOR) 5 MG tablet    Sig: Take 1 tablet (5 mg total) by mouth daily.    Dispense:  90 tablet    Refill:  3    Stop atorvastatin   diltiazem (DILACOR XR) 180 MG 24 hr capsule    Sig: Take 1 capsule (180 mg total) by mouth daily.    Dispense:  90 capsule    Refill:  3     Patient Instructions  Medication Instructions:  START rosuvastatin (Crestor) 5 mg daily  *If you need a refill on  your cardiac medications before your next appointment, please call your pharmacy*  Lab Work: Please return for FASTING labs in 2-3 months (Lipid)  Our in office lab hours are Monday-Friday 8:00-4:00, closed for lunch 12:45-1:45 pm.  No appointment needed.  [facility] locations:   [place] ([place]) - [address] [place] - [address] [place] - [address] ([facility])  Follow-Up: At [facility], you and your health needs are our priority.  As part of our continuing mission to provide you with exceptional heart care, we have created designated Provider Care Teams.  These Care Teams include your primary Cardiologist (physician) and Advanced Practice Providers (APPs -  Physician Assistants and Nurse Practitioners) who all work together to provide you with the care you need, when you need it.  We recommend signing up for the patient portal called "MyChart".  Sign up information is provided on this After Visit Summary.  MyChart is used to connect with patients for Virtual Visits (Telemedicine).  Patients are able to view lab/test results, encounter notes, upcoming appointments, etc.  Non-urgent messages can be sent to your provider as well.   To learn more about what you can do with MyChart, go to [email or web address].    Your next appointment:   6 month(s)  The format for your next appointment:   In Person  Provider:   Dr. Gardiner Rhyme            Signed, Donato Heinz, MD  10/10/2022 11:16 AM    [place]

## 2022-10-12 ENCOUNTER — Telehealth: Payer: Self-pay | Admitting: Cardiology

## 2022-10-12 NOTE — Telephone Encounter (Signed)
Dr. Gardiner Rhyme, you saw this patient on 10/10/22. We received request for medical clearance for upcoming nerve root block injection.  Do you agree that he may proceed without further cardiac testing?  Please route your response to p cv div preop.  Thank you, Sharyn Lull

## 2022-10-12 NOTE — Telephone Encounter (Signed)
   Pre-operative Risk Assessment    Patient Name: [name]  DOB: 02/04/1951 MRN: [id number]      Request for Surgical Clearance    Procedure:   Injection - Bilateral L5 selective nerve root block    Date of Surgery:  Clearance TBD                                 Surgeon:    Dr. Silvestre Moment Surgeon's Group or Practice Name:  [facility]  Phone number:  [phone] Fax number:  [phone]   Type of Clearance Requested:   - Medical  - Pharmacy:  Hold Apixaban (Eliquis)     Type of Anesthesia:  Local    Additional requests/questions:   Caller stated they would like to stop the Eliquis 2 days surgery.  Signed, Heloise Beecham   10/12/2022, 2:24 PM

## 2022-10-13 NOTE — Telephone Encounter (Signed)
No further cardiac work-up recommended prior to his procedure.  He is okay to hold his Eliquis for 2 days prior to the procedure

## 2022-10-13 NOTE — Telephone Encounter (Signed)
   Primary Cardiologist: None  Chart reviewed as part of pre-operative protocol coverage. Given past medical history and time since last visit, based on ACC/AHA guidelines, [name] would be at acceptable risk for the planned procedure without further cardiovascular testing.   Per office protocol, patient can hold Eliquis for 3 days prior to procedure. (High bleeding risk - spinal injections)  Patient will not need bridging with Lovenox (enoxaparin) around procedure.  I will route this recommendation to the requesting party via Epic fax function and remove from pre-op pool.  Please call with questions.  Emmaline Life, NP-C  10/13/2022, 9:32 AM [address] Office [phone] Fax [phone]

## 2022-10-13 NOTE — Telephone Encounter (Signed)
Patient with diagnosis of atrial fibriliation on Eliquis for anticoagulation.    Procedure: Injection - Bilateral L5 selective nerve root block  Date of procedure: TBD   CHA2DS2-VASc Score = 3   This indicates a 3.2% annual risk of stroke. The patient's score is based upon: CHF History: 0 HTN History: 1 Diabetes History: 0 Stroke History: 0 Vascular Disease History: 1 Age Score: 1 Gender Score: 0   CrCl 84 ml/min (Adj BW) (SrCr. 0.99 on 05/30/2022) Platelet count 186 (05/30/2022)   Per office protocol, patient can hold Eliquis for 3 days prior to procedure. (High bleeding risk - spinal injections)  Patient will not need bridging with Lovenox (enoxaparin) around procedure.  **This guidance is not considered finalized until pre-operative APP has relayed final recommendations.**

## 2022-10-24 DIAGNOSIS — E291 Testicular hypofunction: Secondary | ICD-10-CM | POA: Diagnosis not present

## 2022-10-24 DIAGNOSIS — C61 Malignant neoplasm of prostate: Secondary | ICD-10-CM | POA: Diagnosis not present

## 2022-10-24 DIAGNOSIS — R5383 Other fatigue: Secondary | ICD-10-CM | POA: Diagnosis not present

## 2022-10-24 DIAGNOSIS — E790 Hyperuricemia without signs of inflammatory arthritis and tophaceous disease: Secondary | ICD-10-CM | POA: Diagnosis not present

## 2022-10-24 DIAGNOSIS — I10 Essential (primary) hypertension: Secondary | ICD-10-CM | POA: Diagnosis not present

## 2022-10-24 DIAGNOSIS — N390 Urinary tract infection, site not specified: Secondary | ICD-10-CM | POA: Diagnosis not present

## 2022-10-24 DIAGNOSIS — E559 Vitamin D deficiency, unspecified: Secondary | ICD-10-CM | POA: Diagnosis not present

## 2022-10-24 DIAGNOSIS — E782 Mixed hyperlipidemia: Secondary | ICD-10-CM | POA: Diagnosis not present

## 2022-11-01 DIAGNOSIS — R5383 Other fatigue: Secondary | ICD-10-CM | POA: Diagnosis not present

## 2022-11-01 DIAGNOSIS — N390 Urinary tract infection, site not specified: Secondary | ICD-10-CM | POA: Diagnosis not present

## 2022-11-01 DIAGNOSIS — E538 Deficiency of other specified B group vitamins: Secondary | ICD-10-CM | POA: Diagnosis not present

## 2022-11-01 DIAGNOSIS — E782 Mixed hyperlipidemia: Secondary | ICD-10-CM | POA: Diagnosis not present

## 2022-11-01 DIAGNOSIS — E291 Testicular hypofunction: Secondary | ICD-10-CM | POA: Diagnosis not present

## 2022-11-01 DIAGNOSIS — R7301 Impaired fasting glucose: Secondary | ICD-10-CM | POA: Diagnosis not present

## 2022-11-01 DIAGNOSIS — I10 Essential (primary) hypertension: Secondary | ICD-10-CM | POA: Diagnosis not present

## 2022-11-01 DIAGNOSIS — E559 Vitamin D deficiency, unspecified: Secondary | ICD-10-CM | POA: Diagnosis not present

## 2022-11-01 DIAGNOSIS — E790 Hyperuricemia without signs of inflammatory arthritis and tophaceous disease: Secondary | ICD-10-CM | POA: Diagnosis not present

## 2022-11-08 ENCOUNTER — Telehealth: Payer: Self-pay | Admitting: Cardiology

## 2022-11-08 NOTE — Telephone Encounter (Signed)
Patient stated that the rosuvastatin is causing muscle pain. He uses CPAP and cannot lay on left side and get good sleep due to the muscle pina. He stated he is in [place] until May. Please advise.

## 2022-11-08 NOTE — Telephone Encounter (Signed)
Pt c/o medication issue:  1. Name of Medication: Rosuvastatin  2. How are you currently taking this medication (dosage and times per day)? 1 time a day   3. Are you having a reaction (difficulty breathing--STAT)?   4. What is your medication issue? Muscle pain, effecting his sleep

## 2022-11-09 NOTE — Telephone Encounter (Signed)
Patient aware of recommendations and will discuss with his provider in [place].   If they are unable to prescribe/initiate he will let us know.

## 2022-11-09 NOTE — Telephone Encounter (Signed)
Recommend holding rosuvastatin.  Would advise seeing his doctor in [place] about starting PCSK9 inhibitor (Repatha or Praluent).  Alternatively we can get him set up in our pharmacy lipid clinic if he will be back in the area sometime soon

## 2022-11-13 NOTE — Telephone Encounter (Signed)
Returned call to patient.  Patient had to unexpectedly return to town due to emergency with his son.   He is interested in scheduling appointment with the pharmacist to discuss PCSK9 as recommended by Dr. Gardiner Rhyme.   Appt scheduled 12/13 at 1:30 pm.

## 2022-11-13 NOTE — Telephone Encounter (Signed)
Patient is following up, requesting to speak with [facility], RN. He declines discussing with me, stating that he would like to continue his conversation with RN from last week.

## 2022-11-15 ENCOUNTER — Telehealth: Payer: Self-pay | Admitting: Pharmacist

## 2022-11-15 ENCOUNTER — Ambulatory Visit: Payer: Medicare Other | Attending: Cardiovascular Disease | Admitting: Student

## 2022-11-15 DIAGNOSIS — I2510 Atherosclerotic heart disease of native coronary artery without angina pectoris: Secondary | ICD-10-CM

## 2022-11-15 DIAGNOSIS — E782 Mixed hyperlipidemia: Secondary | ICD-10-CM | POA: Insufficient documentation

## 2022-11-15 NOTE — Assessment & Plan Note (Signed)
Assessment:  LDL goal: < 70 mg/dl last LDLc 57 mg/dl (06 /2023) wile on atorvastatin 10 mg daily  Currently on rosuvastatin 5 mg daily and it started hurting his lower legs - affects sleep Unable to tolerate moderate intensity statins   Discussed next potential options (PCSK-9 inhibitors, bempedoic acid and inclisiran); cost, dosing efficacy, side effects  Given the risk factors and intolerability to moderate intensity statins reasonable to try PCSK9i   Plan: Decrease rosuvastatin 5 mg  from daily to every other day or 3 X week ( can reduced to twice week if intolerable)  Will apply for PA for PCSK9i; will inform patient upon approval (prefers MyChart message) Lipid lab due in 2-3 months after starting [facility]

## 2022-11-15 NOTE — Progress Notes (Signed)
Patient ID: [name]                 DOB: Feb 18, 1951                    MRN: [id number]       HPI: [name] is a 71 y.o. male patient referred to lipid clinic by Dr.Schumann . PMH is significant for paroxysmal atrial fibrillation, hypertension, OSA, History of recurrent DVT, prostate cancer, PAD, hyperlipidemia, pre-diabetes.  At last visit with Dr. Gardiner Rhyme in Nov patient's atorvastatin 10 mg was changed to rosuvastatin 5 mg due to myalgia. Patient is unable to tolerate the lower dose. His big muscles in both legs started bothering him- affects sleep. He was in [place] beginning of the month where he saw his internist who had checked his lipid. Does not recall the exact numbers but will share the result vial MyChart later today. He agree to try 3 times per weeks Crestor 5 mg instead of daily dose to see if that would lower the pain in his lower extremities. Today we reviewed next potential options for lowering LDL cholesterol, including ezetimibe, PCSK9I-9 inhibitors, bempedoic acid and inclisiran.  Discussed mechanisms of action, dosing, side effects and potential decreases in LDL cholesterol.  Also reviewed cost information and potential options for patient assistance.  Current Medications: Crestor 5 mg daily Intolerances: Atorvastatin 10 mg -myalgia  Risk Factors: obesity, hypertension, 10 years ASCVD risk >8, CAD: Catheterization in [place] in 2021 showed 60 to 70% D1 stenosis  LDL goal: <70  Diet: healthy diet, low salt, eat out -once every other week  Snack - fruits   Exercise: slow walk due to back pain walk 2-3 miles , swimming, 3-4 times per week for 1 hour   Family History: No known history of heart disease in his immediate family   Social History:  smoked for 20 years, up to 2 packs/day but quit in 2000  Alcohol: none Recreational drug use: none Labs: Lipid Panel     Component Value Date/Time   CHOL 136 05/30/2022 1407   TRIG 67 05/30/2022 1407   HDL 65  05/30/2022 1407   CHOLHDL 2.1 05/30/2022 1407   CHOLHDL 3.4 09/09/2016 0421   VLDL 15 09/09/2016 0421   LDLCALC 57 05/30/2022 1407   LABVLDL 14 05/30/2022 1407    Past Medical History:  Diagnosis Date   Arthritis    arthritis -back   Atrial fibrillation ([place])    a. diagnosed 09/2016 - started on Cardizem CD and Flecainide. Continue Coumadin for anticoagulation.   Cancer [facility])    cancer Prostate- surgery only   Chronic anticoagulation 09/10/2016   History of DVT (deep vein thrombosis)    History of DVT of lower extremity    left leg has some residual crculation issues   History of prostate cancer    Hyperlipidemia 09/09/2018   Hypertension    Hypertensive heart disease without CHF 09/29/2016   Obesity (BMI 30-39.9)    Paroxysmal atrial fibrillation (HCC)    CHA2DS2VASC score 2   Sleep apnea    a. uses CPAP   Sleep apnea in adult     Current Outpatient Medications on File Prior to Visit  Medication Sig Dispense Refill   apixaban (ELIQUIS) 5 MG TABS tablet Take 1 tablet (5 mg total) by mouth 2 (two) times daily. 60 tablet    Calcium Carbonate-Vitamin D 600-200 MG-UNIT CAPS Take 1 tablet by mouth daily with breakfast.     Cholecalciferol (VITAMIN  D) 50 MCG (2000 UT) tablet Take 2,000 Units by mouth daily.     Coenzyme Q10 (COQ-10) 200 MG CAPS Take 200 mg by mouth at bedtime.     diltiazem (DILACOR XR) 180 MG 24 hr capsule Take 1 capsule (180 mg total) by mouth daily. 90 capsule 3   FOLIC ACID PO Take [id number] mcg by mouth daily.     lisinopril-hydrochlorothiazide (ZESTORETIC) 20-25 MG tablet Take 1 tablet by mouth daily.     MAGNESIUM CITRATE PO Take 1,000 mg by mouth in the morning.     potassium chloride SA (K-DUR,KLOR-CON) 20 MEQ tablet Take 20 mEq by mouth at bedtime.     Testosterone 20.25 MG/ACT (1.62%) GEL Apply 3 Pump topically daily.  1   vitamin B-12 (CYANOCOBALAMIN) 1000 MCG tablet Take 1,000 mcg by mouth daily.     No current facility-administered medications on file  prior to visit.    Allergies  Allergen Reactions   Scallops [Shellfish Allergy] Diarrhea and Nausea And Vomiting   Lactose Diarrhea   Zonisamide Other (See Comments)    Dizziness   Rosuvastatin Other (See Comments)    Muscle aches     Problem  Hyperlipidemia   Current Medications: Crestor 5 mg daily Intolerances: Atorvastatin 10 mg -myalgia  Risk Factors: obesity, hypertension, 10 years ASCVD risk >8, CAD: Catheterization in [place] in 2021 showed 60 to 70% D1 stenosis  LDL goal: <70    Hyperlipidemia Assessment:  LDL goal: < 70 mg/dl last LDLc 57 mg/dl (06 /2023) wile on atorvastatin 10 mg daily  Currently on rosuvastatin 5 mg daily and it started hurting his lower legs - affects sleep Unable to tolerate moderate intensity statins   Discussed next potential options (PCSK-9 inhibitors, bempedoic acid and inclisiran); cost, dosing efficacy, side effects  Given the risk factors and intolerability to moderate intensity statins reasonable to try PCSK9i   Plan: Decrease rosuvastatin 5 mg  from daily to every other day or 3 X week ( can reduced to twice week if intolerable)  Will apply for PA for PCSK9i; will inform patient upon approval (prefers MyChart message) Lipid lab due in 2-3 months after starting PCSK9i    Thank you,  Cammy Copa, Pharm.D [facility] A Division of [facility] [place] [address] [place], [place] [address]  Phone: [phone]; Fax: [phone]

## 2022-11-15 NOTE — Telephone Encounter (Signed)
Repatha PA has been approved through 05/17/2023  Enrolled in the [place]. [id number]   CARD STATUS Active   BIN [id number]   PCN [id number]   PC GROUP [id number]  Patient has been informed via [place]

## 2022-11-15 NOTE — Patient Instructions (Signed)
  Medication changes: We will start the process to get PCSK9i (Repatha or Praluent) covered by your insurance.  Once the prior authorization is complete, we will call you to let you know and confirm pharmacy information.      Praluent is a cholesterol medication that improved your body's ability to get rid of "bad cholesterol" known as LDL. It can lower your LDL up to 60%. It is an injection that is given under the skin every 2 weeks. The most common side effects of Praluent include runny nose, symptoms of the common cold, rarely flu or flu-like symptoms, back/muscle pain in about 3-4% of the patients, and redness, pain, or bruising at the injection site.    Repatha is a cholesterol medication that improved your body's ability to get rid of "bad cholesterol" known as LDL. It can lower your LDL up to 60%! It is an injection that is given under the skin every 2 weeks. The most common side effects of Repatha include runny nose, symptoms of the common cold, rarely flu or flu-like symptoms, back/muscle pain in about 3-4% of the patients, and redness, pain, or bruising at the injection site.   Lab orders: We want to repeat labs after 2-3 months.  We will send you a lab order to remind you once we get closer to that time.        Cammy Copa, Pharm.D

## 2022-11-16 ENCOUNTER — Encounter: Payer: Self-pay | Admitting: Pharmacist

## 2022-11-16 MED ORDER — REPATHA SURECLICK 140 MG/ML ~~LOC~~ SOAJ: 1.0000 mL | SUBCUTANEOUS | 11 refills | Status: DC

## 2022-11-16 NOTE — Addendum Note (Signed)
Addended by: Marcelle Overlie D on: 11/16/2022 04:35 PM   Modules accepted: Orders

## 2022-11-24 ENCOUNTER — Encounter: Payer: Self-pay | Admitting: Student

## 2023-01-10 DIAGNOSIS — I2510 Atherosclerotic heart disease of native coronary artery without angina pectoris: Secondary | ICD-10-CM | POA: Diagnosis not present

## 2023-01-10 DIAGNOSIS — E785 Hyperlipidemia, unspecified: Secondary | ICD-10-CM | POA: Diagnosis not present

## 2023-01-11 ENCOUNTER — Telehealth: Payer: Self-pay | Admitting: Cardiology

## 2023-01-11 ENCOUNTER — Encounter: Payer: Self-pay | Admitting: Cardiology

## 2023-01-11 NOTE — Telephone Encounter (Signed)
Patient is asking that you give him a call back. Please advise

## 2023-02-06 DIAGNOSIS — H2513 Age-related nuclear cataract, bilateral: Secondary | ICD-10-CM | POA: Diagnosis not present

## 2023-02-15 DIAGNOSIS — L820 Inflamed seborrheic keratosis: Secondary | ICD-10-CM | POA: Diagnosis not present

## 2023-02-15 DIAGNOSIS — L720 Epidermal cyst: Secondary | ICD-10-CM | POA: Diagnosis not present

## 2023-02-15 DIAGNOSIS — D492 Neoplasm of unspecified behavior of bone, soft tissue, and skin: Secondary | ICD-10-CM | POA: Diagnosis not present

## 2023-02-15 DIAGNOSIS — L821 Other seborrheic keratosis: Secondary | ICD-10-CM | POA: Diagnosis not present

## 2023-03-08 DIAGNOSIS — E291 Testicular hypofunction: Secondary | ICD-10-CM | POA: Diagnosis not present

## 2023-03-08 DIAGNOSIS — E559 Vitamin D deficiency, unspecified: Secondary | ICD-10-CM | POA: Diagnosis not present

## 2023-03-08 DIAGNOSIS — E790 Hyperuricemia without signs of inflammatory arthritis and tophaceous disease: Secondary | ICD-10-CM | POA: Diagnosis not present

## 2023-03-08 DIAGNOSIS — E538 Deficiency of other specified B group vitamins: Secondary | ICD-10-CM | POA: Diagnosis not present

## 2023-03-08 DIAGNOSIS — R7301 Impaired fasting glucose: Secondary | ICD-10-CM | POA: Diagnosis not present

## 2023-03-08 DIAGNOSIS — I10 Essential (primary) hypertension: Secondary | ICD-10-CM | POA: Diagnosis not present

## 2023-03-08 DIAGNOSIS — E782 Mixed hyperlipidemia: Secondary | ICD-10-CM | POA: Diagnosis not present

## 2023-03-08 DIAGNOSIS — R5383 Other fatigue: Secondary | ICD-10-CM | POA: Diagnosis not present

## 2023-03-08 DIAGNOSIS — N390 Urinary tract infection, site not specified: Secondary | ICD-10-CM | POA: Diagnosis not present

## 2023-03-15 ENCOUNTER — Telehealth: Payer: Self-pay | Admitting: Cardiology

## 2023-03-15 DIAGNOSIS — M159 Polyosteoarthritis, unspecified: Secondary | ICD-10-CM | POA: Diagnosis not present

## 2023-03-15 DIAGNOSIS — I480 Paroxysmal atrial fibrillation: Secondary | ICD-10-CM | POA: Diagnosis not present

## 2023-03-15 DIAGNOSIS — R7301 Impaired fasting glucose: Secondary | ICD-10-CM | POA: Diagnosis not present

## 2023-03-15 DIAGNOSIS — E782 Mixed hyperlipidemia: Secondary | ICD-10-CM | POA: Diagnosis not present

## 2023-03-15 DIAGNOSIS — N390 Urinary tract infection, site not specified: Secondary | ICD-10-CM | POA: Diagnosis not present

## 2023-03-15 DIAGNOSIS — I10 Essential (primary) hypertension: Secondary | ICD-10-CM | POA: Diagnosis not present

## 2023-03-15 DIAGNOSIS — E538 Deficiency of other specified B group vitamins: Secondary | ICD-10-CM | POA: Diagnosis not present

## 2023-03-15 DIAGNOSIS — E559 Vitamin D deficiency, unspecified: Secondary | ICD-10-CM | POA: Diagnosis not present

## 2023-03-15 DIAGNOSIS — C61 Malignant neoplasm of prostate: Secondary | ICD-10-CM | POA: Diagnosis not present

## 2023-03-15 DIAGNOSIS — R5383 Other fatigue: Secondary | ICD-10-CM | POA: Diagnosis not present

## 2023-03-15 NOTE — Telephone Encounter (Signed)
Patient is requesting to speak with [place], [facility]. Requesting return call.

## 2023-03-16 ENCOUNTER — Other Ambulatory Visit (HOSPITAL_COMMUNITY): Payer: Self-pay

## 2023-03-16 NOTE — Telephone Encounter (Signed)
Patient states he is aware of the below information. He stated that he was told to follow up [facility] in April because his coverage was only approved for 6 months which will be in June. He stated he was following up as recommended.

## 2023-03-16 NOTE — Telephone Encounter (Signed)
Patient is following up, again requesting a call from pharmacist [facility].

## 2023-03-16 NOTE — Telephone Encounter (Signed)
Patient is on repatha and stated that his approval process was only foe 6 months and he needed to follow up in April for another approval.

## 2023-03-16 NOTE — Telephone Encounter (Signed)
  No PA required. Pharmacy filled on 03/08/23 for a 1 month supply. Insurance should pay again on 03/29/23

## 2023-03-19 NOTE — Telephone Encounter (Signed)
I have a note on my spreadsheet as a reminder :)

## 2023-03-21 IMAGING — CR DG HIP (WITH OR WITHOUT PELVIS) 4+V*R*
3 series · 3 of 3 positions shown · non-contrast
Comparison: None.

CLINICAL DATA: Anterior right hip pain

EXAM:
DG HIP (WITH OR WITHOUT PELVIS) 4+V RIGHT

[t pelvis a.p.]
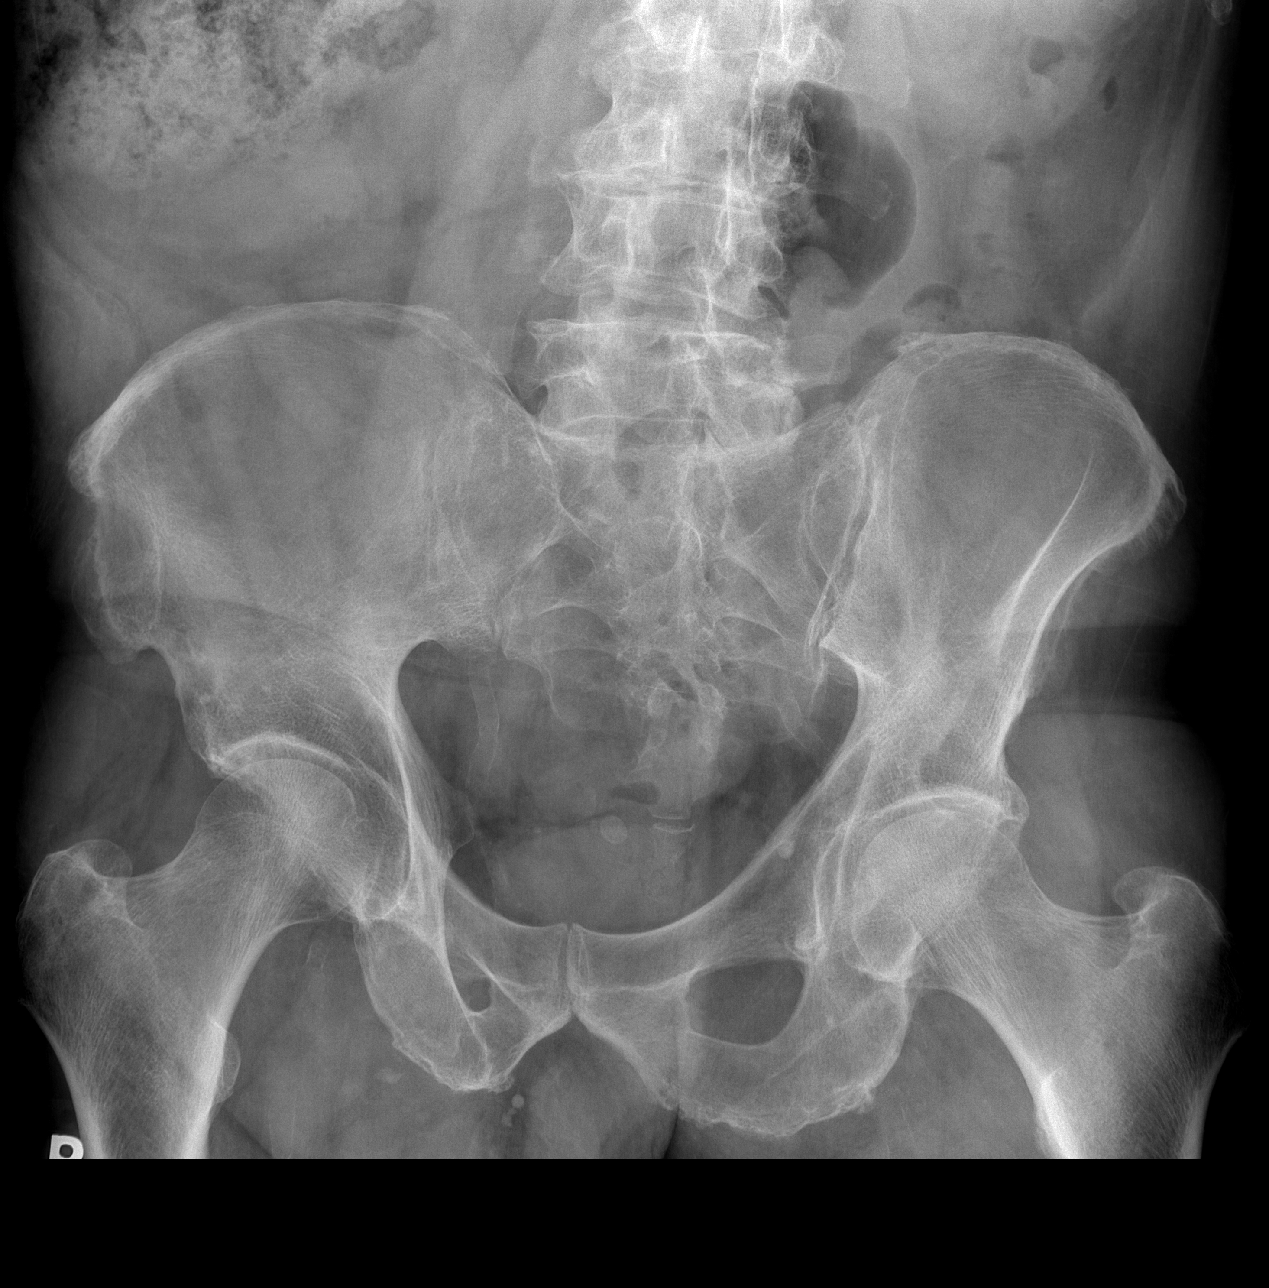

[t hip frog leg right (1 of 2)]
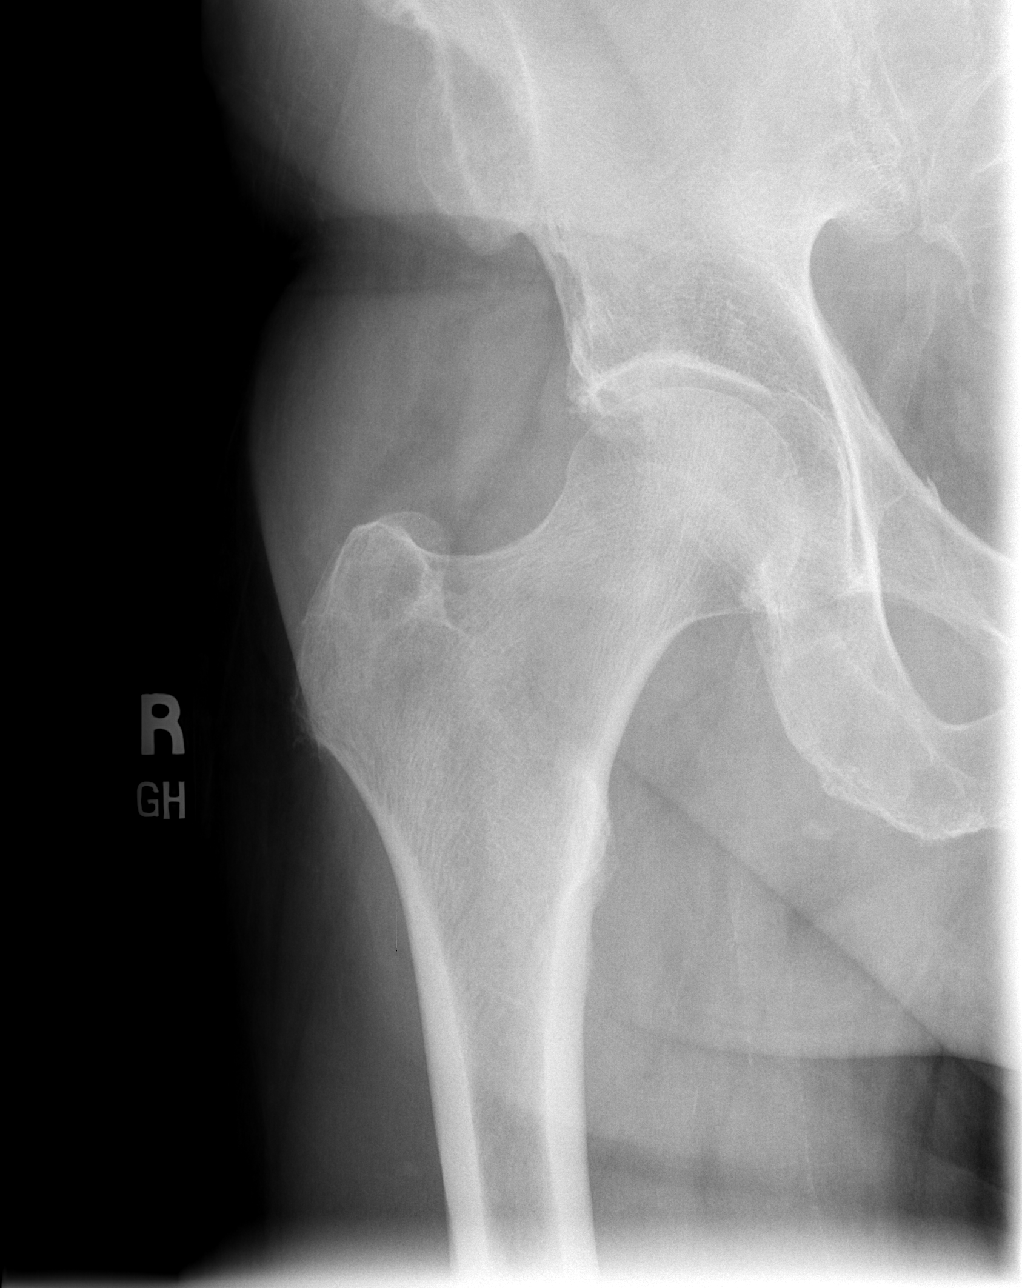

[t hip frog leg right (2 of 2)]
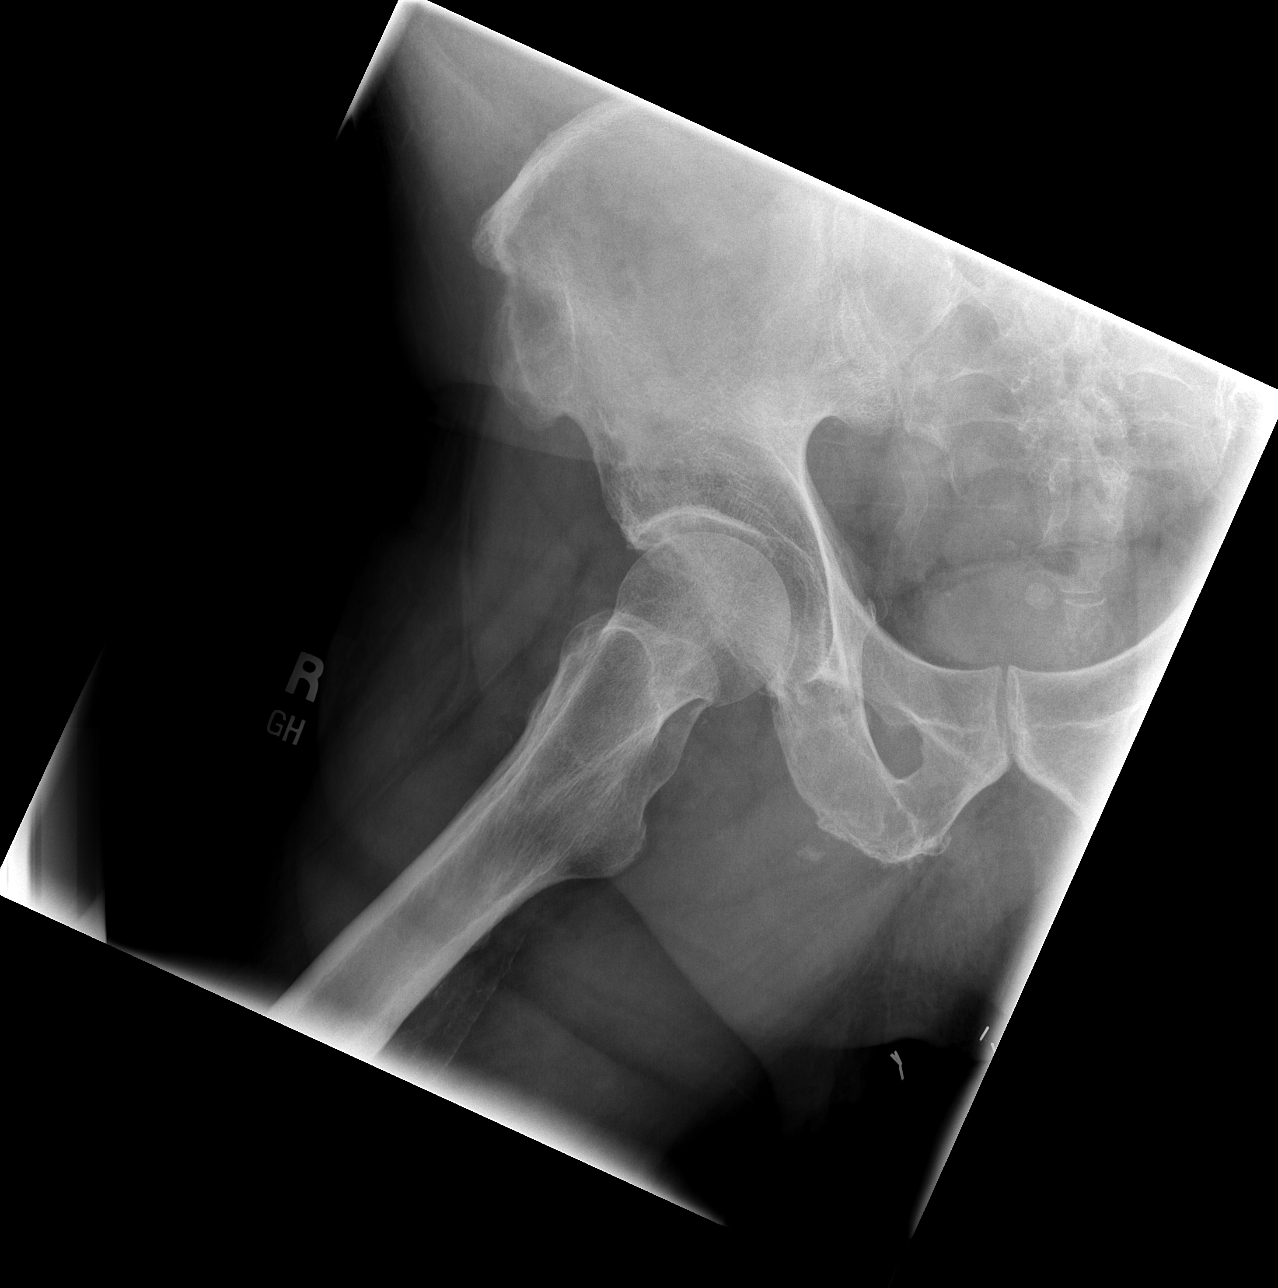

[3 of 3 positions shown; findings below may reference images not displayed]

FINDINGS: There is no evidence of hip fracture or dislocation. There is no
evidence of significant arthropathy or other focal bone abnormality.
Vascular calcifications are present.
IMPRESSION: Negative.

## 2023-04-04 ENCOUNTER — Other Ambulatory Visit (HOSPITAL_COMMUNITY): Payer: Self-pay

## 2023-04-04 NOTE — Telephone Encounter (Signed)
I tried to resubmit apparently expiration is 12/04/23 not May

## 2023-04-04 NOTE — Telephone Encounter (Signed)
Patient has been informed about PA exp date.

## 2023-04-13 ENCOUNTER — Ambulatory Visit: Payer: Medicare Other | Admitting: Cardiology

## 2023-04-23 DIAGNOSIS — H6592 Unspecified nonsuppurative otitis media, left ear: Secondary | ICD-10-CM | POA: Diagnosis not present

## 2023-04-23 DIAGNOSIS — J309 Allergic rhinitis, unspecified: Secondary | ICD-10-CM | POA: Diagnosis not present

## 2023-04-24 ENCOUNTER — Other Ambulatory Visit: Payer: Self-pay | Admitting: Internal Medicine

## 2023-04-24 ENCOUNTER — Ambulatory Visit
Admission: RE | Admit: 2023-04-24 | Discharge: 2023-04-24 | Disposition: A | Discharge status: Home / Self Care | Payer: Medicare Other | Source: Ambulatory Visit | Attending: Internal Medicine | Admitting: Internal Medicine

## 2023-04-24 ENCOUNTER — Other Ambulatory Visit: Payer: Medicare Other

## 2023-04-24 DIAGNOSIS — Z122 Encounter for screening for malignant neoplasm of respiratory organs: Secondary | ICD-10-CM

## 2023-04-24 DIAGNOSIS — I700 Atherosclerosis of aorta: Secondary | ICD-10-CM | POA: Diagnosis not present

## 2023-04-24 DIAGNOSIS — I2510 Atherosclerotic heart disease of native coronary artery without angina pectoris: Secondary | ICD-10-CM | POA: Diagnosis not present

## 2023-04-24 DIAGNOSIS — I77810 Thoracic aortic ectasia: Secondary | ICD-10-CM | POA: Diagnosis not present

## 2023-04-25 ENCOUNTER — Encounter: Payer: Self-pay | Admitting: Cardiology

## 2023-04-25 ENCOUNTER — Ambulatory Visit: Payer: Medicare Other | Attending: Cardiology | Admitting: Cardiology

## 2023-04-25 VITALS — BP 118/62 | HR 64 | Ht 72.0 in | Wt 227.6 lb

## 2023-04-25 DIAGNOSIS — I480 Paroxysmal atrial fibrillation: Secondary | ICD-10-CM

## 2023-04-25 DIAGNOSIS — I77819 Aortic ectasia, unspecified site: Secondary | ICD-10-CM | POA: Diagnosis not present

## 2023-04-25 DIAGNOSIS — K649 Unspecified hemorrhoids: Secondary | ICD-10-CM | POA: Diagnosis not present

## 2023-04-25 DIAGNOSIS — I2510 Atherosclerotic heart disease of native coronary artery without angina pectoris: Secondary | ICD-10-CM

## 2023-04-25 NOTE — Progress Notes (Signed)
Cardiology Office Note:    Date:  04/27/2023   ID:  [name], DOB 02-04-1951, MRN [id number]  PCP:  Emilio Aspen, MD  Cardiologist:  None  Electrophysiologist:  Lanier Prude, MD   Referring MD: Emilio Aspen, *   Chief Complaint  Patient presents with   Atrial Fibrillation     History of Present Illness:    [name] is a 72 y.o. male with a hx of paroxysmal atrial fibrillation, hypertension, OSA, DVT, prostate cancer, PAD, hyperlipidemia who presents for follow-up.  He has had issues with recurrent venous thromboembolism, currently on Eliquis.  He was admitted to [facility] in October 2017 with atrial fibrillation.  He converted to sinus rhythm during admission and was started on flecainide and diltiazem.  Echocardiogram at that time showed normal LV function, moderate to severe left atrial dilatation, no significant valvular disease.  He spends 6 months/year in [place] and reports that in 2020 established with a cardiologist in [place].  Stress test was done, though patient denied having any symptoms.  Stress test was abnormal and underwent cardiac catheterization which showed 60 to 70% stenosis of D1.  Medical management was recommended.  Reports that in August 2021 he had his first recurrence of atrial fibrillation since 2017.  States that he went into AF for about 1 hour with heart rate in 150s.  Converted spontaneously.  Reports that he is active, walks 2.5 miles per day.  Denies any exertional symptoms.  Does report rare resting chest pain that he describes as left-sided sharp pain, but no exertional chest pain.  Denies any lightheadedness or syncope.  Reports that he smoked for 20 years, up to 2 packs/day but quit in 2000.  No known history of heart disease in his immediate family.  Echocardiogram 09/08/2020 showed normal biventricular function, mild AI, dilatation of the ascending aorta measuring 42 mm.  CTA chest on 09/20/2020 showed mild dilatation of the  ascending aorta measuring up to 40 mm.  MRA 09/12/2021 showed stable ascending aortic dilatation measuring 40 mm.  Underwent A. fib ablation with Dr. Lalla Brothers on 11/25/2020.  Since last clinic visit, he reports that he is doing well.  Reports rare palpitations.  Denies any chest pain, dyspnea, lightheadedness, syncope, lower extremity edema.  He is taking Eliquis, denies any bleeding issues.  States that twice in the last 6 months has had small amount of blood in toilet paper.  He is using CPAP.  Exercises regularly, will tread water for an hour.  Denies any exertional symptoms.   Wt Readings from Last 3 Encounters:  04/25/23 227 lb 9.6 oz (103.2 kg)  10/10/22 229 lb 9.6 oz (104.1 kg)  05/30/22 230 lb (104.3 kg)       Past Medical History:  Diagnosis Date   Arthritis    arthritis -back   Atrial fibrillation (HCC)    a. diagnosed 09/2016 - started on Cardizem CD and Flecainide. Continue Coumadin for anticoagulation.   Cancer [facility])    cancer Prostate- surgery only   Chronic anticoagulation 09/10/2016   History of DVT (deep vein thrombosis)    History of DVT of lower extremity    left leg has some residual crculation issues   History of prostate cancer    Hyperlipidemia 09/09/2018   Hypertension    Hypertensive heart disease without CHF 09/29/2016   Obesity (BMI 30-39.9)    Paroxysmal atrial fibrillation (HCC)    CHA2DS2VASC score 2   Sleep apnea    a. uses CPAP  Sleep apnea in adult     Past Surgical History:  Procedure Laterality Date   ATRIAL FIBRILLATION ABLATION N/A 11/25/2020   Procedure: ATRIAL FIBRILLATION ABLATION;  Surgeon: Lanier Prude, MD;  Location: [facility] INVASIVE CV LAB;  Service: Cardiovascular;  Laterality: N/A;   BALLOON DILATION N/A 10/12/2015   Procedure: BALLOON DILATION;  Surgeon: Charolett Bumpers, MD;  Location: [facility] ENDOSCOPY;  Service: Endoscopy;  Laterality: N/A;   COLONOSCOPY W/ POLYPECTOMY     x2 colon polyps found   COLONOSCOPY WITH PROPOFOL N/A  10/12/2015   Procedure: COLONOSCOPY WITH PROPOFOL;  Surgeon: Charolett Bumpers, MD;  Location: [facility] ENDOSCOPY;  Service: Endoscopy;  Laterality: N/A;   ESOPHAGOGASTRODUODENOSCOPY (EGD) WITH PROPOFOL N/A 10/12/2015   Procedure: ESOPHAGOGASTRODUODENOSCOPY (EGD) WITH PROPOFOL;  Surgeon: Charolett Bumpers, MD;  Location: [facility] ENDOSCOPY;  Service: Endoscopy;  Laterality: N/A;   SHOULDER ARTHROTOMY     x2 procedures- 1 open, 1 scope.   TONSILLECTOMY     TRANSURETHRAL RESECTION OF PROSTATE      Current Medications: Current Meds  Medication Sig   apixaban (ELIQUIS) 5 MG TABS tablet Take 1 tablet (5 mg total) by mouth 2 (two) times daily.   Calcium Carbonate-Vitamin D 600-200 MG-UNIT CAPS Take 1 tablet by mouth daily with breakfast.   Cholecalciferol (VITAMIN D) 50 MCG (2000 UT) tablet Take 2,000 Units by mouth daily.   Coenzyme Q10 (COQ-10) 200 MG CAPS Take 200 mg by mouth at bedtime.   diltiazem (DILACOR XR) 180 MG 24 hr capsule Take 1 capsule (180 mg total) by mouth daily.   Evolocumab (REPATHA SURECLICK) 140 MG/ML SOAJ Inject 140 mg into the skin every 14 (fourteen) days.   FOLIC ACID PO Take 1,000 mcg by mouth daily.   lisinopril-hydrochlorothiazide (ZESTORETIC) 20-25 MG tablet Take 1 tablet by mouth daily.   MAGNESIUM CITRATE PO Take 1,000 mg by mouth in the morning.   potassium chloride SA (K-DUR,KLOR-CON) 20 MEQ tablet Take 20 mEq by mouth at bedtime.   Testosterone 20.25 MG/ACT (1.62%) GEL Apply 3 Pump topically daily.   Turmeric (QC TUMERIC COMPLEX) 500 MG CAPS Take 4 tablets by mouth daily.   vitamin B-12 (CYANOCOBALAMIN) 1000 MCG tablet Take 1,000 mcg by mouth daily.     Allergies:   Scallops [shellfish allergy], Lactose, Zonisamide, and Rosuvastatin   Social History   Socioeconomic History   Marital status: Married    Spouse name: Not on file   Number of children: Not on file   Years of education: Not on file   Highest education level: Not on file  Occupational History   Not on  file  Tobacco Use   Smoking status: Former    Years: 15    Types: Cigarettes    Quit date: 12/04/1998    Years since quitting: 24.4   Smokeless tobacco: Never  Substance and Sexual Activity   Alcohol use: No   Drug use: No   Sexual activity: Not on file  Other Topics Concern   Not on file  Social History Narrative   Not on file   Social Determinants of Health   Financial Resource Strain: Not on file  Food Insecurity: Not on file  Transportation Needs: Not on file  Physical Activity: Not on file  Stress: Not on file  Social Connections: Not on file     Family History: The patient's family history includes Hypertension in his brother and sister; Prostate cancer in his brother.  ROS:   Please see the history of  present illness.     All other systems reviewed and are negative.  EKGs/Labs/Other Studies Reviewed:    The following studies were reviewed today:  US Venous Left LE 11/30/2020: 1. No evidence of acute DVT within the left lower extremity. 2. The examination is positive for nonocclusive wall thickening/chronic DVT involving the distal aspect of the left femoral vein and the left popliteal vein, similar to remote examination performed in 2010. 3. Chronic occlusive superficial thrombophlebitis involving the proximal aspect of the left greater saphenous vein as well as the imaged portions of the left lesser saphenous vein.  EP Study/Afib Ablation 11/25/2020: CONCLUSIONS: 1. Successful PVI 2. Intracardiac echo reveals vertical heart, normal LV function, 4 PVs and trivial pericardial effusion. 3. No early apparent complications.  CT Angio Chest 09/20/2020: 1. Ascending thoracic aortic caliber 4.0 cm axial dimension. Mildly aneurysmal. Recommend annual imaging followup by CTA or MRA. This recommendation follows 2010 ACCF/AHA/AATS/ACR/ASA/SCA/SCAI/SIR/STS/SVM Guidelines for the Diagnosis and Management of Patients with Thoracic Aortic Disease. Circulation. 2010;  121: Z610-R604. Aortic aneurysm NOS (ICD10-I71.9) 2. Coronary artery disease with calcification of the LEFT coronary circulation. 3. Renal and cystic hepatic lesions. Please see prior abdominal CT for further detail regarding follow-up. 4. Aortic atherosclerosis.   Aortic Atherosclerosis (ICD10-I70.0).  Echo 09/08/2020:  1. Left ventricular ejection fraction, by estimation, is 60 to 65%. The  left ventricle has normal function. The left ventricle has no regional  wall motion abnormalities. There is mild concentric left ventricular  hypertrophy. Left ventricular diastolic  parameters were normal.   2. Right ventricular systolic function is normal. The right ventricular  size is normal. There is normal pulmonary artery systolic pressure. The  estimated right ventricular systolic pressure is 19.0 mmHg.   3. The mitral valve is grossly normal. Trivial mitral valve  regurgitation. No evidence of mitral stenosis.   4. The aortic valve is tricuspid. There is moderate calcification of the  aortic valve. There is moderate thickening of the aortic valve. Aortic  valve regurgitation is mild. Mild to moderate aortic valve  sclerosis/calcification is present, without any  evidence of aortic stenosis.   5. There is mild dilatation of the ascending aorta, measuring 42 mm.   6. The inferior vena cava is normal in size with greater than 50%  respiratory variability, suggesting right atrial pressure of 3 mmHg.   Comparison(s): Changes from prior study are noted. EF normal 60-65%.  Normal LA. Mild AI.   EKG:   04/25/23: NSR with PACs, rate 64 10/10/22: NSR with PACs, rate 65 05/30/2022: Sinus rhythm, rate 64, PACs, no ST abnormalities 05/12/2021: Sinus rhythm with frequent PACs, rate 63, no ST abnormalities 10/11/2020: EKG was not ordered.  08/25/2020: normal sinus rhythm, rate 60, no ST/T abnormalities.    Recent Labs: 05/30/2022: ALT 17; BUN 11; Creatinine, Ser 0.99; Hemoglobin 14.9; Platelets 186;  Potassium 3.9; Sodium 135  Recent Lipid Panel    Component Value Date/Time   CHOL 136 05/30/2022 1407   TRIG 67 05/30/2022 1407   HDL 65 05/30/2022 1407   CHOLHDL 2.1 05/30/2022 1407   CHOLHDL 3.4 09/09/2016 0421   VLDL 15 09/09/2016 0421   LDLCALC 57 05/30/2022 1407    Physical Exam:    VS:  BP 118/62 (BP Location: Left Arm, Patient Position: Sitting, Cuff Size: Normal)   Pulse 64   Ht 6' (1.829 m)   Wt 227 lb 9.6 oz (103.2 kg)   SpO2 96%   BMI 30.87 kg/m     Wt Readings from  Last 3 Encounters:  04/25/23 227 lb 9.6 oz (103.2 kg)  10/10/22 229 lb 9.6 oz (104.1 kg)  05/30/22 230 lb (104.3 kg)     GEN:  Well nourished, well developed in no acute distress HEENT: Normal NECK: No JVD; No carotid bruits CARDIAC: RRR, no murmurs, rubs, gallops RESPIRATORY:  Clear to auscultation without rales, wheezing or rhonchi  ABDOMEN: Soft, non-tender, non-distended MUSCULOSKELETAL:  No edema; No deformity  SKIN: Warm and dry NEUROLOGIC:  Alert and oriented x 3 PSYCHIATRIC:  Normal affect   ASSESSMENT:    1. Paroxysmal atrial fibrillation (HCC)   2. Hemorrhoids, unspecified hemorrhoid type   3. Coronary artery disease involving native coronary artery of native heart without angina pectoris   4. Aortic dilatation (HCC)      PLAN:    Paroxysmal atrial fibrillation/flutter: CHA2DS2-VASc score 3 (hypertension, age, PAD).  Echocardiogram 09/08/2020 showed normal biventricular function, no significant valvular disease.  Underwent ablation with Dr. Lalla Brothers on 11/25/2020.  Appears to be maintaining sinus rhythm. -Continue Eliquis 5 mg twice daily -Continue diltiazem 180 mg daily  CAD: Catheterization in [place] in 2021 showed 60 to 70% D1 stenosis.  Managing medically.  Currently denies any chest pain -Continue Eliquis  -Continue Repatha  Aortic dilatation: Ascending aorta measures 40 mm on CTA 09/20/2020.  MRA 09/12/2021 showed stable ascending aortic dilatation measuring 40  mm -Check echo to monitor aortic dilatation  Hypertension: On lisinopril-hydrochlorothiazide 20-12.5 mg daily and diltiazem 180 mg daily.  Appears controlled.    PAD: Continue Eliquis and statin  Hyperlipidemia: On atorvastatin 10 mg daily, LDL 57 on 05/30/2022.  However had to stop atorvastatin due to myalgias.  Started rosuvastatin 5 mg daily but unable to tolerate.  Referred to pharmacy lipid clinic and started on Repatha.  LDL 41 on 03/08/2023  DVT: History of recurrent venous thromboembolism.  Continue Eliquis.    OSA: Continue CPAP.  Reports compliance.   Prediabetes: A1c 5.7% on 05/30/2022  Bone lesion: MRI showed abnormal enhancement in ninth rib.  Given history of prostate cancer, could be concerning for osseous metastatic disease.  PSA undetectable.  Bone scan showed focal activity at bilateral ninth costovertebral junctions, felt to be degenerative or posttraumatic   Hemorrhoids: reports occasional bleeding.  Will refer to GI for evaluation  RTC in 6 months  Medication Adjustments/Labs and Tests Ordered: Current medicines are reviewed at length with the patient today.  Concerns regarding medicines are outlined above.  Orders Placed This Encounter  Procedures   Ambulatory referral to Gastroenterology   EKG 12-Lead   ECHOCARDIOGRAM COMPLETE    No orders of the defined types were placed in this encounter.    Patient Instructions  Medication Instructions:  Your physician recommends that you continue on your current medications as directed. Please refer to the Current Medication list given to you today.   *If you need a refill on your cardiac medications before your next appointment, please call your pharmacy*  Testing: Your physician has requested that you have an echocardiogram in 5 MONTHS. Echocardiography is a painless test that uses sound waves to create images of your heart. It provides your doctor with information about the size and shape of your heart and how well  your heart's chambers and valves are working. This procedure takes approximately one hour. There are no restrictions for this procedure. Please do NOT wear cologne, perfume, aftershave, or lotions (deodorant is allowed). Please arrive 15 minutes prior to your appointment time.   Follow-Up: At [facility],  you and your health needs are our priority.  As part of our continuing mission to provide you with exceptional heart care, we have created designated Provider Care Teams.  These Care Teams include your primary Cardiologist (physician) and Advanced Practice Providers (APPs -  Physician Assistants and Nurse Practitioners) who all work together to provide you with the care you need, when you need it.  We recommend signing up for the patient portal called "MyChart".  Sign up information is provided on this After Visit Summary.  MyChart is used to connect with patients for Virtual Visits (Telemedicine).  Patients are able to view lab/test results, encounter notes, upcoming appointments, etc.  Non-urgent messages can be sent to your provider as well.   To learn more about what you can do with MyChart, go to [email or web address].    Your next appointment:   5 month(s)  Provider:   Dr. Bjorn Pippin Other Instructions You have been referred to: Gastroenterology        Signed, Little Ishikawa, MD  04/27/2023 11:29 AM    [facility]

## 2023-04-25 NOTE — Patient Instructions (Addendum)
Medication Instructions:  Your physician recommends that you continue on your current medications as directed. Please refer to the Current Medication list given to you today.   *If you need a refill on your cardiac medications before your next appointment, please call your pharmacy*  Testing: Your physician has requested that you have an echocardiogram in 5 MONTHS. Echocardiography is a painless test that uses sound waves to create images of your heart. It provides your doctor with information about the size and shape of your heart and how well your heart's chambers and valves are working. This procedure takes approximately one hour. There are no restrictions for this procedure. Please do NOT wear cologne, perfume, aftershave, or lotions (deodorant is allowed). Please arrive 15 minutes prior to your appointment time.   Follow-Up: At [facility], you and your health needs are our priority.  As part of our continuing mission to provide you with exceptional heart care, we have created designated Provider Care Teams.  These Care Teams include your primary Cardiologist (physician) and Advanced Practice Providers (APPs -  Physician Assistants and Nurse Practitioners) who all work together to provide you with the care you need, when you need it.  We recommend signing up for the patient portal called "MyChart".  Sign up information is provided on this After Visit Summary.  MyChart is used to connect with patients for Virtual Visits (Telemedicine).  Patients are able to view lab/test results, encounter notes, upcoming appointments, etc.  Non-urgent messages can be sent to your provider as well.   To learn more about what you can do with MyChart, go to [email or web address].    Your next appointment:   5 month(s)  Provider:   Dr. Bjorn Pippin Other Instructions You have been referred to: Gastroenterology

## 2023-05-04 DIAGNOSIS — I700 Atherosclerosis of aorta: Secondary | ICD-10-CM | POA: Diagnosis not present

## 2023-05-04 DIAGNOSIS — I480 Paroxysmal atrial fibrillation: Secondary | ICD-10-CM | POA: Diagnosis not present

## 2023-05-04 DIAGNOSIS — D6869 Other thrombophilia: Secondary | ICD-10-CM | POA: Diagnosis not present

## 2023-05-04 DIAGNOSIS — Z125 Encounter for screening for malignant neoplasm of prostate: Secondary | ICD-10-CM | POA: Diagnosis not present

## 2023-05-04 DIAGNOSIS — Z122 Encounter for screening for malignant neoplasm of respiratory organs: Secondary | ICD-10-CM | POA: Diagnosis not present

## 2023-05-04 DIAGNOSIS — R7309 Other abnormal glucose: Secondary | ICD-10-CM | POA: Diagnosis not present

## 2023-05-04 DIAGNOSIS — E291 Testicular hypofunction: Secondary | ICD-10-CM | POA: Diagnosis not present

## 2023-05-04 DIAGNOSIS — I10 Essential (primary) hypertension: Secondary | ICD-10-CM | POA: Diagnosis not present

## 2023-05-04 DIAGNOSIS — M48061 Spinal stenosis, lumbar region without neurogenic claudication: Secondary | ICD-10-CM | POA: Diagnosis not present

## 2023-05-04 DIAGNOSIS — I2510 Atherosclerotic heart disease of native coronary artery without angina pectoris: Secondary | ICD-10-CM | POA: Diagnosis not present

## 2023-05-04 DIAGNOSIS — G4733 Obstructive sleep apnea (adult) (pediatric): Secondary | ICD-10-CM | POA: Diagnosis not present

## 2023-05-04 DIAGNOSIS — J329 Chronic sinusitis, unspecified: Secondary | ICD-10-CM | POA: Diagnosis not present

## 2023-05-09 DIAGNOSIS — K625 Hemorrhage of anus and rectum: Secondary | ICD-10-CM | POA: Diagnosis not present

## 2023-05-09 DIAGNOSIS — Z86718 Personal history of other venous thrombosis and embolism: Secondary | ICD-10-CM | POA: Diagnosis not present

## 2023-05-09 DIAGNOSIS — Z8679 Personal history of other diseases of the circulatory system: Secondary | ICD-10-CM | POA: Diagnosis not present

## 2023-05-09 DIAGNOSIS — R1319 Other dysphagia: Secondary | ICD-10-CM | POA: Diagnosis not present

## 2023-05-15 DIAGNOSIS — Z85828 Personal history of other malignant neoplasm of skin: Secondary | ICD-10-CM | POA: Diagnosis not present

## 2023-05-15 DIAGNOSIS — D2262 Melanocytic nevi of left upper limb, including shoulder: Secondary | ICD-10-CM | POA: Diagnosis not present

## 2023-05-15 DIAGNOSIS — L821 Other seborrheic keratosis: Secondary | ICD-10-CM | POA: Diagnosis not present

## 2023-05-15 DIAGNOSIS — L720 Epidermal cyst: Secondary | ICD-10-CM | POA: Diagnosis not present

## 2023-05-15 DIAGNOSIS — L570 Actinic keratosis: Secondary | ICD-10-CM | POA: Diagnosis not present

## 2023-05-15 DIAGNOSIS — D225 Melanocytic nevi of trunk: Secondary | ICD-10-CM | POA: Diagnosis not present

## 2023-05-15 DIAGNOSIS — D485 Neoplasm of uncertain behavior of skin: Secondary | ICD-10-CM | POA: Diagnosis not present

## 2023-05-15 DIAGNOSIS — D1801 Hemangioma of skin and subcutaneous tissue: Secondary | ICD-10-CM | POA: Diagnosis not present

## 2023-05-15 DIAGNOSIS — D2261 Melanocytic nevi of right upper limb, including shoulder: Secondary | ICD-10-CM | POA: Diagnosis not present

## 2023-05-15 DIAGNOSIS — L308 Other specified dermatitis: Secondary | ICD-10-CM | POA: Diagnosis not present

## 2023-05-15 DIAGNOSIS — L814 Other melanin hyperpigmentation: Secondary | ICD-10-CM | POA: Diagnosis not present

## 2023-05-30 DIAGNOSIS — D120 Benign neoplasm of cecum: Secondary | ICD-10-CM | POA: Diagnosis not present

## 2023-05-30 DIAGNOSIS — K625 Hemorrhage of anus and rectum: Secondary | ICD-10-CM | POA: Diagnosis not present

## 2023-05-30 DIAGNOSIS — K317 Polyp of stomach and duodenum: Secondary | ICD-10-CM | POA: Diagnosis not present

## 2023-05-30 DIAGNOSIS — K3189 Other diseases of stomach and duodenum: Secondary | ICD-10-CM | POA: Diagnosis not present

## 2023-05-30 DIAGNOSIS — K5730 Diverticulosis of large intestine without perforation or abscess without bleeding: Secondary | ICD-10-CM | POA: Diagnosis not present

## 2023-05-30 DIAGNOSIS — R1310 Dysphagia, unspecified: Secondary | ICD-10-CM | POA: Diagnosis not present

## 2023-05-30 DIAGNOSIS — K222 Esophageal obstruction: Secondary | ICD-10-CM | POA: Diagnosis not present

## 2023-05-30 DIAGNOSIS — K6389 Other specified diseases of intestine: Secondary | ICD-10-CM | POA: Diagnosis not present

## 2023-06-01 DIAGNOSIS — H9313 Tinnitus, bilateral: Secondary | ICD-10-CM | POA: Insufficient documentation

## 2023-06-01 DIAGNOSIS — H938X3 Other specified disorders of ear, bilateral: Secondary | ICD-10-CM | POA: Diagnosis not present

## 2023-06-07 DIAGNOSIS — D120 Benign neoplasm of cecum: Secondary | ICD-10-CM | POA: Diagnosis not present

## 2023-06-25 DIAGNOSIS — N281 Cyst of kidney, acquired: Secondary | ICD-10-CM | POA: Diagnosis not present

## 2023-06-25 DIAGNOSIS — K862 Cyst of pancreas: Secondary | ICD-10-CM | POA: Diagnosis not present

## 2023-06-25 DIAGNOSIS — C61 Malignant neoplasm of prostate: Secondary | ICD-10-CM | POA: Diagnosis not present

## 2023-07-08 ENCOUNTER — Encounter: Payer: Self-pay | Admitting: Cardiology

## 2023-07-24 IMAGING — MR MR MRA CHEST W/ OR W/O CM
18 series · 18 of 18 positions shown · IV contrast (gadavist)
Comparison: Prior CT scan of the chest 09/20/2020; CT scan of the
abdomen and pelvis 06/08/2020

CLINICAL DATA: Aortic aneurysm follow-up

EXAM:
MRA CHEST WITH OR WITHOUT CONTRAST
TECHNIQUE: Angiographic images of the chest were obtained using MRA technique
without and with intravenous contrast.
CONTRAST:  10mL GADAVIST GADOBUTROL 1 MMOL/ML IV SOLN

[Series 3: t2_trufi_tra_p2_bh · axial · 8.0mm · 0.62mm/px · 1 of 24 slices shown]
[im 1/24]
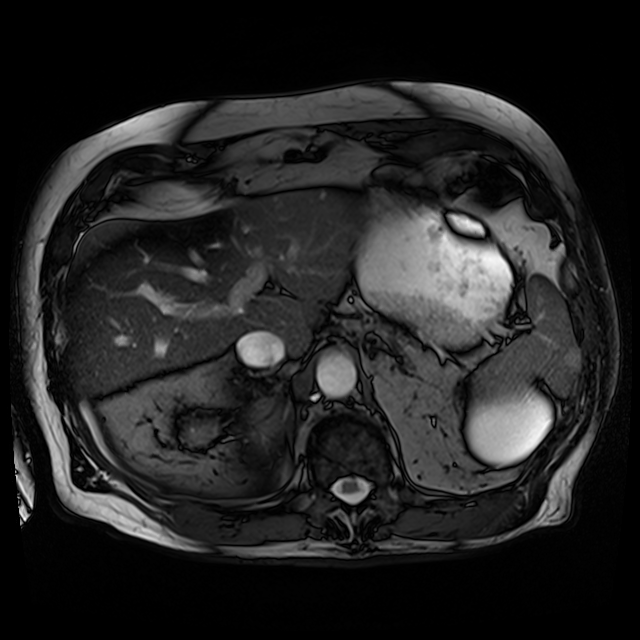

[Series 5: axial_db_haste_loc · axial · 8.0mm · 1.48mm/px · 1 of 24 slices shown]
[im 1/24]
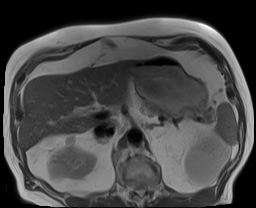

[Series 6: (person_name)_(person_name)_(person_name) · oblique · 8.0mm · 1.79mm/px · 1 of 24 slices shown (1 of 7)]
[im 1/24]
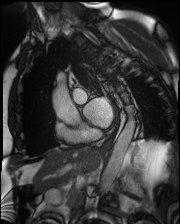

[Series 6: (person_name)_(person_name)_(person_name) · oblique · 8.0mm · 1.79mm/px · 1 of 24 slices shown (2 of 7)]
[im 1/24]
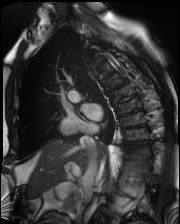

[Series 6: (person_name)_(person_name)_(person_name) · oblique · 8.0mm · 1.79mm/px · 1 of 24 slices shown (3 of 7)]
[im 1/24]
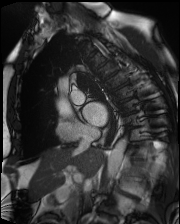

[Series 6: (person_name)_(person_name)_(person_name) · oblique · 8.0mm · 1.79mm/px · 1 of 24 slices shown (4 of 7)]
[im 1/24]
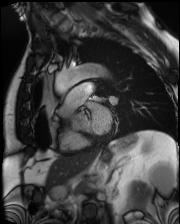

[Series 6: (person_name)_(person_name)_(person_name) · oblique · 8.0mm · 1.79mm/px · 1 of 24 slices shown (5 of 7)]
[im 1/24]
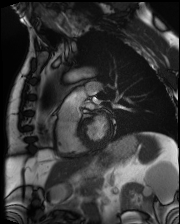

[Series 6: (person_name)_(person_name)_(person_name) · oblique · 8.0mm · 1.79mm/px · 1 of 24 slices shown (6 of 7)]
[im 1/24]
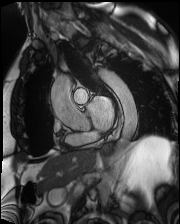

[Series 6: (person_name)_(person_name)_(person_name) · oblique · 8.0mm · 1.79mm/px · 1 of 24 slices shown (7 of 7)]
[im 1/24]
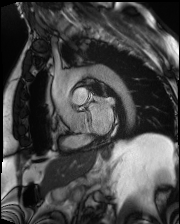

[Series 7: T1 dynamic · axial · non-contrast · 3.3mm · 1.18mm/px · 1 of 80 slices shown]
[im 1/80]
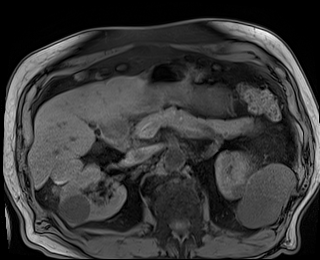

[Series 8: angio_fl3d_sag_pre · oblique · 1.1mm · 1.17mm/px · 1 of 112 slices shown]
[im 1/112]
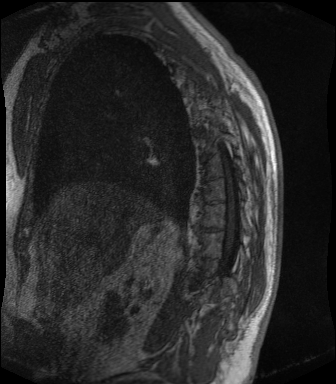

[Series 9: care_bolus_sag · oblique · 20.0mm · 1.56mm/px · 1 of 35 slices shown]
[im 1/35]
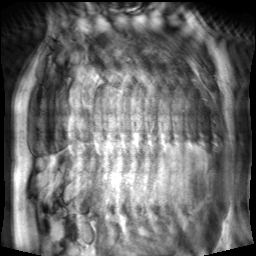

[Series 10: candy cane ce-arterial · oblique · arterial · 1.1mm · 1.17mm/px · 1 of 112 slices shown]
[im 1/112]
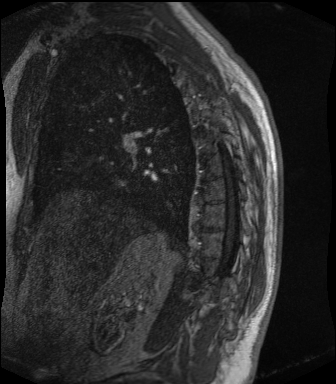

[Series 11: candy cane ce-arterial_sub · oblique · 1.1mm · 1.17mm/px · 1 of 112 slices shown]
[im 1/112]
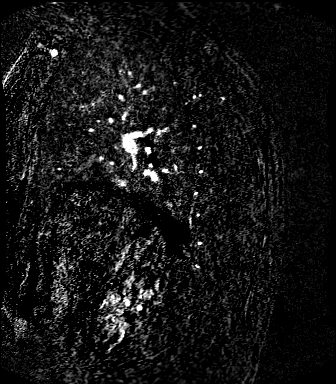

[Series 13: candy cane ce-venous · oblique · portal-venous · 1.1mm · 1.17mm/px · 1 of 112 slices shown]
[im 1/112]
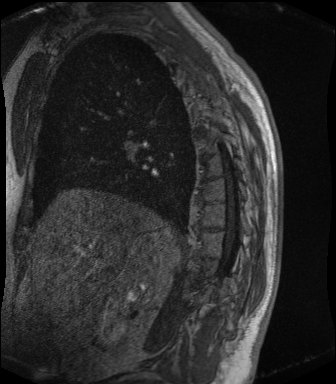

[Series 14: candy cane ce-venous_sub · oblique · 1.1mm · 1.17mm/px · 1 of 112 slices shown]
[im 1/112]
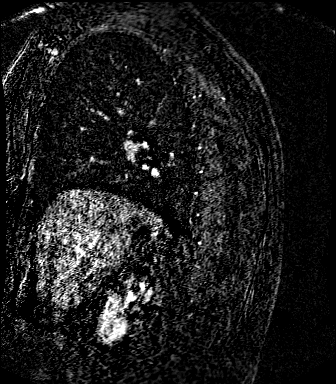

[Series 16: T1 dynamic post-contrast · axial · 3.3mm · 1.18mm/px · 1 of 80 slices shown]
[im 1/80]
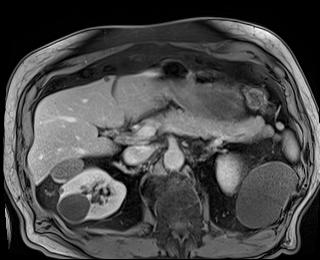

[Series 1027: mip tumble · axial · 1.1mm · 0.32mm/px · 1 of 1 slices shown]
[im 1/1]
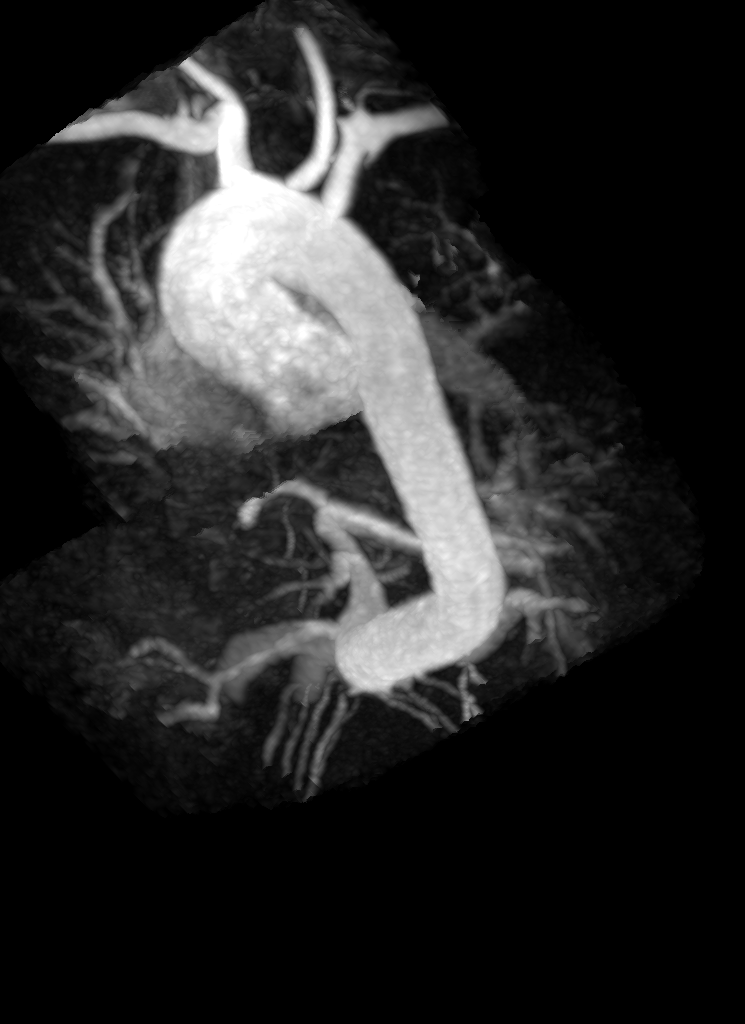

[18 of 18 positions shown; findings below may reference images not displayed]

FINDINGS: VASCULAR

Stable mild fusiform aneurysmal dilation of the ascending thoracic
aorta with a maximal transverse diameter of 4.0 cm. Conventional 3
vessel arch anatomy. The heart is normal in size. No pericardial
effusion. Unremarkable main and central pulmonary artery.

NON-VASCULAR

Lungs/pleura: No focal signal abnormality or abnormal enhancement.
No evidence of pleural effusion.

Mediastinum: Unremarkable appearance of the mediastinum. No
adenopathy or mass. Unremarkable esophagus.

Visualized upper abdomen: Multifocal circumscribed T2 hyperintense
nonenhancing lesions scattered throughout the liver consistent with
simple cysts. Additionally, there are numerous cystic lesions
emanating from the kidneys which are incompletely imaged and
evaluated. Correlation with prior CT imaging from Wednesday June, 2020
demonstrates a similar distribution and size of the lesions. These
almost certainly represent benign cysts.

Bones: Abnormal enhancement of the head and neck of the right ninth
rib (image 40 series 16).

Other: None.
IMPRESSION: VASCULAR

1. Stable mild fusiform aneurysmal dilation of the ascending
thoracic aorta with a maximal diameter of 4 cm. Recommend annual
imaging followup by CTA or MRA. This recommendation follows 0979
ACCF/AHA/AATS/ACR/ASA/SCA/RUSLAN/PRIYADARSANI/HO TIN/PORQUET Guidelines for the
Diagnosis and Management of Patients with Thoracic Aortic Disease.
Circulation. 0979; 121: [id number]. Aortic aneurysm NOS (0981D-Z7X.Q)

NON-VASCULAR

1. Abnormal enhancement in the head and neck of the right ninth rib.
Given the patient's clinical history of prostate cancer, findings
are concerning for possible osseous metastatic disease. Recommend
correlation with serum PSA. Additionally, nuclear medicine bone scan
could be considered to further evaluate for underlying osseous
metastases.
2. Multifocal probable hepatic and renal cysts.

These results will be called to the ordering clinician or
representative by the Radiologist Assistant, and communication
documented in the PACS or [REDACTED].

## 2023-07-25 DIAGNOSIS — K5900 Constipation, unspecified: Secondary | ICD-10-CM | POA: Diagnosis not present

## 2023-07-25 DIAGNOSIS — I480 Paroxysmal atrial fibrillation: Secondary | ICD-10-CM | POA: Diagnosis not present

## 2023-07-25 DIAGNOSIS — K648 Other hemorrhoids: Secondary | ICD-10-CM | POA: Diagnosis not present

## 2023-08-01 DIAGNOSIS — R0982 Postnasal drip: Secondary | ICD-10-CM | POA: Diagnosis not present

## 2023-08-01 DIAGNOSIS — H938X3 Other specified disorders of ear, bilateral: Secondary | ICD-10-CM | POA: Diagnosis not present

## 2023-08-07 ENCOUNTER — Other Ambulatory Visit (HOSPITAL_COMMUNITY): Payer: Self-pay

## 2023-08-07 DIAGNOSIS — Z23 Encounter for immunization: Secondary | ICD-10-CM | POA: Diagnosis not present

## 2023-08-07 NOTE — Progress Notes (Unsigned)
New Patient Note  RE: [name] MRN: [id number] DOB: 1951/03/02 Date of Office Visit: 08/08/2023  Consult requested by: Vivianne Master, PA Primary care provider: Emilio Aspen, MD  Chief Complaint: Nasal Polyps (Nasal and ear problem), Allergy Testing, and Establish Care  History of Present Illness: I had the pleasure of seeing [name] for initial evaluation at the [facility] on 08/09/2023. He is a 72 y.o. male, who is referred here by [name], PA (ENT) for the evaluation of PND.  Patient usually spends his winter in [place] and he thought he had an infection/head cold. Took antibiotics with no benefit at that time.  He then hand another course of antibiotics/prednisone which helped a little bit - the cough resolved but still having PND and ear fullness.   He reports symptoms of PND, ear fullness. Symptoms have been going on for 10 months. The symptoms are present all year around. Other triggers include exposure to unknown. Anosmia: no. Headache: sometimes. He has used Flonase, Careers adviser with minimal improvement in symptoms. Sinus infections: possibly. Previous work up includes: none. Previous ENT evaluation: yes - unremarkable exam and hearing test normal.  Previous sinus imaging: no. History of nasal polyps: no. Last eye exam: 2024. History of reflux: rarely - not on daily meds for this. Takes tums prn with good benefit.   CT chest 04/24/2023: "1. No active pulmonary disease. No lung masses or significant pulmonary nodules. 2. Dilated 4.2 cm ascending thoracic aorta. Recommend annual imaging followup by CTA or MRA. "  06/01/2023 ENT visit: "1) Ear pressure-bilateral 2) eustachian tube dysfunction 3) tinnitus  Continue fluticasone nasal spray and Allegra I recommended that he have a hearing evaluation. Return as needed"  Assessment and Plan: Kedric is a 72 y.o. male with: Other allergic rhinitis PND (post-nasal  drip) Dysfunction of both eustachian tubes Started with a URI 10 months ago but having now daily PND and ear fullness. ENT/hearing eval unremarkable. No prior allergy testing. H/o allergic rhinitis symptoms.  Today's skin testing negative to indoor/outdoor allergens.  Use Flonase (fluticasone) nasal spray 1-2 sprays per nostril once a day as needed for nasal congestion.  Use Atrovent (ipratropium) 0.06% 1-2 sprays per nostril three times a day as needed for runny nose/drainage. Nasal saline spray (i.e., Simply Saline) or nasal saline lavage (i.e., NeilMed) is recommended as needed and prior to medicated nasal sprays. Stop antihistamines as ineffective.  Keep ENT appointment.   Other adverse food reactions, not elsewhere classified, subsequent encounter Nausea/vomiting with scallops. Tolerates finned fish, lobster and shrimp. Today's skin testing negative to scallops and oysters. Continue to avoid mollusks.  If interested in reintroduction - need bloodwork next.   Return if symptoms worsen or fail to improve.  No orders of the defined types were placed in this encounter.  Lab Orders  No laboratory test(s) ordered today    Other allergy screening: Asthma: no Food allergy: yes Scallops - nausea/vomiting. Tolerates other seafood (fish, lobster, shrimp) with no issues.  Medication allergy: no Hymenoptera allergy: no Urticaria: no Eczema:no History of recurrent infections suggestive of immunodeficency: no  Diagnostics: Skin Testing: Environmental allergy panel. Negative to indoor/outdoor allergens, scallops and oysters.  Results discussed with patient/family.  Airborne Adult Perc - 08/08/23 0923     Time Antigen Placed 4098    Allergen Manufacturer Waynette Buttery    Location Back    Number of Test 53    1. Control-Buffer 50% Glycerol Negative    2. Control-Histamine 2+  3. Bahia Negative    4. French Southern Territories Negative    5. Johnson Negative    6. Kentucky Blue Negative    7. Meadow  Fescue Negative    8. Perennial Rye Negative    9. Timothy Negative    10. Ragweed Mix Negative    11. Cocklebur Negative    12. Plantain,  English Negative    13. Baccharis Negative    14. Dog Fennel Negative    15. Russian Thistle Negative    16. Lamb's Quarters Negative    17. Sheep Sorrell Negative    18. Rough Pigweed Negative    19. Marsh Elder, Rough Negative    20. Mugwort, Common Negative    21. Box, Elder Negative    22. Cedar, red Negative    23. Sweet Gum Negative    24. Pecan Pollen Negative    25. Pine Mix Negative    26. Walnut, Black Pollen Negative    27. Red Mulberry Negative    28. Ash Mix Negative    29. Birch Mix Negative    30. Beech American Negative    31. Cottonwood, Guinea-Bissau Negative    32. Hickory, White Negative    33. Maple Mix Negative    34. Oak, Guinea-Bissau Mix Negative    35. Sycamore Eastern Negative    36. Alternaria Alternata Negative    37. Cladosporium Herbarum Negative    38. Aspergillus Mix Negative    39. Penicillium Mix Negative    40. Bipolaris Sorokiniana (Helminthosporium) Negative    41. Drechslera Spicifera (Curvularia) Negative    42. Mucor Plumbeus Negative    43. Fusarium Moniliforme Negative    44. Aureobasidium Pullulans (pullulara) Negative    45. Rhizopus Oryzae Negative    46. Botrytis Cinera Negative    47. Epicoccum Nigrum Negative    48. Phoma Betae Negative    49. Dust Mite Mix Negative    50. Cat Hair 10,000 BAU/ml Negative    51.  Dog Epithelia Negative    52. Mixed Feathers Negative    53. Horse Epithelia Negative    54. Cockroach, German Negative    55. Tobacco Leaf Negative             Intradermal - 08/08/23 1017     Time Antigen Placed 1017    Allergen Manufacturer Greer    Location Arm    Number of Test 15    Control Negative    Bahia Negative    French Southern Territories Negative    Johnson Negative    7 Grass Negative    Ragweed Mix Negative    Weed Mix Negative    Tree Mix Negative    Mold 1 Negative     Mold 2 Negative    Mold 3 Negative    Mold 4 Negative    Mite Mix Negative    Cat Negative    Dog Negative    Cockroach Negative             Food Adult Perc - 08/08/23 0900     Time Antigen Placed 1478    Allergen Manufacturer Waynette Buttery    Location Back    Number of allergen test 2    Control-Histamine 2+    26. Oyster Negative    27. Scallops Negative             Past Medical History: Patient Active Problem List   Diagnosis Date Noted   Secondary hypercoagulable state (HCC) 09/03/2020  High risk medication use 09/17/2018   Hyperlipidemia 09/09/2018   Hypertensive heart disease without CHF 09/29/2016   Obesity (BMI 30-39.9)    History of DVT (deep vein thrombosis)    Sleep apnea in adult    History of prostate cancer    Chronic anticoagulation 09/10/2016   Paroxysmal atrial fibrillation (HCC)    Past Medical History:  Diagnosis Date   Arthritis    arthritis -back   Atrial fibrillation (HCC)    a. diagnosed 09/2016 - started on Cardizem CD and Flecainide. Continue Coumadin for anticoagulation.   Cancer [facility])    cancer Prostate- surgery only   Chronic anticoagulation 09/10/2016   History of DVT (deep vein thrombosis)    History of DVT of lower extremity    left leg has some residual crculation issues   History of prostate cancer    Hyperlipidemia 09/09/2018   Hypertension    Hypertensive heart disease without CHF 09/29/2016   Obesity (BMI 30-39.9)    Paroxysmal atrial fibrillation (HCC)    CHA2DS2VASC score 2   Sleep apnea    a. uses CPAP   Sleep apnea in adult    Past Surgical History: Past Surgical History:  Procedure Laterality Date   ATRIAL FIBRILLATION ABLATION N/A 11/25/2020   Procedure: ATRIAL FIBRILLATION ABLATION;  Surgeon: Lanier Prude, MD;  Location: [facility] INVASIVE [facility];  Service: Cardiovascular;  Laterality: N/A;   BALLOON DILATION N/A 10/12/2015   Procedure: BALLOON DILATION;  Surgeon: Charolett Bumpers, MD;  Location: [facility] ENDOSCOPY;   Service: Endoscopy;  Laterality: N/A;   COLONOSCOPY W/ POLYPECTOMY     x2 colon polyps found   COLONOSCOPY WITH PROPOFOL N/A 10/12/2015   Procedure: COLONOSCOPY WITH PROPOFOL;  Surgeon: Charolett Bumpers, MD;  Location: [facility] ENDOSCOPY;  Service: Endoscopy;  Laterality: N/A;   ESOPHAGOGASTRODUODENOSCOPY (EGD) WITH PROPOFOL N/A 10/12/2015   Procedure: ESOPHAGOGASTRODUODENOSCOPY (EGD) WITH PROPOFOL;  Surgeon: Charolett Bumpers, MD;  Location: [facility] ENDOSCOPY;  Service: Endoscopy;  Laterality: N/A;   SHOULDER ARTHROTOMY     x2 procedures- 1 open, 1 scope.   TONSILLECTOMY     TRANSURETHRAL RESECTION OF PROSTATE     Medication List:  Current Outpatient Medications  Medication Sig Dispense Refill   apixaban (ELIQUIS) 5 MG TABS tablet Take 1 tablet (5 mg total) by mouth 2 (two) times daily. 60 tablet    Calcium Carbonate-Vitamin D 600-200 MG-UNIT CAPS Take 1 tablet by mouth daily with breakfast.     Cholecalciferol (VITAMIN D) 50 MCG (2000 UT) tablet Take 2,000 Units by mouth daily.     Coenzyme Q10 (COQ-10) 200 MG CAPS Take 200 mg by mouth at bedtime.     diltiazem (DILACOR XR) 180 MG 24 hr capsule Take 1 capsule (180 mg total) by mouth daily. 90 capsule 3   Evolocumab (REPATHA SURECLICK) 140 MG/ML SOAJ Inject 140 mg into the skin every 14 (fourteen) days. 2 mL 11   FOLIC ACID PO Take 1,000 mcg by mouth daily.     hydrocortisone 2.5 % cream Apply topically 2 (two) times daily.     ipratropium (ATROVENT) 0.06 % nasal spray 1 to 2 sprays in each nostril 3 times a day     lisinopril-hydrochlorothiazide (ZESTORETIC) 20-25 MG tablet Take 1 tablet by mouth daily.     MAGNESIUM CITRATE PO Take 1,000 mg by mouth in the morning.     potassium chloride SA (K-DUR,KLOR-CON) 20 MEQ tablet Take 20 mEq by mouth at bedtime.     Testosterone 20.25  MG/ACT (1.62%) GEL Apply 3 Pump topically daily.  1   triamcinolone cream (KENALOG) 0.1 % Apply topically.     triamcinolone ointment (KENALOG) 0.1 % APPLY A SMALL AMOUNT/  THIN LAYER TO AFFECTED AREAS ON THE SKIN TWICE A DAY FOR FOR 1 TO 3 WEEKS AS NEEDED FOR INFLAMMATION.     Turmeric (QC TUMERIC COMPLEX) 500 MG CAPS Take 4 tablets by mouth daily.     vitamin B-12 (CYANOCOBALAMIN) 1000 MCG tablet Take 1,000 mcg by mouth daily.     No current facility-administered medications for this visit.   Allergies: Allergies  Allergen Reactions   Shellfish Allergy Diarrhea and Nausea And Vomiting   Atorvastatin Other (See Comments)   Lactose Diarrhea   Zonisamide Other (See Comments)    Dizziness   Rosuvastatin Other (See Comments)    Muscle aches   Social History: Social History   Socioeconomic History   Marital status: Married    Spouse name: Not on file   Number of children: Not on file   Years of education: Not on file   Highest education level: Not on file  Occupational History   Not on file  Tobacco Use   Smoking status: Former    Current packs/day: 0.00    Types: Cigarettes    Start date: 12/05/1983    Quit date: 12/04/1998    Years since quitting: 24.6   Smokeless tobacco: Never  Vaping Use   Vaping status: Never Used  Substance and Sexual Activity   Alcohol use: No   Drug use: No   Sexual activity: Not on file  Other Topics Concern   Not on file  Social History Narrative   Not on file   Social Determinants of Health   Financial Resource Strain: Not on file  Food Insecurity: Not on file  Transportation Needs: Not on file  Physical Activity: Not on file  Stress: Not on file  Social Connections: Not on file   Lives in a house which is 73 years old. Smoking: quit in 2000 Occupation: retired  Landscape architect History: Immunologist in the house: no Engineer, civil (consulting) in the family room: no Carpet in the bedroom: yes Heating: gas Cooling: central Pet: no  Family History: Family History  Problem Relation Age of Onset   Allergic rhinitis Sister    Hypertension Sister    Allergic rhinitis Brother    Prostate cancer Brother     Hypertension Brother    Review of Systems  Constitutional:  Negative for appetite change, chills, fever and unexpected weight change.  HENT:  Positive for postnasal drip. Negative for congestion and rhinorrhea.        Ear fullness  Eyes:  Negative for itching.  Respiratory:  Negative for cough, chest tightness, shortness of breath and wheezing.   Cardiovascular:  Negative for chest pain.  Gastrointestinal:  Negative for abdominal pain.  Genitourinary:  Negative for difficulty urinating.  Skin:  Negative for rash.  Neurological:  Negative for headaches.    Objective: BP 124/70   Pulse (!) 52   Temp 97.9 F (36.6 C) (Temporal)   Ht 5' 9.29" (1.76 m)   Wt 228 lb (103.4 kg)   SpO2 97%   BMI 33.39 kg/m  Body mass index is 33.39 kg/m. Physical Exam Vitals and nursing note reviewed.  Constitutional:      Appearance: Normal appearance. He is well-developed.  HENT:     Head: Normocephalic and atraumatic.     Right Ear: Tympanic membrane and external ear  normal.     Left Ear: Tympanic membrane and external ear normal.     Nose: Nose normal.     Mouth/Throat:     Mouth: Mucous membranes are moist.     Pharynx: Oropharynx is clear.  Eyes:     Conjunctiva/sclera: Conjunctivae normal.  Cardiovascular:     Rate and Rhythm: Normal rate and regular rhythm.     Heart sounds: Normal heart sounds. No murmur heard.    No friction rub. No gallop.  Pulmonary:     Effort: Pulmonary effort is normal.     Breath sounds: Normal breath sounds. No wheezing, rhonchi or rales.  Musculoskeletal:     Cervical back: Neck supple.  Skin:    General: Skin is warm.     Findings: No rash.  Neurological:     Mental Status: He is alert and oriented to person, place, and time.  Psychiatric:        Behavior: Behavior normal.   The plan was reviewed with the patient/family, and all questions/concerned were addressed.  It was my pleasure to see Montrey today and participate in his care. Please feel  free to contact me with any questions or concerns.  Sincerely,  Wyline Mood, DO Allergy & Immunology  [facility] office: [phone] [facility] office: [phone]

## 2023-08-08 ENCOUNTER — Encounter: Payer: Self-pay | Admitting: Allergy

## 2023-08-08 ENCOUNTER — Ambulatory Visit (INDEPENDENT_AMBULATORY_CARE_PROVIDER_SITE_OTHER): Payer: Medicare Other | Admitting: Allergy

## 2023-08-08 ENCOUNTER — Other Ambulatory Visit: Payer: Self-pay

## 2023-08-08 VITALS — BP 124/70 | HR 52 | Temp 97.9°F | Ht 69.29 in | Wt 228.0 lb

## 2023-08-08 DIAGNOSIS — T781XXD Other adverse food reactions, not elsewhere classified, subsequent encounter: Secondary | ICD-10-CM | POA: Diagnosis not present

## 2023-08-08 DIAGNOSIS — H6993 Unspecified Eustachian tube disorder, bilateral: Secondary | ICD-10-CM | POA: Diagnosis not present

## 2023-08-08 DIAGNOSIS — J3089 Other allergic rhinitis: Secondary | ICD-10-CM

## 2023-08-08 DIAGNOSIS — J310 Chronic rhinitis: Secondary | ICD-10-CM

## 2023-08-08 DIAGNOSIS — R0982 Postnasal drip: Secondary | ICD-10-CM

## 2023-08-08 NOTE — Patient Instructions (Addendum)
Today's skin testing:  Negative to indoor/outdoor allergens, scallops and oysters.   Results given.  Rhinitis  Eustachian tube dysfunction Use Flonase (fluticasone) nasal spray 1-2 sprays per nostril once a day as needed for nasal congestion.  Use Atrovent (ipratropium) 0.06% 1-2 sprays per nostril three times a day as needed for runny nose/drainage. Nasal saline spray (i.e., Simply Saline) or nasal saline lavage (i.e., NeilMed) is recommended as needed and prior to medicated nasal sprays. Stop antihistamines as ineffective.  Keep ENT appointment.   Food Continue to avoid mollusks - such as scallops.  If interested in reintroduction - need bloodwork next.   Return if symptoms worsen or fail to improve.

## 2023-08-09 ENCOUNTER — Encounter: Payer: Self-pay | Admitting: Allergy

## 2023-09-12 DIAGNOSIS — R0982 Postnasal drip: Secondary | ICD-10-CM | POA: Diagnosis not present

## 2023-09-17 DIAGNOSIS — M25561 Pain in right knee: Secondary | ICD-10-CM | POA: Diagnosis not present

## 2023-09-18 ENCOUNTER — Ambulatory Visit (HOSPITAL_COMMUNITY): Payer: Medicare Other | Attending: Cardiology

## 2023-09-18 DIAGNOSIS — I480 Paroxysmal atrial fibrillation: Secondary | ICD-10-CM | POA: Diagnosis not present

## 2023-09-18 DIAGNOSIS — I77819 Aortic ectasia, unspecified site: Secondary | ICD-10-CM | POA: Diagnosis not present

## 2023-09-18 DIAGNOSIS — Z23 Encounter for immunization: Secondary | ICD-10-CM | POA: Diagnosis not present

## 2023-09-18 LAB — ECHOCARDIOGRAM COMPLETE
Area-P 1/2: 2.71 cm2
P 1/2 time: 562 ms
S' Lateral: 3.2 cm

## 2023-09-19 NOTE — Progress Notes (Signed)
Cardiology Office Note:    Date:  09/21/2023   ID:  Huntyr Linney, DOB 1951-04-13, MRN [id number]  PCP:  Emilio Aspen, MD  Cardiologist:  Little Ishikawa, MD  Electrophysiologist:  Lanier Prude, MD   Referring MD: Emilio Aspen, *   Chief Complaint  Patient presents with   Leg Swelling     History of Present Illness:    [name] is a 72 y.o. male with a hx of paroxysmal atrial fibrillation, hypertension, OSA, DVT, prostate cancer, PAD, hyperlipidemia who presents for follow-up.  He has had issues with recurrent venous thromboembolism, currently on Eliquis.  He was admitted to [facility] in October 2017 with atrial fibrillation.  He converted to sinus rhythm during admission and was started on flecainide and diltiazem.  Echocardiogram at that time showed normal LV function, moderate to severe left atrial dilatation, no significant valvular disease.  He spends 6 months/year in [place] and reports that in 2020 established with a cardiologist in [place].  Stress test was done, though patient denied having any symptoms.  Stress test was abnormal and underwent cardiac catheterization which showed 60 to 70% stenosis of D1.  Medical management was recommended.  Reports that in August 2021 he had his first recurrence of atrial fibrillation since 2017.  States that he went into AF for about 1 hour with heart rate in 150s.  Converted spontaneously.  Reports that he is active, walks 2.5 miles per day.  Denies any exertional symptoms.  Does report rare resting chest pain that he describes as left-sided sharp pain, but no exertional chest pain.  Denies any lightheadedness or syncope.  Reports that he smoked for 20 years, up to 2 packs/day but quit in 2000.  No known history of heart disease in his immediate family.  Echocardiogram 09/08/2020 showed normal biventricular function, mild AI, dilatation of the ascending aorta measuring 42 mm.  CTA chest on 09/20/2020 showed mild  dilatation of the ascending aorta measuring up to 40 mm.  MRA 09/12/2021 showed stable ascending aortic dilatation measuring 40 mm.  Underwent A. fib ablation with Dr. Lalla Brothers on 11/25/2020.  Echocardiogram 09/18/2023 showed EF 60 to 65%, moderate LVH, grade 2 diastolic dysfunction, normal RV function, no significant valvular disease, dilated ascending aorta measuring 42 mm.  Since last clinic visit, he reports he is doing well.  Does report has been having some swelling in his legs.  He denies any chest pain, dyspnea, or palpitations.  Reports BP has been 120s over 70s when checks at home.  Reports compliance with CPAP.  Reports hemorrhoid bleeding has improved, now occurring rarely, otherwise denies any bleeding on Eliquis.   Wt Readings from Last 3 Encounters:  09/21/23 228 lb (103.4 kg)  08/08/23 228 lb (103.4 kg)  04/25/23 227 lb 9.6 oz (103.2 kg)       Past Medical History:  Diagnosis Date   Arthritis    arthritis -back   Atrial fibrillation (HCC)    a. diagnosed 09/2016 - started on Cardizem CD and Flecainide. Continue Coumadin for anticoagulation.   Cancer [facility])    cancer Prostate- surgery only   Chronic anticoagulation 09/10/2016   History of DVT (deep vein thrombosis)    History of DVT of lower extremity    left leg has some residual crculation issues   History of prostate cancer    Hyperlipidemia 09/09/2018   Hypertension    Hypertensive heart disease without CHF 09/29/2016   Obesity (BMI 30-39.9)    Paroxysmal atrial  fibrillation (HCC)    CHA2DS2VASC score 2   Sleep apnea    a. uses CPAP   Sleep apnea in adult     Past Surgical History:  Procedure Laterality Date   ATRIAL FIBRILLATION ABLATION N/A 11/25/2020   Procedure: ATRIAL FIBRILLATION ABLATION;  Surgeon: Lanier Prude, MD;  Location: [facility] INVASIVE CV LAB;  Service: Cardiovascular;  Laterality: N/A;   BALLOON DILATION N/A 10/12/2015   Procedure: BALLOON DILATION;  Surgeon: Charolett Bumpers, MD;  Location:  [facility] ENDOSCOPY;  Service: Endoscopy;  Laterality: N/A;   COLONOSCOPY W/ POLYPECTOMY     x2 colon polyps found   COLONOSCOPY WITH PROPOFOL N/A 10/12/2015   Procedure: COLONOSCOPY WITH PROPOFOL;  Surgeon: Charolett Bumpers, MD;  Location: [facility] ENDOSCOPY;  Service: Endoscopy;  Laterality: N/A;   ESOPHAGOGASTRODUODENOSCOPY (EGD) WITH PROPOFOL N/A 10/12/2015   Procedure: ESOPHAGOGASTRODUODENOSCOPY (EGD) WITH PROPOFOL;  Surgeon: Charolett Bumpers, MD;  Location: [facility] ENDOSCOPY;  Service: Endoscopy;  Laterality: N/A;   SHOULDER ARTHROTOMY     x2 procedures- 1 open, 1 scope.   TONSILLECTOMY     TRANSURETHRAL RESECTION OF PROSTATE      Current Medications: Current Meds  Medication Sig   apixaban (ELIQUIS) 5 MG TABS tablet Take 1 tablet (5 mg total) by mouth 2 (two) times daily.   Calcium Carbonate-Vitamin D 600-200 MG-UNIT CAPS Take 1 tablet by mouth daily with breakfast.   Cholecalciferol (VITAMIN D) 50 MCG (2000 UT) tablet Take 2,000 Units by mouth daily.   Coenzyme Q10 (COQ-10) 200 MG CAPS Take 200 mg by mouth at bedtime.   diltiazem (DILACOR XR) 180 MG 24 hr capsule Take 1 capsule (180 mg total) by mouth daily.   FOLIC ACID PO Take 1,000 mcg by mouth daily.   hydrocortisone 2.5 % cream Apply topically 2 (two) times daily.   ipratropium (ATROVENT) 0.06 % nasal spray 1 to 2 sprays in each nostril 3 times a day   lisinopril-hydrochlorothiazide (ZESTORETIC) 20-25 MG tablet Take 1 tablet by mouth daily.   MAGNESIUM CITRATE PO Take 1,000 mg by mouth in the morning.   potassium chloride SA (K-DUR,KLOR-CON) 20 MEQ tablet Take 20 mEq by mouth at bedtime.   Testosterone 20.25 MG/ACT (1.62%) GEL Apply 3 Pump topically daily.   triamcinolone cream (KENALOG) 0.1 % Apply topically.   triamcinolone ointment (KENALOG) 0.1 % APPLY A SMALL AMOUNT/ THIN LAYER TO AFFECTED AREAS ON THE SKIN TWICE A DAY FOR FOR 1 TO 3 WEEKS AS NEEDED FOR INFLAMMATION.   Turmeric (QC TUMERIC COMPLEX) 500 MG CAPS Take 2 tablets by mouth  daily.   vitamin B-12 (CYANOCOBALAMIN) 1000 MCG tablet Take 1,000 mcg by mouth daily.   [DISCONTINUED] Evolocumab (REPATHA SURECLICK) 140 MG/ML SOAJ Inject 140 mg into the skin every 14 (fourteen) days.     Allergies:   Shellfish allergy, Atorvastatin, Lactose, Zonisamide, and Rosuvastatin   Social History   Socioeconomic History   Marital status: Married    Spouse name: Not on file   Number of children: Not on file   Years of education: Not on file   Highest education level: Not on file  Occupational History   Not on file  Tobacco Use   Smoking status: Former    Current packs/day: 0.00    Types: Cigarettes    Start date: 12/05/1983    Quit date: 12/04/1998    Years since quitting: 24.8   Smokeless tobacco: Never  Vaping Use   Vaping status: Never Used  Substance and Sexual Activity  Alcohol use: No   Drug use: No   Sexual activity: Not on file  Other Topics Concern   Not on file  Social History Narrative   Not on file   Social Determinants of Health   Financial Resource Strain: Not on file  Food Insecurity: Not on file  Transportation Needs: Not on file  Physical Activity: Not on file  Stress: Not on file  Social Connections: Not on file     Family History: The patient's family history includes Allergic rhinitis in his brother and sister; Hypertension in his brother and sister; Prostate cancer in his brother.  ROS:   Please see the history of present illness.     All other systems reviewed and are negative.  EKGs/Labs/Other Studies Reviewed:    The following studies were reviewed today:  US Venous Left LE 11/30/2020: 1. No evidence of acute DVT within the left lower extremity. 2. The examination is positive for nonocclusive wall thickening/chronic DVT involving the distal aspect of the left femoral vein and the left popliteal vein, similar to remote examination performed in 2010. 3. Chronic occlusive superficial thrombophlebitis involving the proximal  aspect of the left greater saphenous vein as well as the imaged portions of the left lesser saphenous vein.  EP Study/Afib Ablation 11/25/2020: CONCLUSIONS: 1. Successful PVI 2. Intracardiac echo reveals vertical heart, normal LV function, 4 PVs and trivial pericardial effusion. 3. No early apparent complications.  CT Angio Chest 09/20/2020: 1. Ascending thoracic aortic caliber 4.0 cm axial dimension. Mildly aneurysmal. Recommend annual imaging followup by CTA or MRA. This recommendation follows 2010 ACCF/AHA/AATS/ACR/ASA/SCA/SCAI/SIR/STS/SVM Guidelines for the Diagnosis and Management of Patients with Thoracic Aortic Disease. Circulation. 2010; 121: N829-F621. Aortic aneurysm NOS (ICD10-I71.9) 2. Coronary artery disease with calcification of the LEFT coronary circulation. 3. Renal and cystic hepatic lesions. Please see prior abdominal CT for further detail regarding follow-up. 4. Aortic atherosclerosis.   Aortic Atherosclerosis (ICD10-I70.0).  Echo 09/08/2020:  1. Left ventricular ejection fraction, by estimation, is 60 to 65%. The  left ventricle has normal function. The left ventricle has no regional  wall motion abnormalities. There is mild concentric left ventricular  hypertrophy. Left ventricular diastolic  parameters were normal.   2. Right ventricular systolic function is normal. The right ventricular  size is normal. There is normal pulmonary artery systolic pressure. The  estimated right ventricular systolic pressure is 19.0 mmHg.   3. The mitral valve is grossly normal. Trivial mitral valve  regurgitation. No evidence of mitral stenosis.   4. The aortic valve is tricuspid. There is moderate calcification of the  aortic valve. There is moderate thickening of the aortic valve. Aortic  valve regurgitation is mild. Mild to moderate aortic valve  sclerosis/calcification is present, without any  evidence of aortic stenosis.   5. There is mild dilatation of the ascending  aorta, measuring 42 mm.   6. The inferior vena cava is normal in size with greater than 50%  respiratory variability, suggesting right atrial pressure of 3 mmHg.   Comparison(s): Changes from prior study are noted. EF normal 60-65%.  Normal LA. Mild AI.   EKG:   04/25/23: NSR with PACs, rate 64 10/10/22: NSR with PACs, rate 65 05/30/2022: Sinus rhythm, rate 64, PACs, no ST abnormalities 05/12/2021: Sinus rhythm with frequent PACs, rate 63, no ST abnormalities 10/11/2020: EKG was not ordered.  08/25/2020: normal sinus rhythm, rate 60, no ST/T abnormalities.    Recent Labs: No results found for requested labs within last 365 days.  Recent Lipid Panel    Component Value Date/Time   CHOL 136 05/30/2022 1407   TRIG 67 05/30/2022 1407   HDL 65 05/30/2022 1407   CHOLHDL 2.1 05/30/2022 1407   CHOLHDL 3.4 09/09/2016 0421   VLDL 15 09/09/2016 0421   LDLCALC 57 05/30/2022 1407    Physical Exam:    VS:  BP 124/68 (BP Location: Left Arm, Patient Position: Sitting, Cuff Size: Normal)   Pulse 72   Ht 6' (1.829 m)   Wt 228 lb (103.4 kg)   SpO2 98%   BMI 30.92 kg/m     Wt Readings from Last 3 Encounters:  09/21/23 228 lb (103.4 kg)  08/08/23 228 lb (103.4 kg)  04/25/23 227 lb 9.6 oz (103.2 kg)     GEN:  Well nourished, well developed in no acute distress HEENT: Normal NECK: No JVD; No carotid bruits CARDIAC: RRR, no murmurs, rubs, gallops RESPIRATORY:  Clear to auscultation without rales, wheezing or rhonchi  ABDOMEN: Soft, non-tender, non-distended MUSCULOSKELETAL:  Trace edema; No deformity  SKIN: Warm and dry NEUROLOGIC:  Alert and oriented x 3 PSYCHIATRIC:  Normal affect   ASSESSMENT:    1. Paroxysmal atrial fibrillation (HCC)   2. Coronary artery disease involving native coronary artery of native heart without angina pectoris   3. Aortic dilatation (HCC)   4. Lower extremity edema   5. Essential hypertension   6. PAD (peripheral artery disease) (HCC)       PLAN:     Paroxysmal atrial fibrillation/flutter: CHA2DS2-VASc score 3 (hypertension, age, PAD).  Echocardiogram 09/08/2020 showed normal biventricular function, no significant valvular disease.  Underwent ablation with Dr. Lalla Brothers on 11/25/2020.  Appears to be maintaining sinus rhythm. -Continue Eliquis 5 mg twice daily -Continue diltiazem 180 mg daily  CAD: Catheterization in [place] in 2021 showed 60 to 70% D1 stenosis.  Managing medically.  Currently denies any chest pain -Continue Eliquis  -Continue Repatha  Aortic dilatation: Ascending aorta measures 40 mm on CTA 09/20/2020.  MRA 09/12/2021 showed stable ascending aortic dilatation measuring 40 mm.  Aortic dilatation measured 42 mm on echocardiogram 09/18/2023.  Will monitor, plan repeat echocardiogram in 1 year  Lower extremity edema: Reports recent mild lower extremity edema, trace on exam today.  Otherwise appears euvolemic, no JVD and lungs are clear.  Labs yesterday showed normal albumin.  Echo 09/18/2023 showed RAP 3 mmHg.  Suspect due to venous insufficiency, recommend compression stockings  Hypertension: On lisinopril-hydrochlorothiazide 20-12.5 mg daily and diltiazem 180 mg daily.  Appears controlled.    PAD: Continue Eliquis and statin  Hyperlipidemia: On atorvastatin 10 mg daily, LDL 57 on 05/30/2022.  However had to stop atorvastatin due to myalgias.  Started rosuvastatin 5 mg daily but unable to tolerate.  Referred to pharmacy lipid clinic and started on Repatha.  LDL 41 on 03/08/2023  DVT: History of recurrent venous thromboembolism.  Continue Eliquis.    OSA: Continue CPAP.  Reports compliance.   Prediabetes: A1c 5.7% on 05/30/2022  Bone lesion: MRI showed abnormal enhancement in ninth rib.  Given history of prostate cancer, could be concerning for osseous metastatic disease.  PSA undetectable.  Bone scan showed focal activity at bilateral ninth costovertebral junctions, felt to be degenerative or posttraumatic   Hemorrhoids:  reports occasional bleeding.  Referred to GI for evaluation, he was referred to general surgery suspected issue was more constipation than hemorrhoids and recommend follow-up with GI.  RTC in 6 months  Medication Adjustments/Labs and Tests Ordered: Current medicines are reviewed at  length with the patient today.  Concerns regarding medicines are outlined above.  No orders of the defined types were placed in this encounter.   Meds ordered this encounter  Medications   Evolocumab (REPATHA SURECLICK) 140 MG/ML SOAJ    Sig: Inject 140 mg into the skin every 14 (fourteen) days.    Dispense:  2 mL    Refill:  11     Patient Instructions  Medication Instructions:  Your physician recommends that you continue on your current medications as directed. Please refer to the Current Medication list given to you today.  *If you need a refill on your cardiac medications before your next appointment, please call your pharmacy*  Lab Work: If you have labs (blood work) drawn today and your tests are completely normal, you will receive your results only by: MyChart Message (if you have MyChart) OR A paper copy in the mail If you have any lab test that is abnormal or we need to change your treatment, we will call you to review the results.  Testing/Procedures: None ordered today.  Follow-Up: At [facility], you and your health needs are our priority.  As part of our continuing mission to provide you with exceptional heart care, we have created designated Provider Care Teams.  These Care Teams include your primary Cardiologist (physician) and Advanced Practice Providers (APPs -  Physician Assistants and Nurse Practitioners) who all work together to provide you with the care you need, when you need it.  We recommend signing up for the patient portal called "MyChart".  Sign up information is provided on this After Visit Summary.  MyChart is used to connect with patients for Virtual Visits  (Telemedicine).  Patients are able to view lab/test results, encounter notes, upcoming appointments, etc.  Non-urgent messages can be sent to your provider as well.   To learn more about what you can do with MyChart, go to [email or web address].    Your next appointment:   6 month(s)  Provider:   Little Ishikawa, MD     Other Instructions Your provider recommends you wear compression socks.      Signed, Little Ishikawa, MD  09/21/2023 9:18 AM    [facility]

## 2023-09-20 ENCOUNTER — Other Ambulatory Visit (HOSPITAL_COMMUNITY): Payer: Self-pay | Admitting: *Deleted

## 2023-09-20 DIAGNOSIS — I10 Essential (primary) hypertension: Secondary | ICD-10-CM | POA: Diagnosis not present

## 2023-09-20 DIAGNOSIS — R600 Localized edema: Secondary | ICD-10-CM | POA: Diagnosis not present

## 2023-09-20 DIAGNOSIS — R195 Other fecal abnormalities: Secondary | ICD-10-CM | POA: Diagnosis not present

## 2023-09-20 DIAGNOSIS — E785 Hyperlipidemia, unspecified: Secondary | ICD-10-CM

## 2023-09-20 DIAGNOSIS — D2262 Melanocytic nevi of left upper limb, including shoulder: Secondary | ICD-10-CM | POA: Diagnosis not present

## 2023-09-20 DIAGNOSIS — D6869 Other thrombophilia: Secondary | ICD-10-CM | POA: Diagnosis not present

## 2023-09-20 DIAGNOSIS — L821 Other seborrheic keratosis: Secondary | ICD-10-CM | POA: Diagnosis not present

## 2023-09-20 DIAGNOSIS — R7303 Prediabetes: Secondary | ICD-10-CM | POA: Diagnosis not present

## 2023-09-20 DIAGNOSIS — M48061 Spinal stenosis, lumbar region without neurogenic claudication: Secondary | ICD-10-CM | POA: Diagnosis not present

## 2023-09-20 DIAGNOSIS — G4733 Obstructive sleep apnea (adult) (pediatric): Secondary | ICD-10-CM | POA: Diagnosis not present

## 2023-09-20 DIAGNOSIS — D225 Melanocytic nevi of trunk: Secondary | ICD-10-CM | POA: Diagnosis not present

## 2023-09-20 DIAGNOSIS — I480 Paroxysmal atrial fibrillation: Secondary | ICD-10-CM | POA: Diagnosis not present

## 2023-09-20 DIAGNOSIS — I77819 Aortic ectasia, unspecified site: Secondary | ICD-10-CM

## 2023-09-20 DIAGNOSIS — I2510 Atherosclerotic heart disease of native coronary artery without angina pectoris: Secondary | ICD-10-CM | POA: Diagnosis not present

## 2023-09-20 DIAGNOSIS — D2271 Melanocytic nevi of right lower limb, including hip: Secondary | ICD-10-CM | POA: Diagnosis not present

## 2023-09-20 DIAGNOSIS — E291 Testicular hypofunction: Secondary | ICD-10-CM | POA: Diagnosis not present

## 2023-09-20 DIAGNOSIS — I119 Hypertensive heart disease without heart failure: Secondary | ICD-10-CM

## 2023-09-20 DIAGNOSIS — K649 Unspecified hemorrhoids: Secondary | ICD-10-CM | POA: Diagnosis not present

## 2023-09-20 DIAGNOSIS — D1801 Hemangioma of skin and subcutaneous tissue: Secondary | ICD-10-CM | POA: Diagnosis not present

## 2023-09-20 DIAGNOSIS — L814 Other melanin hyperpigmentation: Secondary | ICD-10-CM | POA: Diagnosis not present

## 2023-09-20 DIAGNOSIS — L570 Actinic keratosis: Secondary | ICD-10-CM | POA: Diagnosis not present

## 2023-09-20 DIAGNOSIS — Z85828 Personal history of other malignant neoplasm of skin: Secondary | ICD-10-CM | POA: Diagnosis not present

## 2023-09-20 DIAGNOSIS — I700 Atherosclerosis of aorta: Secondary | ICD-10-CM | POA: Diagnosis not present

## 2023-09-21 ENCOUNTER — Ambulatory Visit: Payer: Medicare Other | Attending: Cardiology | Admitting: Cardiology

## 2023-09-21 ENCOUNTER — Encounter: Payer: Self-pay | Admitting: Cardiology

## 2023-09-21 VITALS — BP 124/68 | HR 72 | Ht 72.0 in | Wt 228.0 lb

## 2023-09-21 DIAGNOSIS — I480 Paroxysmal atrial fibrillation: Secondary | ICD-10-CM | POA: Insufficient documentation

## 2023-09-21 DIAGNOSIS — I77819 Aortic ectasia, unspecified site: Secondary | ICD-10-CM | POA: Insufficient documentation

## 2023-09-21 DIAGNOSIS — I2510 Atherosclerotic heart disease of native coronary artery without angina pectoris: Secondary | ICD-10-CM | POA: Insufficient documentation

## 2023-09-21 DIAGNOSIS — I739 Peripheral vascular disease, unspecified: Secondary | ICD-10-CM | POA: Diagnosis not present

## 2023-09-21 DIAGNOSIS — I10 Essential (primary) hypertension: Secondary | ICD-10-CM | POA: Diagnosis not present

## 2023-09-21 DIAGNOSIS — R600 Localized edema: Secondary | ICD-10-CM | POA: Diagnosis not present

## 2023-09-21 MED ORDER — REPATHA SURECLICK 140 MG/ML ~~LOC~~ SOAJ: 1.0000 mL | SUBCUTANEOUS | 11 refills | Status: DC

## 2023-09-21 NOTE — Patient Instructions (Addendum)
Medication Instructions:  Your physician recommends that you continue on your current medications as directed. Please refer to the Current Medication list given to you today.  *If you need a refill on your cardiac medications before your next appointment, please call your pharmacy*  Lab Work: If you have labs (blood work) drawn today and your tests are completely normal, you will receive your results only by: MyChart Message (if you have MyChart) OR A paper copy in the mail If you have any lab test that is abnormal or we need to change your treatment, we will call you to review the results.  Testing/Procedures: None ordered today.  Follow-Up: At [facility], you and your health needs are our priority.  As part of our continuing mission to provide you with exceptional heart care, we have created designated Provider Care Teams.  These Care Teams include your primary Cardiologist (physician) and Advanced Practice Providers (APPs -  Physician Assistants and Nurse Practitioners) who all work together to provide you with the care you need, when you need it.  We recommend signing up for the patient portal called "MyChart".  Sign up information is provided on this After Visit Summary.  MyChart is used to connect with patients for Virtual Visits (Telemedicine).  Patients are able to view lab/test results, encounter notes, upcoming appointments, etc.  Non-urgent messages can be sent to your provider as well.   To learn more about what you can do with MyChart, go to [email or web address].    Your next appointment:   6 month(s)  Provider:   Little Ishikawa, MD     Other Instructions Your provider recommends you wear compression socks.

## 2023-09-24 DIAGNOSIS — R195 Other fecal abnormalities: Secondary | ICD-10-CM | POA: Diagnosis not present

## 2023-09-24 DIAGNOSIS — M25561 Pain in right knee: Secondary | ICD-10-CM | POA: Diagnosis not present

## 2023-09-26 DIAGNOSIS — K648 Other hemorrhoids: Secondary | ICD-10-CM | POA: Diagnosis not present

## 2023-09-26 DIAGNOSIS — I480 Paroxysmal atrial fibrillation: Secondary | ICD-10-CM | POA: Diagnosis not present

## 2023-09-26 DIAGNOSIS — K5900 Constipation, unspecified: Secondary | ICD-10-CM | POA: Diagnosis not present

## 2023-10-03 ENCOUNTER — Telehealth: Payer: Self-pay | Admitting: Physician Assistant

## 2023-10-03 ENCOUNTER — Telehealth: Payer: Self-pay | Admitting: Internal Medicine

## 2023-10-03 NOTE — Telephone Encounter (Addendum)
   The patient called the answering service after-hours today. He has h/o atrial fibrillation with ablation by Dr. Lalla Brothers in 2021. He felt himself go back into atrial fib about an hour ago, confirmed by Lourena Simmonds with HR 154-155. He is on Diltiazem 180mg  daily (takes 8pm daily) + Eliquis 5mg  BID. He reports adherence with regimen. He can feel his heart pounding stereotypical of prior afib but otherwise no overt CP or SOB. No dizziness. He took his diltiazem a little early a few minutes ago.  His BP cuff is giving him readings all over the place: 122/103 127/109 89/68 110/64  While on the phone he feels the pounding might be easing off, and just got the most recent readings: 104/54 140/79  He recalls in the past he was told to take an extra diltiazem when this happened and it worked, so was wondering if he should take it again. Given the variable BP readings and fact that he just took the diltiazem a short while ago and otherwise feels OK, would recommend we see how he does on the dose and give it a few hours. I told him that if he is still in atrial fibrillation within 2-3 hours he should check his BP twice to get two readings and give [place] a call to discuss next steps for that potential second dose of diltiazem. I told him if at any time he begins to feel bad would recommend proceeding to ED. I told him if he does convert back to normal rhythm to hold off taking any additional diltiazem and call office in morning to schedule follow-up appointment and give update on how he is doing. I told him it may be best to hold his lisinopril-hydrochlorothiazide tomorrow morning to allow more blood pressure room for medicine changes but he said that's too many things to do at one time and just plans to discuss when he calls back.   Will cc to Dr. Bjorn Pippin for review as well.  Laurann Montana, PA-C

## 2023-10-03 NOTE — Telephone Encounter (Signed)
[name] is a 72yo male with a history of paroxysmal atrial fibrillation.  He is following up regarding his pounding chest pain and atrial fibrillation that happened earlier in the day.  He currently states his chest pain has resolved.  His most recent heart rate is in the 90s, with a blood pressure of 108/62.  He took 1 dose of diltiazem 180 mg XR around 5 PM.  I instructed the patient that we will continue coursework for now.  He does not need to go to the emergency department.  He will follow-up with his cardiologist Dr. Bjorn Pippin.  The patient agreed with this plan.

## 2023-10-04 ENCOUNTER — Encounter: Payer: Self-pay | Admitting: Cardiology

## 2023-10-04 DIAGNOSIS — I480 Paroxysmal atrial fibrillation: Secondary | ICD-10-CM

## 2023-10-04 NOTE — Telephone Encounter (Signed)
Can we follow-up with patient to see how he is doing this morning?  Would recommend scheduling in A-fib clinic within next week

## 2023-10-04 NOTE — Telephone Encounter (Signed)
Pt submitted [employer]ardia mobile device readings.  Wednesday, October 03, 2023 at 5:03:54 PM Heart Rate: 146 BPM Duration: 30s   Wednesday, October 03, 2023 at 5:23:22 PM Heart Rate: 154 BPM Duration: 30s  Thursday, October 04, 2023 at 6:40:25 AM Heart Rate: 67 BPM Duration: 30s  Pt reports "real bad pounding in chest" yesterday "felt like chest was tearing apart" that lasted around 5hrs.  Said he took his diltiazem last night around 5pm and "felt better". Pt is anticoagulated and compliant with Eliquis. Recent vitals this morning 0800 BP 119/65 HR 69.   Per Dr. Bjorn Pippin, patient referred to afib clinic and message sent to stacey with afib clinic. Patient made aware he will be contacted to schedule an appt with afib clinic. ER precautions reviewed with patient. Patient verbalized understanding.

## 2023-10-05 NOTE — Telephone Encounter (Signed)
Spoke to patient he stated he is feeling better this morning.Stated someone has already called him.He has appointment scheduled with Afib clinic Mon 11/4 at 2:00 pm.

## 2023-10-05 NOTE — Telephone Encounter (Signed)
Patient has appointment with Afib clinic 11/4 at 2:00 pm.

## 2023-10-08 ENCOUNTER — Encounter (HOSPITAL_COMMUNITY): Payer: Self-pay | Admitting: Internal Medicine

## 2023-10-08 ENCOUNTER — Ambulatory Visit (HOSPITAL_COMMUNITY)
Admission: RE | Admit: 2023-10-08 | Discharge: 2023-10-08 | Disposition: A | Discharge status: Home / Self Care | Payer: Medicare Other | Source: Ambulatory Visit | Attending: Physician Assistant | Admitting: Physician Assistant

## 2023-10-08 VITALS — BP 132/72 | HR 76 | Ht 72.0 in | Wt 231.4 lb

## 2023-10-08 DIAGNOSIS — I4892 Unspecified atrial flutter: Secondary | ICD-10-CM | POA: Insufficient documentation

## 2023-10-08 DIAGNOSIS — I10 Essential (primary) hypertension: Secondary | ICD-10-CM | POA: Insufficient documentation

## 2023-10-08 DIAGNOSIS — I2510 Atherosclerotic heart disease of native coronary artery without angina pectoris: Secondary | ICD-10-CM | POA: Insufficient documentation

## 2023-10-08 DIAGNOSIS — I480 Paroxysmal atrial fibrillation: Secondary | ICD-10-CM | POA: Diagnosis not present

## 2023-10-08 DIAGNOSIS — Z6831 Body mass index (BMI) 31.0-31.9, adult: Secondary | ICD-10-CM | POA: Insufficient documentation

## 2023-10-08 DIAGNOSIS — D6869 Other thrombophilia: Secondary | ICD-10-CM | POA: Diagnosis not present

## 2023-10-08 DIAGNOSIS — Z86718 Personal history of other venous thrombosis and embolism: Secondary | ICD-10-CM | POA: Insufficient documentation

## 2023-10-08 DIAGNOSIS — G4733 Obstructive sleep apnea (adult) (pediatric): Secondary | ICD-10-CM | POA: Diagnosis not present

## 2023-10-08 DIAGNOSIS — Z79899 Other long term (current) drug therapy: Secondary | ICD-10-CM | POA: Insufficient documentation

## 2023-10-08 DIAGNOSIS — Z7901 Long term (current) use of anticoagulants: Secondary | ICD-10-CM | POA: Insufficient documentation

## 2023-10-08 DIAGNOSIS — Z7182 Exercise counseling: Secondary | ICD-10-CM | POA: Insufficient documentation

## 2023-10-08 DIAGNOSIS — E785 Hyperlipidemia, unspecified: Secondary | ICD-10-CM | POA: Insufficient documentation

## 2023-10-08 DIAGNOSIS — E669 Obesity, unspecified: Secondary | ICD-10-CM | POA: Diagnosis not present

## 2023-10-08 DIAGNOSIS — S83241A Other tear of medial meniscus, current injury, right knee, initial encounter: Secondary | ICD-10-CM | POA: Diagnosis not present

## 2023-10-08 MED ORDER — MULTAQ 400 MG PO TABS: 400.0000 mg | ORAL_TABLET | Freq: Two times a day (BID) | ORAL | 3 refills | Status: DC

## 2023-10-08 NOTE — Progress Notes (Signed)
Primary Care Physician: Emilio Aspen, MD Primary Cardiologist: Dr Bjorn Pippin Primary Electrophysiologist: Dr Lalla Brothers Referring Physician: Dr Jasmine Pang Ritchey is a 72 y.o. male with a history of CAD, HTN, HLD, OSA, DVT, atrial flutter, and paroxysmal atrial fibrillation who presents for follow up in the [facility].  The patient was initially diagnosed with atrial fibrillation 09/2016 and was admitted. He was started on flecainide and converted to SR after the first dose. He was maintained on flecainide for years but he had a stress test in [place] which was abnormal and LHC showed 60-70% stenosis of D1. His flecainide was stopped 08/25/20 due to presence of CAD. Patient is on Eliquis for a CHADS2VASC score of 3. Since stopping the flecainide, he has had two episodes of afib with symptoms of heart racing and "feeling off."  There were no specific triggers that he could identify.   On follow up today, patient is s/p afib ablation with Dr Lalla Brothers on 11/25/20. He does report that he has had "mini" afib episodes since the ablation but nothing sustained or very symptomatic. He did present to the ED with concern for groin cath site pain and workup was negative for pseudoaneurysm. The pain has completely resolved. He denies CP or swallowing pain.   On follow up 10/08/23, he is currently in NSR. He contacted Cardiology office on 10/30 and 10/31 noting episodes of symptomatic Afib. He has not missed any doses of Eliquis. He feels as though he is going in and out of rhythm by checking Kardiamobile.   Today, he denies symptoms of chest pain, shortness of breath, orthopnea, PND, lower extremity edema, dizziness, presyncope, syncope, bleeding, or neurologic sequela. The patient is tolerating medications without difficulties and is otherwise without complaint today.    Atrial Fibrillation Risk Factors:  he does have symptoms or diagnosis of sleep apnea. he is  compliant with CPAP therapy. he does not have a history of rheumatic fever.   he has a BMI of Body mass index is 31.38 kg/m.Marland Kitchen Filed Weights   10/08/23 1357  Weight: 105 kg     Family History  Problem Relation Age of Onset   Allergic rhinitis Sister    Hypertension Sister    Allergic rhinitis Brother    Prostate cancer Brother    Hypertension Brother      Atrial Fibrillation Management history:  Previous antiarrhythmic drugs: flecainide, Multaq Previous cardioversions: none Previous ablations: 11/25/20 CHADS2VASC score: 3 Anticoagulation history: Eliquis   Past Medical History:  Diagnosis Date   Arthritis    arthritis -back   Atrial fibrillation (HCC)    a. diagnosed 09/2016 - started on Cardizem CD and Flecainide. Continue Coumadin for anticoagulation.   Cancer [facility])    cancer Prostate- surgery only   Chronic anticoagulation 09/10/2016   History of DVT (deep vein thrombosis)    History of DVT of lower extremity    left leg has some residual crculation issues   History of prostate cancer    Hyperlipidemia 09/09/2018   Hypertension    Hypertensive heart disease without CHF 09/29/2016   Obesity (BMI 30-39.9)    Paroxysmal atrial fibrillation (HCC)    CHA2DS2VASC score 2   Sleep apnea    a. uses CPAP   Sleep apnea in adult    Past Surgical History:  Procedure Laterality Date   ATRIAL FIBRILLATION ABLATION N/A 11/25/2020   Procedure: ATRIAL FIBRILLATION ABLATION;  Surgeon: Lanier Prude, MD;  Location: [facility] INVASIVE CV  LAB;  Service: Cardiovascular;  Laterality: N/A;   BALLOON DILATION N/A 10/12/2015   Procedure: BALLOON DILATION;  Surgeon: Charolett Bumpers, MD;  Location: [facility] ENDOSCOPY;  Service: Endoscopy;  Laterality: N/A;   COLONOSCOPY W/ POLYPECTOMY     x2 colon polyps found   COLONOSCOPY WITH PROPOFOL N/A 10/12/2015   Procedure: COLONOSCOPY WITH PROPOFOL;  Surgeon: Charolett Bumpers, MD;  Location: [facility] ENDOSCOPY;  Service: Endoscopy;  Laterality: N/A;    ESOPHAGOGASTRODUODENOSCOPY (EGD) WITH PROPOFOL N/A 10/12/2015   Procedure: ESOPHAGOGASTRODUODENOSCOPY (EGD) WITH PROPOFOL;  Surgeon: Charolett Bumpers, MD;  Location: [facility] ENDOSCOPY;  Service: Endoscopy;  Laterality: N/A;   SHOULDER ARTHROTOMY     x2 procedures- 1 open, 1 scope.   TONSILLECTOMY     TRANSURETHRAL RESECTION OF PROSTATE      Current Outpatient Medications  Medication Sig Dispense Refill   apixaban (ELIQUIS) 5 MG TABS tablet Take 1 tablet (5 mg total) by mouth 2 (two) times daily. 60 tablet    Calcium Carbonate-Vitamin D 600-200 MG-UNIT CAPS Take 1 tablet by mouth daily with breakfast.     Cholecalciferol (VITAMIN D) 50 MCG (2000 UT) tablet Take 2,000 Units by mouth daily.     Coenzyme Q10 (COQ-10) 200 MG CAPS Take 200 mg by mouth at bedtime.     diltiazem (DILACOR XR) 180 MG 24 hr capsule Take 1 capsule (180 mg total) by mouth daily. 90 capsule 3   dronedarone (MULTAQ) 400 MG tablet Take 1 tablet (400 mg total) by mouth 2 (two) times daily with a meal. 60 tablet 3   Evolocumab (REPATHA SURECLICK) 140 MG/ML SOAJ Inject 140 mg into the skin every 14 (fourteen) days. 2 mL 11   FOLIC ACID PO Take 1,000 mcg by mouth daily.     hydrocortisone 2.5 % cream Apply topically 2 (two) times daily.     ipratropium (ATROVENT) 0.06 % nasal spray 1 to 2 sprays in each nostril 3 times a day     lisinopril-hydrochlorothiazide (ZESTORETIC) 20-25 MG tablet Take 1 tablet by mouth daily.     MAGNESIUM CITRATE PO Take 1,000 mg by mouth in the morning.     potassium chloride SA (K-DUR,KLOR-CON) 20 MEQ tablet Take 20 mEq by mouth at bedtime.     Testosterone 20.25 MG/ACT (1.62%) GEL Apply 3 Pump topically daily.  1   triamcinolone cream (KENALOG) 0.1 % Apply topically.     triamcinolone ointment (KENALOG) 0.1 % APPLY A SMALL AMOUNT/ THIN LAYER TO AFFECTED AREAS ON THE SKIN TWICE A DAY FOR FOR 1 TO 3 WEEKS AS NEEDED FOR INFLAMMATION.     Turmeric (QC TUMERIC COMPLEX) 500 MG CAPS Take 2 tablets by mouth  daily.     vitamin B-12 (CYANOCOBALAMIN) 1000 MCG tablet Take 1,000 mcg by mouth daily.     No current facility-administered medications for this encounter.    Allergies  Allergen Reactions   Shellfish Allergy Diarrhea and Nausea And Vomiting   Atorvastatin Other (See Comments)   Lactose Diarrhea   Zonisamide Other (See Comments)    Dizziness   Rosuvastatin Other (See Comments)    Muscle aches    ROS- All systems are reviewed and negative except as per the HPI above.  Physical Exam: Vitals:   10/08/23 1357  BP: 132/72  Pulse: 76  Weight: 105 kg  Height: 6' (1.829 m)    GEN- The patient is well appearing, alert and oriented x 3 today.   Neck - no JVD or carotid bruit noted Lungs-  Clear to ausculation bilaterally, normal work of breathing Heart- Regular rate and rhythm, no murmurs, rubs or gallops, PMI not laterally displaced Extremities- no clubbing, cyanosis, or edema Skin - no rash or ecchymosis noted   Wt Readings from Last 3 Encounters:  10/08/23 105 kg  09/21/23 103.4 kg  08/08/23 103.4 kg    EKG today demonstrates  Vent. rate 76 BPM PR interval 160 ms QRS duration 82 ms QT/QTcB 368/414 ms P-R-T axes 42 0 -23 Normal sinus rhythm with sinus arrhythmia Minimal voltage criteria for LVH, may be normal variant ( R in aVL ) Nonspecific ST abnormality Abnormal ECG When compared with ECG of 09-Dec-2020 08:54, rate is faster Nonspecific ST abnormality is new Confirmed by Truett Mainland [phone]) on 10/08/2023 2:07:27 PM  Echo 09/18/23 demonstrated   1. Left ventricular ejection fraction, by estimation, is 60 to 65%. The  left ventricle has normal function. The left ventricle has no regional  wall motion abnormalities. There is moderate left ventricular hypertrophy.  Left ventricular diastolic  parameters are consistent with Grade II diastolic dysfunction  (pseudonormalization).   2. Right ventricular systolic function is normal. The right ventricular  size  is normal. There is normal pulmonary artery systolic pressure.   3. Left atrial size was mildly dilated.   4. The mitral valve is normal in structure. Trivial mitral valve  regurgitation. No evidence of mitral stenosis.   5. The aortic valve is tricuspid. There is mild calcification of the  aortic valve. There is mild thickening of the aortic valve. Aortic valve  regurgitation is mild. No aortic stenosis is present.   6. Aortic dilatation noted. There is mild dilatation of the ascending  aorta, measuring 42 mm.   7. The inferior vena cava is normal in size with greater than 50%  respiratory variability, suggesting right atrial pressure of 3 mmHg.    Epic records are reviewed at length today  CHA2DS2-VASc Score = 3  The patient's score is based upon: CHF History: 0 HTN History: 1 Diabetes History: 0 Stroke History: 0 Vascular Disease History: 1 Age Score: 1 Gender Score: 0      ASSESSMENT AND PLAN: 1. Paroxysmal Atrial Fibrillation/atrial flutter The patient's CHA2DS2-VASc score is 3, indicating a 3.2% annual risk of stroke.   S/p afib ablation with Dr Lalla Brothers 11/25/20  He is currently in NSR. He notes going in and out of rhythm and would like to restart rhythm control, also would like to talk with Dr. Lalla Brothers regarding possible repeat ablation. He is going to [place] for a few months and plans to call us to schedule f/u with Dr. Lalla Brothers when he returns.  Begin Multaq. Recheck ECG in ~1 week.  Continue diltiazem 180 mg daily Continue Eliquis 5 mg BID with no missed doses.  2. Secondary Hypercoagulable State (ICD10:  D68.69) The patient is at significant risk for stroke/thromboembolism based upon his CHA2DS2-VASc Score of 3.  Continue Apixaban (Eliquis).   3. Obesity Body mass index is 31.38 kg/m. Lifestyle modification was discussed and encouraged including regular physical activity and weight reduction.  4. Obstructive sleep apnea Patient reports compliance with CPAP  therapy.  5. CAD No anginal symptoms.  6. HTN Stable, no changes today.   Repeat ECG in 1 week.    Justin Mend, PA-C Afib Clinic [facility] [address] [place], [place] [address] [phone] 10/08/2023 2:26 PM

## 2023-10-08 NOTE — Patient Instructions (Addendum)
Start Multaq 400mg  twice a day with food

## 2023-10-09 ENCOUNTER — Other Ambulatory Visit: Payer: Self-pay

## 2023-10-09 DIAGNOSIS — I77819 Aortic ectasia, unspecified site: Secondary | ICD-10-CM

## 2023-10-09 DIAGNOSIS — Q2544 Congenital dilation of aorta: Secondary | ICD-10-CM

## 2023-10-15 ENCOUNTER — Other Ambulatory Visit: Payer: Self-pay | Admitting: Cardiology

## 2023-10-16 ENCOUNTER — Ambulatory Visit (HOSPITAL_COMMUNITY)
Admission: RE | Admit: 2023-10-16 | Discharge: 2023-10-16 | Disposition: A | Discharge status: Home / Self Care | Payer: Medicare Other | Source: Ambulatory Visit | Attending: Internal Medicine | Admitting: Internal Medicine

## 2023-10-16 VITALS — HR 63

## 2023-10-16 DIAGNOSIS — Z79899 Other long term (current) drug therapy: Secondary | ICD-10-CM | POA: Insufficient documentation

## 2023-10-16 DIAGNOSIS — I480 Paroxysmal atrial fibrillation: Secondary | ICD-10-CM

## 2023-10-16 DIAGNOSIS — Z5181 Encounter for therapeutic drug level monitoring: Secondary | ICD-10-CM | POA: Diagnosis present

## 2023-10-16 DIAGNOSIS — R008 Other abnormalities of heart beat: Secondary | ICD-10-CM | POA: Insufficient documentation

## 2023-10-16 DIAGNOSIS — I491 Atrial premature depolarization: Secondary | ICD-10-CM | POA: Diagnosis not present

## 2023-10-16 NOTE — Progress Notes (Signed)
Patient doing well with no concerns. He began Multaq on 11/4 and is here for ECG recheck.  ECG today shows  Vent. rate 63 BPM PR interval 170 ms QRS duration 82 ms QT/QTcB 394/403 ms P-R-T axes 44 -8 -5 Sinus rhythm with Premature atrial complexes in a pattern of bigeminy Otherwise normal ECG When compared with ECG of 08-Oct-2023 14:00, PREVIOUS ECG IS PRESENT   Okay to continue Multaq. He has an Apple watch for monitoring rhythm. He will contact office for EP appointment to discuss ablation. He is driving down to [facility] and about a month before he drives back to [place] will call us. He will require Multaq surveillance appointment when he returns.

## 2023-10-24 ENCOUNTER — Other Ambulatory Visit: Payer: Self-pay | Admitting: Cardiology

## 2023-10-26 DIAGNOSIS — Q678 Other congenital deformities of chest: Secondary | ICD-10-CM | POA: Diagnosis not present

## 2023-10-26 DIAGNOSIS — I10 Essential (primary) hypertension: Secondary | ICD-10-CM | POA: Diagnosis not present

## 2023-10-29 ENCOUNTER — Encounter: Payer: Self-pay | Admitting: Cardiology

## 2023-11-21 ENCOUNTER — Other Ambulatory Visit (HOSPITAL_COMMUNITY): Payer: Self-pay | Admitting: Physician Assistant

## 2023-11-21 DIAGNOSIS — R1900 Intra-abdominal and pelvic swelling, mass and lump, unspecified site: Secondary | ICD-10-CM

## 2023-11-21 DIAGNOSIS — R10814 Left lower quadrant abdominal tenderness: Secondary | ICD-10-CM

## 2023-11-21 DIAGNOSIS — R634 Abnormal weight loss: Secondary | ICD-10-CM

## 2023-11-22 ENCOUNTER — Ambulatory Visit (HOSPITAL_COMMUNITY)
Admission: RE | Admit: 2023-11-22 | Discharge: 2023-11-22 | Disposition: A | Discharge status: Home / Self Care | Payer: Medicare Other | Source: Ambulatory Visit | Attending: Physician Assistant | Admitting: Physician Assistant

## 2023-11-22 DIAGNOSIS — R1900 Intra-abdominal and pelvic swelling, mass and lump, unspecified site: Secondary | ICD-10-CM | POA: Diagnosis not present

## 2023-11-22 DIAGNOSIS — N281 Cyst of kidney, acquired: Secondary | ICD-10-CM | POA: Diagnosis not present

## 2023-11-22 DIAGNOSIS — R10814 Left lower quadrant abdominal tenderness: Secondary | ICD-10-CM | POA: Diagnosis not present

## 2023-11-22 DIAGNOSIS — K5730 Diverticulosis of large intestine without perforation or abscess without bleeding: Secondary | ICD-10-CM | POA: Diagnosis not present

## 2023-11-22 DIAGNOSIS — K8020 Calculus of gallbladder without cholecystitis without obstruction: Secondary | ICD-10-CM | POA: Diagnosis not present

## 2023-11-22 DIAGNOSIS — R634 Abnormal weight loss: Secondary | ICD-10-CM | POA: Insufficient documentation

## 2023-11-22 MED ORDER — IOHEXOL 300 MG/ML  SOLN
30.0000 mL | Freq: Once | INTRAMUSCULAR | Status: AC | PRN
  Administered 2023-11-22: 30 mL via ORAL

## 2023-11-22 MED ORDER — IOHEXOL 300 MG/ML  SOLN
100.0000 mL | Freq: Once | INTRAMUSCULAR | Status: AC | PRN
  Administered 2023-11-22: 100 mL via INTRAVENOUS

## 2023-12-03 DIAGNOSIS — N390 Urinary tract infection, site not specified: Secondary | ICD-10-CM | POA: Diagnosis not present

## 2023-12-03 DIAGNOSIS — C61 Malignant neoplasm of prostate: Secondary | ICD-10-CM | POA: Diagnosis not present

## 2023-12-03 DIAGNOSIS — E782 Mixed hyperlipidemia: Secondary | ICD-10-CM | POA: Diagnosis not present

## 2023-12-03 DIAGNOSIS — E559 Vitamin D deficiency, unspecified: Secondary | ICD-10-CM | POA: Diagnosis not present

## 2023-12-03 DIAGNOSIS — R7301 Impaired fasting glucose: Secondary | ICD-10-CM | POA: Diagnosis not present

## 2023-12-03 DIAGNOSIS — R5383 Other fatigue: Secondary | ICD-10-CM | POA: Diagnosis not present

## 2023-12-03 DIAGNOSIS — I10 Essential (primary) hypertension: Secondary | ICD-10-CM | POA: Diagnosis not present

## 2023-12-03 DIAGNOSIS — E538 Deficiency of other specified B group vitamins: Secondary | ICD-10-CM | POA: Diagnosis not present

## 2023-12-04 DIAGNOSIS — R1010 Upper abdominal pain, unspecified: Secondary | ICD-10-CM | POA: Diagnosis not present

## 2023-12-10 DIAGNOSIS — I10 Essential (primary) hypertension: Secondary | ICD-10-CM | POA: Diagnosis not present

## 2023-12-10 DIAGNOSIS — M159 Polyosteoarthritis, unspecified: Secondary | ICD-10-CM | POA: Diagnosis not present

## 2023-12-10 DIAGNOSIS — K869 Disease of pancreas, unspecified: Secondary | ICD-10-CM | POA: Diagnosis not present

## 2023-12-10 DIAGNOSIS — E291 Testicular hypofunction: Secondary | ICD-10-CM | POA: Diagnosis not present

## 2023-12-10 DIAGNOSIS — E782 Mixed hyperlipidemia: Secondary | ICD-10-CM | POA: Diagnosis not present

## 2023-12-10 DIAGNOSIS — I480 Paroxysmal atrial fibrillation: Secondary | ICD-10-CM | POA: Diagnosis not present

## 2023-12-26 ENCOUNTER — Other Ambulatory Visit: Payer: Self-pay | Admitting: Cardiology

## 2024-01-01 DIAGNOSIS — K8020 Calculus of gallbladder without cholecystitis without obstruction: Secondary | ICD-10-CM | POA: Diagnosis not present

## 2024-01-01 DIAGNOSIS — N281 Cyst of kidney, acquired: Secondary | ICD-10-CM | POA: Diagnosis not present

## 2024-01-01 DIAGNOSIS — K869 Disease of pancreas, unspecified: Secondary | ICD-10-CM | POA: Diagnosis not present

## 2024-01-08 DIAGNOSIS — E782 Mixed hyperlipidemia: Secondary | ICD-10-CM | POA: Diagnosis not present

## 2024-01-08 DIAGNOSIS — I10 Essential (primary) hypertension: Secondary | ICD-10-CM | POA: Diagnosis not present

## 2024-01-08 DIAGNOSIS — I480 Paroxysmal atrial fibrillation: Secondary | ICD-10-CM | POA: Diagnosis not present

## 2024-01-08 DIAGNOSIS — K869 Disease of pancreas, unspecified: Secondary | ICD-10-CM | POA: Diagnosis not present

## 2024-01-08 DIAGNOSIS — E291 Testicular hypofunction: Secondary | ICD-10-CM | POA: Diagnosis not present

## 2024-01-14 DIAGNOSIS — K8020 Calculus of gallbladder without cholecystitis without obstruction: Secondary | ICD-10-CM | POA: Diagnosis not present

## 2024-01-14 DIAGNOSIS — K8689 Other specified diseases of pancreas: Secondary | ICD-10-CM | POA: Diagnosis not present

## 2024-01-29 ENCOUNTER — Telehealth: Payer: Self-pay | Admitting: *Deleted

## 2024-01-29 DIAGNOSIS — K869 Disease of pancreas, unspecified: Secondary | ICD-10-CM | POA: Diagnosis not present

## 2024-01-29 DIAGNOSIS — R1013 Epigastric pain: Secondary | ICD-10-CM | POA: Diagnosis not present

## 2024-01-29 NOTE — Telephone Encounter (Signed)
   Pre-operative Risk Assessment    Patient Name: [name]  DOB: [birth year] MRN: [id number]   Date of last office visit: 09/21/23 DR. [facility] Date of next office visit: NONE   Request for Surgical Clearance    Procedure:   EUS/FNA  Date of Surgery:  Clearance 03/20/24                                Surgeon:  DR. Bertram Denver Surgeon's Group or Practice Name:  [facility] Phone number:  [phone] Fax number:  [phone]   Type of Clearance Requested:   - Medical  - Pharmacy:  Hold Apixaban (Eliquis) x 2 DAYS PRIOR   Type of Anesthesia:  Not Indicated   Additional requests/questions:    Elpidio Anis   01/29/2024, 5:51 PM

## 2024-01-30 NOTE — Telephone Encounter (Signed)
 Patient returned call

## 2024-01-30 NOTE — Telephone Encounter (Signed)
   Name: [name]  DOB: August 13, 1951  MRN: [id number]  Primary Cardiologist: Little Ishikawa, MD   Preoperative team, please contact this patient and set up a phone call appointment for further preoperative risk assessment. Please obtain consent and complete medication review. Thank you for your help.  *Of note, surgeons group/practice name is [place] [facility].* Please make sure patient is aware that he  MUST be in [place] at the time of his virtual visit.  We are unable to complete virtual visits if the pt is outside of the state.  I confirm that guidance regarding antiplatelet and oral anticoagulation therapy has been completed and, if necessary, noted below.  Per office protocol, patient can hold Eliquis for 2 days prior to procedure.  Please resume Eliquis as soon as possible postprocedure, at the discretion of the surgeon.   I also confirmed the patient resides in the state of [place]. As per [facility] Medical Board telemedicine laws, the patient must reside in the state in which the provider is licensed.   Joylene Grapes, NP 01/30/2024, 10:35 AM [facility]

## 2024-01-30 NOTE — Telephone Encounter (Signed)
 Left message to call back to the preop team to discuss further.

## 2024-01-30 NOTE — Telephone Encounter (Signed)
 I s/w the pt and he does tell me that he is already in [place]. I explained the legalities the providers whether MD or APP cannot practice outside of the state they are licensed in even if for a televisit included. I asked the pt if he see's a cardiologist or PCP while he is [place]. Pt states he does have a PCP he see's Dr. Shelda Jakes, but no on the cardiologist.  Pt tells me the surgeon asked who prescribes his blood thinner and he said Dr. Darryl Nestle. I did say that is correct, however can not practice outside of the state they are licensed in. Pt said he will check with his PCP as well.   I did tell pt that I will update him with the notes to My Chart as well so that if he needs to provide anything to the surgeon office or the PCP of the conversation today.   I will update ALL parties involved.   Pt aware that we will be unable to sign off on clearance as he is not in [place] at this time.   He does also state the surgeon has him on a wait list for possible sooner surgery date.

## 2024-01-30 NOTE — Telephone Encounter (Signed)
 Patient with diagnosis of A fib/DVT on Eliquis for anticoagulation. Please let requesting office know that he does not have a current CBC or BMP on file  Procedure: EUS/FNA  Date of procedure: 03/20/24   CHA2DS2-VASc Score = 3  This indicates a 3.2% annual risk of stroke. The patient's score is based upon: CHF History: 0 HTN History: 1 Diabetes History: 0 Stroke History: 0 Vascular Disease History: 1 (per imaging studies) Age Score: 1 Gender Score: 0    CrCl overdue Platelet count overdue  Per office protocol, patient can hold Eliquis for 2 days prior to procedure.    **This guidance is not considered finalized until pre-operative APP has relayed final recommendations.**

## 2024-01-31 NOTE — Telephone Encounter (Signed)
 Agree with plan, recommend patient see his PCP in [place]

## 2024-02-01 NOTE — Telephone Encounter (Signed)
 I will forward to preop pool for any final notes and to see Dr. Campbell Lerner notes as well.

## 2024-02-01 NOTE — Telephone Encounter (Signed)
   Patient Name: [name]  DOB: 10/13/1951 MRN: [id number]  Primary Cardiologist: Little Ishikawa, MD  Chart reviewed as part of pre-operative protocol coverage.  Unfortunately, we are unable to provide surgical clearance at this time due to the fact that patient is currently in [place].  He will be having his procedure in [place].  Per Dr. Campbell Lerner recommendation, patient should follow-up with his PCP in [place] for surgical clearance.  I will route this recommendation to the requesting party via Epic fax function and remove from pre-op pool.  Please call with questions.  Joylene Grapes, NP 02/01/2024, 8:31 AM

## 2024-02-04 ENCOUNTER — Telehealth: Payer: Self-pay | Admitting: Cardiology

## 2024-02-04 NOTE — Telephone Encounter (Signed)
 Pt called in asking to speak with preop nurse. Did not wish to go into detail.

## 2024-02-05 NOTE — Telephone Encounter (Signed)
 Our office received a duplicate request. I s/w Joni Reining with [facility] and stated that we received a duplicate and that we did send note previously explaining as why we cannot sign off on the clearance. Joni Reining states yes they got that and they are reaching out to the pt's PCP in [place] for the clearance and ok to disregard the fax today that was sent in error.     Pt called our office yesterday. I called him back and he reminded me of our conversation previously that I was going to send notes to [facility] CHART as the practice cannot provider clearance when the pt is not in the state of [place] at this time. MD's and APP's cannot practice in a state that they are not licensed in.   I apologized I did get the notes into my chart yet, but will do right now. Pt states he has appt with PCP in [place] tomorrow.

## 2024-02-06 DIAGNOSIS — I10 Essential (primary) hypertension: Secondary | ICD-10-CM | POA: Diagnosis not present

## 2024-02-06 DIAGNOSIS — I480 Paroxysmal atrial fibrillation: Secondary | ICD-10-CM | POA: Diagnosis not present

## 2024-02-06 DIAGNOSIS — K869 Disease of pancreas, unspecified: Secondary | ICD-10-CM | POA: Diagnosis not present

## 2024-02-14 DIAGNOSIS — I10 Essential (primary) hypertension: Secondary | ICD-10-CM | POA: Diagnosis not present

## 2024-02-22 DIAGNOSIS — K2970 Gastritis, unspecified, without bleeding: Secondary | ICD-10-CM | POA: Diagnosis not present

## 2024-02-22 DIAGNOSIS — R1013 Epigastric pain: Secondary | ICD-10-CM | POA: Diagnosis not present

## 2024-02-22 DIAGNOSIS — K449 Diaphragmatic hernia without obstruction or gangrene: Secondary | ICD-10-CM | POA: Diagnosis not present

## 2024-02-22 DIAGNOSIS — K869 Disease of pancreas, unspecified: Secondary | ICD-10-CM | POA: Diagnosis not present

## 2024-02-22 DIAGNOSIS — K639 Disease of intestine, unspecified: Secondary | ICD-10-CM | POA: Diagnosis not present

## 2024-02-22 DIAGNOSIS — K8689 Other specified diseases of pancreas: Secondary | ICD-10-CM | POA: Diagnosis not present

## 2024-03-05 DIAGNOSIS — H2513 Age-related nuclear cataract, bilateral: Secondary | ICD-10-CM | POA: Diagnosis not present

## 2024-03-07 DIAGNOSIS — E291 Testicular hypofunction: Secondary | ICD-10-CM | POA: Diagnosis not present

## 2024-03-07 DIAGNOSIS — K2970 Gastritis, unspecified, without bleeding: Secondary | ICD-10-CM | POA: Diagnosis not present

## 2024-03-07 DIAGNOSIS — I10 Essential (primary) hypertension: Secondary | ICD-10-CM | POA: Diagnosis not present

## 2024-03-07 DIAGNOSIS — K869 Disease of pancreas, unspecified: Secondary | ICD-10-CM | POA: Diagnosis not present

## 2024-03-07 DIAGNOSIS — I480 Paroxysmal atrial fibrillation: Secondary | ICD-10-CM | POA: Diagnosis not present

## 2024-03-15 DIAGNOSIS — L0291 Cutaneous abscess, unspecified: Secondary | ICD-10-CM | POA: Diagnosis not present

## 2024-03-18 DIAGNOSIS — L720 Epidermal cyst: Secondary | ICD-10-CM | POA: Diagnosis not present

## 2024-03-20 DIAGNOSIS — K8689 Other specified diseases of pancreas: Secondary | ICD-10-CM | POA: Diagnosis not present

## 2024-03-26 DIAGNOSIS — L0291 Cutaneous abscess, unspecified: Secondary | ICD-10-CM | POA: Diagnosis not present

## 2024-03-26 DIAGNOSIS — E782 Mixed hyperlipidemia: Secondary | ICD-10-CM | POA: Diagnosis not present

## 2024-03-26 DIAGNOSIS — E291 Testicular hypofunction: Secondary | ICD-10-CM | POA: Diagnosis not present

## 2024-03-26 DIAGNOSIS — I10 Essential (primary) hypertension: Secondary | ICD-10-CM | POA: Diagnosis not present

## 2024-03-26 DIAGNOSIS — I480 Paroxysmal atrial fibrillation: Secondary | ICD-10-CM | POA: Diagnosis not present

## 2024-03-26 DIAGNOSIS — K2970 Gastritis, unspecified, without bleeding: Secondary | ICD-10-CM | POA: Diagnosis not present

## 2024-03-26 DIAGNOSIS — K869 Disease of pancreas, unspecified: Secondary | ICD-10-CM | POA: Diagnosis not present

## 2024-03-26 DIAGNOSIS — M159 Polyosteoarthritis, unspecified: Secondary | ICD-10-CM | POA: Diagnosis not present

## 2024-03-31 DIAGNOSIS — K8689 Other specified diseases of pancreas: Secondary | ICD-10-CM | POA: Diagnosis not present

## 2024-03-31 DIAGNOSIS — K838 Other specified diseases of biliary tract: Secondary | ICD-10-CM | POA: Diagnosis not present

## 2024-03-31 DIAGNOSIS — K862 Cyst of pancreas: Secondary | ICD-10-CM | POA: Diagnosis not present

## 2024-04-01 DIAGNOSIS — L02212 Cutaneous abscess of back [any part, except buttock]: Secondary | ICD-10-CM | POA: Diagnosis not present

## 2024-04-24 ENCOUNTER — Ambulatory Visit (HOSPITAL_COMMUNITY)
Admission: RE | Admit: 2024-04-24 | Discharge: 2024-04-24 | Disposition: A | Discharge status: Home / Self Care | Source: Ambulatory Visit | Attending: Internal Medicine | Admitting: Internal Medicine

## 2024-04-24 VITALS — BP 120/60 | HR 54 | Ht 72.0 in | Wt 228.0 lb

## 2024-04-24 DIAGNOSIS — Z85828 Personal history of other malignant neoplasm of skin: Secondary | ICD-10-CM | POA: Diagnosis not present

## 2024-04-24 DIAGNOSIS — D2272 Melanocytic nevi of left lower limb, including hip: Secondary | ICD-10-CM | POA: Diagnosis not present

## 2024-04-24 DIAGNOSIS — L814 Other melanin hyperpigmentation: Secondary | ICD-10-CM | POA: Diagnosis not present

## 2024-04-24 DIAGNOSIS — D6869 Other thrombophilia: Secondary | ICD-10-CM | POA: Diagnosis not present

## 2024-04-24 DIAGNOSIS — I4891 Unspecified atrial fibrillation: Secondary | ICD-10-CM | POA: Insufficient documentation

## 2024-04-24 DIAGNOSIS — D2271 Melanocytic nevi of right lower limb, including hip: Secondary | ICD-10-CM | POA: Diagnosis not present

## 2024-04-24 DIAGNOSIS — L821 Other seborrheic keratosis: Secondary | ICD-10-CM | POA: Diagnosis not present

## 2024-04-24 DIAGNOSIS — C44612 Basal cell carcinoma of skin of right upper limb, including shoulder: Secondary | ICD-10-CM | POA: Diagnosis not present

## 2024-04-24 DIAGNOSIS — L570 Actinic keratosis: Secondary | ICD-10-CM | POA: Diagnosis not present

## 2024-04-24 DIAGNOSIS — I480 Paroxysmal atrial fibrillation: Secondary | ICD-10-CM | POA: Diagnosis not present

## 2024-04-24 DIAGNOSIS — L720 Epidermal cyst: Secondary | ICD-10-CM | POA: Diagnosis not present

## 2024-04-24 DIAGNOSIS — D225 Melanocytic nevi of trunk: Secondary | ICD-10-CM | POA: Diagnosis not present

## 2024-04-24 NOTE — Progress Notes (Signed)
 Primary Care Physician: Benedetta Bradley, MD Primary Cardiologist: Dr Alda Amas Primary Electrophysiologist: Dr Marven Slimmer Referring Physician: Dr Daniel Durand Goehring is a 73 y.o. male with a history of CAD, HTN, HLD, OSA, DVT, atrial flutter, and paroxysmal atrial fibrillation who presents for follow up in the [facility].  The patient was initially diagnosed with atrial fibrillation 09/2016 and was admitted. He was started on flecainide  and converted to SR after the first dose. He was maintained on flecainide  for years but he had a stress test in [place]  which was abnormal and LHC showed 60-70% stenosis of D1. His flecainide  was stopped 08/25/20 due to presence of CAD. Patient is on Eliquis  for a CHADS2VASC score of 3. Since stopping the flecainide , he has had two episodes of afib with symptoms of heart racing and "feeling off."  There were no specific triggers that he could identify.   On follow up today, patient is s/p afib ablation with Dr Marven Slimmer on 11/25/20. He does report that he has had "mini" afib episodes since the ablation but nothing sustained or very symptomatic. He did present to the ED with concern for groin cath site pain and workup was negative for pseudoaneurysm. The pain has completely resolved. He denies CP or swallowing pain.   On follow up 10/08/23, he is currently in NSR. He contacted Cardiology office on 10/30 and 10/31 noting episodes of symptomatic Afib. He has not missed any doses of Eliquis . He feels as though he is going in and out of rhythm by checking Kardiamobile.   On follow up 04/24/24, he is here in NSR. While in [place] , he has noticed increase in Afib after discontinuing Multaq  due to GI issues. His Apple watch notes 12-15% Afib burden with HR in the 140-150s at times. He typically does not have cardiac awareness when he has Afib with RVR but can feel it at night. He is very interested in repeat ablation. No bleeding issues on  Eliquis .   Today, he denies symptoms of chest pain, shortness of breath, orthopnea, PND, lower extremity edema, dizziness, presyncope, syncope, bleeding, or neurologic sequela. The patient is tolerating medications without difficulties and is otherwise without complaint today.    Atrial Fibrillation Risk Factors:  he does have symptoms or diagnosis of sleep apnea. he is compliant with CPAP therapy. he does not have a history of rheumatic fever.   he has a BMI of Body mass index is 30.92 kg/m.Aaron Aas Filed Weights   04/24/24 1023  Weight: 103.4 kg      Family History  Problem Relation Age of Onset   Allergic rhinitis Sister    Hypertension Sister    Allergic rhinitis Brother    Prostate cancer Brother    Hypertension Brother      Atrial Fibrillation Management history:  Previous antiarrhythmic drugs: flecainide , Multaq  Previous cardioversions: none Previous ablations: 11/25/20 CHADS2VASC score: 3 Anticoagulation history: Eliquis    Past Medical History:  Diagnosis Date   Arthritis    arthritis -back   Atrial fibrillation (HCC)    a. diagnosed 09/2016 - started on Cardizem  CD and Flecainide . Continue Coumadin  for anticoagulation.   Cancer [facility])    cancer Prostate- surgery only   Chronic anticoagulation 09/10/2016   History of DVT (deep vein thrombosis)    History of DVT of lower extremity    left leg has some residual crculation issues   History of prostate cancer    Hyperlipidemia 09/09/2018   Hypertension    Hypertensive  heart disease without CHF 09/29/2016   Obesity (BMI 30-39.9)    Paroxysmal atrial fibrillation (HCC)    CHA2DS2VASC score 2   Sleep apnea    a. uses CPAP   Sleep apnea in adult    Past Surgical History:  Procedure Laterality Date   ATRIAL FIBRILLATION ABLATION N/A 11/25/2020   Procedure: ATRIAL FIBRILLATION ABLATION;  Surgeon: Boyce Byes, MD;  Location: [facility];  Service: Cardiovascular;  Laterality: N/A;   BALLOON DILATION  N/A 10/12/2015   Procedure: BALLOON DILATION;  Surgeon: Garrett Kallman, MD;  Location: [facility];  Service: Endoscopy;  Laterality: N/A;   COLONOSCOPY W/ POLYPECTOMY     x2 colon polyps found   COLONOSCOPY WITH PROPOFOL  N/A 10/12/2015   Procedure: COLONOSCOPY WITH PROPOFOL ;  Surgeon: Garrett Kallman, MD;  Location: [facility];  Service: Endoscopy;  Laterality: N/A;   ESOPHAGOGASTRODUODENOSCOPY (EGD) WITH PROPOFOL  N/A 10/12/2015   Procedure: ESOPHAGOGASTRODUODENOSCOPY (EGD) WITH PROPOFOL ;  Surgeon: Garrett Kallman, MD;  Location: [facility];  Service: Endoscopy;  Laterality: N/A;   SHOULDER ARTHROTOMY     x2 procedures- 1 open, 1 scope.   TONSILLECTOMY     TRANSURETHRAL RESECTION OF PROSTATE      Current Outpatient Medications  Medication Sig Dispense Refill   apixaban  (ELIQUIS ) 5 MG TABS tablet Take 1 tablet (5 mg total) by mouth 2 (two) times daily. 60 tablet    Calcium  Carbonate-Vitamin D  600-200 MG-UNIT CAPS Take 1 tablet by mouth daily with breakfast.     Cholecalciferol  (VITAMIN D ) 50 MCG (2000 UT) tablet Take 2,000 Units by mouth daily.     Coenzyme Q10 (COQ-10) 200 MG CAPS Take 200 mg by mouth at bedtime.     diltiazem  (DILT-XR) 180 MG 24 hr capsule TAKE 1 CAPSULE(180 MG) BY MOUTH DAILY 90 capsule 2   FOLIC ACID  PO Take 1,000 mcg by mouth daily.     hydrocortisone 2.5 % cream Apply topically as needed.     ipratropium (ATROVENT) 0.06 % nasal spray 1 to 2 sprays in each nostril 3 times a day     lisinopril -hydrochlorothiazide  (ZESTORETIC ) 20-25 MG tablet Take 1 tablet by mouth daily.     MAGNESIUM CITRATE PO Take 1,000 mg by mouth in the morning.     potassium chloride  SA (K-DUR,KLOR-CON ) 20 MEQ tablet Take 20 mEq by mouth at bedtime.     REPATHA  SURECLICK 140 MG/ML SOAJ ADMINISTER 1 ML UNDER THE SKIN EVERY 14 DAYS 2 mL 11   Testosterone  20.25 MG/ACT (1.62%) GEL Apply 3 Pump topically daily.  1   Turmeric (QC TUMERIC COMPLEX) 500 MG CAPS Take 2 tablets by mouth daily.      vitamin B-12 (CYANOCOBALAMIN ) 1000 MCG tablet Take 1,000 mcg by mouth daily.     No current facility-administered medications for this encounter.    Allergies  Allergen Reactions   Shellfish Allergy  Diarrhea and Nausea And Vomiting   Atorvastatin Other (See Comments)   Lactose Diarrhea   Multaq  [Dronedarone ]     Terrible burning in his stomach   Zonisamide Other (See Comments)    Dizziness   Rosuvastatin  Other (See Comments)    Muscle aches    ROS- All systems are reviewed and negative except as per the HPI above.  Physical Exam: Vitals:   04/24/24 1023  BP: 120/60  Pulse: (!) 54  Weight: 103.4 kg  Height: 6' (1.829 m)    GEN- The patient is well appearing, alert and oriented x 3 today.  Neck - no JVD or carotid bruit noted Lungs- Clear to ausculation bilaterally, normal work of breathing Heart- Regular bradycardic rate and rhythm, no murmurs, rubs or gallops, PMI not laterally displaced Extremities- no clubbing, cyanosis, or edema Skin - no rash or ecchymosis noted   Wt Readings from Last 3 Encounters:  04/24/24 103.4 kg  10/08/23 105 kg  09/21/23 103.4 kg    EKG today demonstrates  Vent. rate 54 BPM PR interval 166 ms QRS duration 82 ms QT/QTcB 414/392 ms P-R-T axes 97 10 10 Sinus bradycardia with marked sinus arrhythmia Otherwise normal ECG When compared with ECG of 16-Oct-2023 14:44, Premature atrial complexes are no longer Present  Echo 09/18/23 demonstrated   1. Left ventricular ejection fraction, by estimation, is 60 to 65%. The  left ventricle has normal function. The left ventricle has no regional  wall motion abnormalities. There is moderate left ventricular hypertrophy.  Left ventricular diastolic  parameters are consistent with Grade II diastolic dysfunction  (pseudonormalization).   2. Right ventricular systolic function is normal. The right ventricular  size is normal. There is normal pulmonary artery systolic pressure.   3. Left atrial  size was mildly dilated.   4. The mitral valve is normal in structure. Trivial mitral valve  regurgitation. No evidence of mitral stenosis.   5. The aortic valve is tricuspid. There is mild calcification of the  aortic valve. There is mild thickening of the aortic valve. Aortic valve  regurgitation is mild. No aortic stenosis is present.   6. Aortic dilatation noted. There is mild dilatation of the ascending  aorta, measuring 42 mm.   7. The inferior vena cava is normal in size with greater than 50%  respiratory variability, suggesting right atrial pressure of 3 mmHg.    Epic records are reviewed at length today  CHA2DS2-VASc Score = 3  The patient's score is based upon: CHF History: 0 HTN History: 1 Diabetes History: 0 Stroke History: 0 Vascular Disease History: 1 (per imaging studies) Age Score: 1 Gender Score: 0      ASSESSMENT AND PLAN: 1. Paroxysmal Atrial Fibrillation/atrial flutter The patient's CHA2DS2-VASc score is 3, indicating a 3.2% annual risk of stroke.   S/p afib ablation with Dr Marven Slimmer 11/25/20  He is currently in NSR. We discussed repeat ablation and also temporary AAD therapy as bridge to ablation. Patient wishes to do ablation; he would like to hold off AAD therapy in the hopes of seeing Dr. Marven Slimmer asap and willing to do ablation immediately. We discussed amiodarone as a bridge to ablation if he notes increasing burden.   2. Secondary Hypercoagulable State (ICD10:  D68.69) The patient is at significant risk for stroke/thromboembolism based upon his CHA2DS2-VASc Score of 3.  Continue Apixaban  (Eliquis ).  Continue without interruption.  3. HTN Stable today.   Will help arrange follow up with Dr. Marven Slimmer to schedule ablation.   Woody Heading, PA-C Af[facility] [address] [place], [place] [address] [phone] 04/24/2024 11:08 AM

## 2024-04-30 ENCOUNTER — Ambulatory Visit: Attending: Cardiology | Admitting: Cardiology

## 2024-04-30 ENCOUNTER — Encounter: Payer: Self-pay | Admitting: Cardiology

## 2024-04-30 VITALS — BP 134/70 | HR 56 | Ht 72.0 in | Wt 229.2 lb

## 2024-04-30 DIAGNOSIS — I10 Essential (primary) hypertension: Secondary | ICD-10-CM | POA: Diagnosis not present

## 2024-04-30 DIAGNOSIS — I2510 Atherosclerotic heart disease of native coronary artery without angina pectoris: Secondary | ICD-10-CM

## 2024-04-30 DIAGNOSIS — E785 Hyperlipidemia, unspecified: Secondary | ICD-10-CM

## 2024-04-30 DIAGNOSIS — I480 Paroxysmal atrial fibrillation: Secondary | ICD-10-CM

## 2024-04-30 DIAGNOSIS — I77819 Aortic ectasia, unspecified site: Secondary | ICD-10-CM

## 2024-04-30 DIAGNOSIS — R600 Localized edema: Secondary | ICD-10-CM | POA: Diagnosis not present

## 2024-04-30 NOTE — Progress Notes (Signed)
 Cardiology Office Note:    Date:  04/30/2024   ID:  [name], DOB 03/24/1951, MRN [id number]  PCP:  Benedetta Bradley, MD  Cardiologist:  Wendie Hamburg, MD  Electrophysiologist:  Boyce Byes, MD   Referring MD: Benedetta Bradley, *   Chief Complaint  Patient presents with   Atrial Fibrillation     History of Present Illness:    [name] is a 73 y.o. male with a hx of CAD, paroxysmal atrial fibrillation, hypertension, OSA, DVT, prostate cancer, PAD, hyperlipidemia who presents for follow-up.  He has had issues with recurrent venous thromboembolism, currently on Eliquis .  He was admitted to [facility] in October 2017 with atrial fibrillation.  He converted to sinus rhythm during admission and was started on flecainide  and diltiazem .  Echocardiogram at that time showed normal LV function, moderate to severe left atrial dilatation, no significant valvular disease.  He spends 6 months/year in [place]  and reports that in 2020 established with a cardiologist in [place] .  Stress test was done, though patient denied having any symptoms.  Stress test was abnormal and underwent cardiac catheterization which showed 60 to 70% stenosis of D1.  Medical management was recommended.    Echocardiogram 09/08/2020 showed normal biventricular function, mild AI, dilatation of the ascending aorta measuring 42 mm.  CTA chest on 09/20/2020 showed mild dilatation of the ascending aorta measuring up to 40 mm.  MRA 09/12/2021 showed stable ascending aortic dilatation measuring 40 mm.  Underwent A. fib ablation with Dr. Marven Slimmer on 11/25/2020.  Echocardiogram 09/18/2023 showed EF 60 to 65%, moderate LVH, grade 2 diastolic dysfunction, normal RV function, no significant valvular disease, dilated ascending aorta measuring 42 mm.  Since last clinic visit, he reports he is doing okay.  Was on multaq  but having GI issues and stopped taking.  Since then has had increased A-fib burden on his Apple  Watch, up to 15 to 20% with heart rates in 140s to 150s.  He reports compliance with CPAP.  Denies any chest pain or dyspnea.  Does report occasional lightheadedness with denies any syncope.  Does report having some lower extremity edema.  Denies any bleeding on Eliquis .   Wt Readings from Last 3 Encounters:  04/30/24 229 lb 3.2 oz (104 kg)  04/24/24 228 lb (103.4 kg)  10/08/23 231 lb 6.4 oz (105 kg)       Past Medical History:  Diagnosis Date   Arthritis    arthritis -back   Atrial fibrillation (HCC)    a. diagnosed 09/2016 - started on Cardizem  CD and Flecainide . Continue Coumadin  for anticoagulation.   Cancer [facility])    cancer Prostate- surgery only   Chronic anticoagulation 09/10/2016   History of DVT (deep vein thrombosis)    History of DVT of lower extremity    left leg has some residual crculation issues   History of prostate cancer    Hyperlipidemia 09/09/2018   Hypertension    Hypertensive heart disease without CHF 09/29/2016   Obesity (BMI 30-39.9)    Paroxysmal atrial fibrillation (HCC)    CHA2DS2VASC score 2   Sleep apnea    a. uses CPAP   Sleep apnea in adult     Past Surgical History:  Procedure Laterality Date   ATRIAL FIBRILLATION ABLATION N/A 11/25/2020   Procedure: ATRIAL FIBRILLATION ABLATION;  Surgeon: Boyce Byes, MD;  Location: [facility] INVASIVE CV LAB;  Service: Cardiovascular;  Laterality: N/A;   BALLOON DILATION N/A 10/12/2015   Procedure: BALLOON DILATION;  Surgeon:  Garrett Kallman, MD;  Location: [facility];  Service: Endoscopy;  Laterality: N/A;   COLONOSCOPY W/ POLYPECTOMY     x2 colon polyps found   COLONOSCOPY WITH PROPOFOL  N/A 10/12/2015   Procedure: COLONOSCOPY WITH PROPOFOL ;  Surgeon: Garrett Kallman, MD;  Location: [facility] ENDOSCOPY;  Service: Endoscopy;  Laterality: N/A;   ESOPHAGOGASTRODUODENOSCOPY (EGD) WITH PROPOFOL  N/A 10/12/2015   Procedure: ESOPHAGOGASTRODUODENOSCOPY (EGD) WITH PROPOFOL ;  Surgeon: Garrett Kallman, MD;  Location: [facility]  ENDOSCOPY;  Service: Endoscopy;  Laterality: N/A;   SHOULDER ARTHROTOMY     x2 procedures- 1 open, 1 scope.   TONSILLECTOMY     TRANSURETHRAL RESECTION OF PROSTATE      Current Medications: Current Meds  Medication Sig   apixaban  (ELIQUIS ) 5 MG TABS tablet Take 1 tablet (5 mg total) by mouth 2 (two) times daily.   Calcium  Carbonate-Vitamin D  600-200 MG-UNIT CAPS Take 1 tablet by mouth daily with breakfast.   Cholecalciferol  (VITAMIN D ) 50 MCG (2000 UT) tablet Take 2,000 Units by mouth daily.   Coenzyme Q10 (COQ-10) 200 MG CAPS Take 200 mg by mouth at bedtime.   diltiazem  (DILT-XR) 180 MG 24 hr capsule TAKE 1 CAPSULE(180 MG) BY MOUTH DAILY   FOLIC ACID  PO Take 1,000 mcg by mouth daily.   hydrocortisone 2.5 % cream Apply topically as needed.   ipratropium (ATROVENT) 0.06 % nasal spray 1 to 2 sprays in each nostril 3 times a day   lansoprazole (PREVACID) 30 MG capsule Take 30 mg by mouth daily.   lisinopril -hydrochlorothiazide  (ZESTORETIC ) 20-25 MG tablet Take 1 tablet by mouth daily.   MAGNESIUM CITRATE PO Take 1,000 mg by mouth in the morning.   potassium chloride  SA (K-DUR,KLOR-CON ) 20 MEQ tablet Take 20 mEq by mouth at bedtime.   REPATHA  SURECLICK 140 MG/ML SOAJ ADMINISTER 1 ML UNDER THE SKIN EVERY 14 DAYS   Testosterone  20.25 MG/ACT (1.62%) GEL Apply 3 Pump topically daily.   Turmeric (QC TUMERIC COMPLEX) 500 MG CAPS Take 2 tablets by mouth daily.   vitamin B-12 (CYANOCOBALAMIN ) 1000 MCG tablet Take 1,000 mcg by mouth daily.     Allergies:   Shellfish allergy , Atorvastatin, Lactose, Multaq  [dronedarone ], Zonisamide, and Rosuvastatin    Social History   Socioeconomic History   Marital status: Married    Spouse name: Not on file   Number of children: Not on file   Years of education: Not on file   Highest education level: Not on file  Occupational History   Not on file  Tobacco Use   Smoking status: Former    Current packs/day: 0.00    Types: Cigarettes    Start date:  12/05/1983    Quit date: 12/04/1998    Years since quitting: 25.4   Smokeless tobacco: Never   Tobacco comments:    Former smoker 10/08/23  Vaping Use   Vaping status: Never Used  Substance and Sexual Activity   Alcohol use: No   Drug use: No   Sexual activity: Not on file  Other Topics Concern   Not on file  Social History Narrative   Not on file   Social Drivers of Health   Financial Resource Strain: Not on file  Food Insecurity: Not on file  Transportation Needs: Not on file  Physical Activity: Not on file  Stress: Not on file  Social Connections: Not on file     Family History: The patient's family history includes Allergic rhinitis in his brother and sister; Hypertension in his brother and sister; Prostate  cancer in his brother.  ROS:   Please see the history of present illness.     All other systems reviewed and are negative.  EKGs/Labs/Other Studies Reviewed:    The following studies were reviewed today:  US  Venous Left LE 11/30/2020: 1. No evidence of acute DVT within the left lower extremity. 2. The examination is positive for nonocclusive wall thickening/chronic DVT involving the distal aspect of the left femoral vein and the left popliteal vein, similar to remote examination performed in 2010. 3. Chronic occlusive superficial thrombophlebitis involving the proximal aspect of the left greater saphenous vein as well as the imaged portions of the left lesser saphenous vein.  EP Study/Afib Ablation 11/25/2020: CONCLUSIONS: 1. Successful PVI 2. Intracardiac echo reveals vertical heart, normal LV function, 4 PVs and trivial pericardial effusion. 3. No early apparent complications.  CT Angio Chest 09/20/2020: 1. Ascending thoracic aortic caliber 4.0 cm axial dimension. Mildly aneurysmal. Recommend annual imaging followup by CTA or MRA. This recommendation follows 2010 ACCF/AHA/AATS/ACR/ASA/SCA/SCAI/SIR/STS/SVM Guidelines for the Diagnosis and Management of  Patients with Thoracic Aortic Disease. Circulation. 2010; 121: Z610-R604. Aortic aneurysm NOS (ICD10-I71.9) 2. Coronary artery disease with calcification of the LEFT coronary circulation. 3. Renal and cystic hepatic lesions. Please see prior abdominal CT for further detail regarding follow-up. 4. Aortic atherosclerosis.   Aortic Atherosclerosis (ICD10-I70.0).  Echo 09/08/2020:  1. Left ventricular ejection fraction, by estimation, is 60 to 65%. The  left ventricle has normal function. The left ventricle has no regional  wall motion abnormalities. There is mild concentric left ventricular  hypertrophy. Left ventricular diastolic  parameters were normal.   2. Right ventricular systolic function is normal. The right ventricular  size is normal. There is normal pulmonary artery systolic pressure. The  estimated right ventricular systolic pressure is 19.0 mmHg.   3. The mitral valve is grossly normal. Trivial mitral valve  regurgitation. No evidence of mitral stenosis.   4. The aortic valve is tricuspid. There is moderate calcification of the  aortic valve. There is moderate thickening of the aortic valve. Aortic  valve regurgitation is mild. Mild to moderate aortic valve  sclerosis/calcification is present, without any  evidence of aortic stenosis.   5. There is mild dilatation of the ascending aorta, measuring 42 mm.   6. The inferior vena cava is normal in size with greater than 50%  respiratory variability, suggesting right atrial pressure of 3 mmHg.   Comparison(s): Changes from prior study are noted. EF normal 60-65%.  Normal LA. Mild AI.   EKG:   04/25/23: NSR with PACs, rate 64 10/10/22: NSR with PACs, rate 65 05/30/2022: Sinus rhythm, rate 64, PACs, no ST abnormalities 05/12/2021: Sinus rhythm with frequent PACs, rate 63, no ST abnormalities 10/11/2020: EKG was not ordered.  08/25/2020: normal sinus rhythm, rate 60, no ST/T abnormalities.    Recent Labs: No results found for  requested labs within last 365 days.  Recent Lipid Panel    Component Value Date/Time   CHOL 136 05/30/2022 1407   TRIG 67 05/30/2022 1407   HDL 65 05/30/2022 1407   CHOLHDL 2.1 05/30/2022 1407   CHOLHDL 3.4 09/09/2016 0421   VLDL 15 09/09/2016 0421   LDLCALC 57 05/30/2022 1407    Physical Exam:    VS:  BP 134/70 (BP Location: Left Arm, Patient Position: Sitting, Cuff Size: Normal)   Pulse (!) 56   Ht 6' (1.829 m)   Wt 229 lb 3.2 oz (104 kg)   SpO2 96%   BMI 31.09  kg/m     Wt Readings from Last 3 Encounters:  04/30/24 229 lb 3.2 oz (104 kg)  04/24/24 228 lb (103.4 kg)  10/08/23 231 lb 6.4 oz (105 kg)     GEN:  Well nourished, well developed in no acute distress HEENT: Normal NECK: No JVD; No carotid bruits CARDIAC: RRR, no murmurs, rubs, gallops RESPIRATORY:  Clear to auscultation without rales, wheezing or rhonchi  ABDOMEN: Soft, non-tender, non-distended MUSCULOSKELETAL:  Trace edema; No deformity  SKIN: Warm and dry NEUROLOGIC:  Alert and oriented x 3 PSYCHIATRIC:  Normal affect   ASSESSMENT:    1. PAF (paroxysmal atrial fibrillation) (HCC)   2. Coronary artery disease involving native coronary artery of native heart without angina pectoris   3. Aortic dilatation (HCC)   4. Lower extremity edema   5. Essential hypertension   6. Hyperlipidemia, unspecified hyperlipidemia type     PLAN:    Paroxysmal atrial fibrillation/flutter: CHA2DS2-VASc score 3 (hypertension, age, PAD).  Echocardiogram 09/08/2020 showed normal biventricular function, no significant valvular disease.  Underwent ablation with Dr. Marven Slimmer on 11/25/2020.  He started on Multaq  but was discontinued due to GI issues.  Noted 12 to 15% A-fib burden on Apple Watch recently -He has been seen by A-fib clinic, has been referred to Dr. Marven Slimmer to consider repeat ablation -Continue Eliquis  5 mg twice daily -Continue diltiazem  180 mg daily  CAD: Catheterization in [place]  in 2021 showed 60 to 70% D1  stenosis.  Managing medically.  Currently denies any chest pain -Continue Eliquis   -Continue Repatha   Aortic dilatation: Ascending aorta measures 40 mm on CTA 09/20/2020.  MRA 09/12/2021 showed stable ascending aortic dilatation measuring 40 mm.  Aortic dilatation measured 42 mm on echocardiogram 09/18/2023.  Will monitor, plan repeat echocardiogram in 1 year  Lower extremity edema: Reports recent mild lower extremity edema, trace on exam today.  Otherwise appears euvolemic, no JVD and lungs are clear.  Suspect due to venous insufficiency, recommend compression stockings.  Will check CMET, BNP  Hypertension: On lisinopril -hydrochlorothiazide  20-12.5 mg daily and diltiazem  180 mg daily.  Appears controlled.    PAD: Continue Eliquis  and statin  Hyperlipidemia: On atorvastatin 10 mg daily, LDL 57 on 05/30/2022.  However had to stop atorvastatin due to myalgias.  Started rosuvastatin  5 mg daily but unable to tolerate.  Referred to pharmacy lipid clinic and started on Repatha .  LDL 41 on 03/08/2023.  Will update lipid panel  DVT: History of recurrent venous thromboembolism.  Continue Eliquis .    OSA: Continue CPAP.  Reports compliance.   Prediabetes: A1c 5.7% on 05/30/2022  Bone lesion: MRI showed abnormal enhancement in ninth rib.  Given history of prostate cancer, could be concerning for osseous metastatic disease.  PSA undetectable.  Bone scan showed focal activity at bilateral ninth costovertebral junctions, felt to be degenerative or posttraumatic   Hemorrhoids: reports occasional bleeding.  Referred to GI for evaluation, he was referred to general surgery suspected issue was more constipation than hemorrhoids and recommend follow-up with GI.  RTC in 6 months  Medication Adjustments/Labs and Tests Ordered: Current medicines are reviewed at length with the patient today.  Concerns regarding medicines are outlined above.  Orders Placed This Encounter  Procedures   Lipid panel   Comprehensive  Metabolic Panel (CMET)   Brain natriuretic peptide   Magnesium    No orders of the defined types were placed in this encounter.    Patient Instructions  Medication Instructions:  Continue current medications *If you need a refill  on your cardiac medications before your next appointment, please call your pharmacy*  Lab Work: Lipid panel. Mg, cmet, bnp If you have labs (blood work) drawn today and your tests are completely normal, you will receive your results only by: MyChart Message (if you have MyChart) OR A paper copy in the mail If you have any lab test that is abnormal or we need to change your treatment, we will call you to review the results.  Testing/Procedures: none  Follow-Up: At [facility], you and your health needs are our priority.  As part of our continuing mission to provide you with exceptional heart care, our providers are all part of one team.  This team includes your primary Cardiologist (physician) and Advanced Practice Providers or APPs (Physician Assistants and Nurse Practitioners) who all work together to provide you with the care you need, when you need it.  Your next appointment:   6 month(s)  Provider:   Wendie Hamburg, MD    We recommend signing up for the patient portal called "MyChart".  Sign up information is provided on this After Visit Summary.  MyChart is used to connect with patients for Virtual Visits (Telemedicine).  Patients are able to view lab/test results, encounter notes, upcoming appointments, etc.  Non-urgent messages can be sent to your provider as well.   To learn more about what you can do with MyChart, go to [email or web address].   Other Instructions none          Signed, Wendie Hamburg, MD  04/30/2024 5:30 PM    [facility]

## 2024-04-30 NOTE — Patient Instructions (Signed)
 Medication Instructions:  Continue current medications *If you need a refill on your cardiac medications before your next appointment, please call your pharmacy*  Lab Work: Lipid panel. Mg, cmet, bnp If you have labs (blood work) drawn today and your tests are completely normal, you will receive your results only by: MyChart Message (if you have MyChart) OR A paper copy in the mail If you have any lab test that is abnormal or we need to change your treatment, we will call you to review the results.  Testing/Procedures: none  Follow-Up: At [facility], you and your health needs are our priority.  As part of our continuing mission to provide you with exceptional heart care, our providers are all part of one team.  This team includes your primary Cardiologist (physician) and Advanced Practice Providers or APPs (Physician Assistants and Nurse Practitioners) who all work together to provide you with the care you need, when you need it.  Your next appointment:   6 month(s)  Provider:   Wendie Hamburg, MD    We recommend signing up for the patient portal called "MyChart".  Sign up information is provided on this After Visit Summary.  MyChart is used to connect with patients for Virtual Visits (Telemedicine).  Patients are able to view lab/test results, encounter notes, upcoming appointments, etc.  Non-urgent messages can be sent to your provider as well.   To learn more about what you can do with MyChart, go to [email or web address].   Other Instructions none

## 2024-05-02 ENCOUNTER — Other Ambulatory Visit: Payer: Self-pay

## 2024-05-02 DIAGNOSIS — E785 Hyperlipidemia, unspecified: Secondary | ICD-10-CM

## 2024-05-02 DIAGNOSIS — I10 Essential (primary) hypertension: Secondary | ICD-10-CM | POA: Diagnosis not present

## 2024-05-02 DIAGNOSIS — R600 Localized edema: Secondary | ICD-10-CM

## 2024-05-03 LAB — LIPID PANEL
Chol/HDL Ratio: 1.9 ratio (ref 0.0–5.0)
Cholesterol, Total: 119 mg/dL (ref 100–199)
HDL: 62 mg/dL (ref >39)
LDL Chol Calc (NIH): 45 mg/dL (ref 0–99)
Triglycerides: 49 mg/dL (ref 0–149)
VLDL Cholesterol Cal: 12 mg/dL (ref 5–40)

## 2024-05-03 LAB — COMPREHENSIVE METABOLIC PANEL WITH GFR
ALT: 14 IU/L (ref 0–44)
AST: 15 IU/L (ref 0–40)
Albumin: 4.3 g/dL (ref 3.8–4.8)
Alkaline Phosphatase: 97 IU/L (ref 44–121)
BUN/Creatinine Ratio: 15 (ref 10–24)
BUN: 13 mg/dL (ref 8–27)
Bilirubin Total: 0.4 mg/dL (ref 0.0–1.2)
CO2: 22 mmol/L (ref 20–29)
Calcium: 9.2 mg/dL (ref 8.6–10.2)
Chloride: 100 mmol/L (ref 96–106)
Creatinine, Ser: 0.89 mg/dL (ref 0.76–1.27)
Globulin, Total: 2.1 g/dL (ref 1.5–4.5)
Glucose: 110 mg/dL — ABNORMAL HIGH (ref 70–99)
Potassium: 4.6 mmol/L (ref 3.5–5.2)
Sodium: 139 mmol/L (ref 134–144)
Total Protein: 6.4 g/dL (ref 6.0–8.5)
eGFR: 90 mL/min/{1.73_m2} (ref >59)

## 2024-05-03 LAB — BRAIN NATRIURETIC PEPTIDE: BNP: 28.4 pg/mL (ref 0.0–100.0)

## 2024-05-03 LAB — MAGNESIUM: Magnesium: 2.3 mg/dL (ref 1.6–2.3)

## 2024-05-05 ENCOUNTER — Ambulatory Visit: Payer: Self-pay | Admitting: Cardiology

## 2024-05-07 ENCOUNTER — Telehealth: Payer: Self-pay

## 2024-05-07 ENCOUNTER — Other Ambulatory Visit: Payer: Self-pay

## 2024-05-07 ENCOUNTER — Encounter: Payer: Self-pay | Admitting: Cardiology

## 2024-05-07 ENCOUNTER — Ambulatory Visit: Attending: Cardiology | Admitting: Cardiology

## 2024-05-07 VITALS — BP 128/68 | HR 61 | Ht 72.0 in | Wt 229.6 lb

## 2024-05-07 DIAGNOSIS — I10 Essential (primary) hypertension: Secondary | ICD-10-CM

## 2024-05-07 DIAGNOSIS — M25561 Pain in right knee: Secondary | ICD-10-CM | POA: Diagnosis not present

## 2024-05-07 DIAGNOSIS — I480 Paroxysmal atrial fibrillation: Secondary | ICD-10-CM

## 2024-05-07 DIAGNOSIS — M25562 Pain in left knee: Secondary | ICD-10-CM | POA: Diagnosis not present

## 2024-05-07 NOTE — Telephone Encounter (Signed)
 Dr. Alda Amas  We have received a surgical clearance request for [name] for a left knee scope procedure. They were seen recently in clinic on 04/30/2024 for follow-up. Can you please comment on surgical clearance and guidance on holding Eliquis  for his upcoming procedure.. Please forward you guidance and recommendations to P CV DIV PREOP   Thank you, Charles Connor, NP

## 2024-05-07 NOTE — Progress Notes (Signed)
  Electrophysiology Office Follow up Visit Note:    Date:  05/07/2024   ID:  [name], DOB [birth year], MRN [id number]  PCP:  Benedetta Bradley, MD  [facility] Cardiologist:  Wendie Hamburg, MD  [facility] Electrophysiologist:  Boyce Byes, MD    Interval History:     [name] is a 73 y.o. male who presents for a follow up visit.   The patient last saw Autry Legions in the A-fib clinic Apr 24, 2024.  I last saw the patient in October 2022.  He had a prior catheter ablation in December 2021.  His Multaq  was stopped after the ablation because of lack of recurrence.  When he saw Autry Legions he reported a 12 to 15% A-fib burden based on his Apple Watch.  He can feel his episodes of atrial fibrillation at night.  He expressed an interest to pursue redo catheter ablation.      Past medical, surgical, social and family history were reviewed.  ROS:   Please see the history of present illness.    All other systems reviewed and are negative.  EKGs/Labs/Other Studies Reviewed:    The following studies were reviewed today:          Physical Exam:    VS:  BP 128/68   Pulse 61   Ht 6' (1.829 m)   Wt 229 lb 9.6 oz (104.1 kg)   SpO2 98%   BMI 31.14 kg/m     Wt Readings from Last 3 Encounters:  05/07/24 229 lb 9.6 oz (104.1 kg)  04/30/24 229 lb 3.2 oz (104 kg)  04/24/24 228 lb (103.4 kg)     GEN: no distress CARD: RRR, No MRG RESP: No IWOB. CTAB.      ASSESSMENT:    1. Paroxysmal atrial fibrillation (HCC)   2. Essential hypertension    PLAN:    In order of problems listed above:  #Atrial fibrillation Recurrent after 2021 catheter ablation.  Previously on Multaq . Continue Eliquis  for stroke prophylaxis I discussed treatment options for the patient including antiarrhythmic drugs and catheter ablation and he is interested in pursuing redo catheter ablation.  I discussed the procedure including the risks, recovery and likelihood of  success.  Discussed treatment options today for AF including antiarrhythmic drug therapy and ablation. Discussed risks, recovery and likelihood of success with each treatment strategy. Risk, benefits, and alternatives to EP study and ablation for afib were discussed. These risks include but are not limited to stroke, bleeding, vascular damage, tamponade, perforation, damage to the esophagus, lungs, phrenic nerve and other structures, pulmonary vein stenosis, worsening renal function, coronary vasospasm and death.  Discussed potential need for repeat ablation procedures and antiarrhythmic drugs after an initial ablation. The patient understands these risk and wishes to proceed.  We will therefore proceed with catheter ablation at the next available time.  Carto, ICE, anesthesia are requested for the procedure.  Will also obtain CT PV protocol prior to the procedure to exclude LAA thrombus and further evaluate atrial anatomy.  #Hypertension At goal today.  Recommend checking blood pressures 1-2 times per week at home and recording the values.  Recommend bringing these recordings to the primary care physician.   Signed, Harvie Liner, MD, [facility], [facility] 05/07/2024 6:21 PM    Electrophysiology [facility]

## 2024-05-07 NOTE — Patient Instructions (Addendum)
 Medication Instructions:  Your physician recommends that you continue on your current medications as directed. Please refer to the Current Medication list given to you today.  *If you need a refill on your cardiac medications before your next appointment, please call your pharmacy*  Lab Work: BMET and CBC - you may go to any [facility] location to have these drawn within 30 days of your procedure (anytime after July 28th). You do not need an appointment.  Testing/Procedures: Cardiac CT Your physician has requested that you have cardiac CT. Cardiac computed tomography (CT) is a painless test that uses an x-ray machine to take clear, detailed pictures of your heart. For further information please visit [email or web address] Please follow instruction sheet as given. We will call you to schedule your CT scan. It will be done about three weeks prior to your ablation.  Ablation Your physician has recommended that you have an ablation. Catheter ablation is a medical procedure used to treat some cardiac arrhythmias (irregular heartbeats). During catheter ablation, a long, thin, flexible tube is put into a blood vessel in your groin (upper thigh), or neck. This tube is called an ablation catheter. It is then guided to your heart through the blood vessel. Radio frequency waves destroy small areas of heart tissue where abnormal heartbeats may cause an arrhythmia to start.  You are scheduled for Atrial Fibrillation Ablation on Monday, August 25 with Dr. Harvie Liner.Please arrive at the Main Entrance A at [facility]: [address] [place], [place] [address] at 5:30 AM    Follow-Up: At [facility], you and your health needs are our priority.  As part of our continuing mission to provide you with exceptional heart care, we have created designated Provider Care Teams.  These Care Teams include your primary Cardiologist (physician) and Advanced Practice Providers (APPs -  Physician Assistants  and Nurse Practitioners) who all work together to provide you with the care you need, when you need it.   Your next appointment:   We will contact you about your post-procedure follow up appointments.

## 2024-05-07 NOTE — Telephone Encounter (Signed)
   Pre-operative Risk Assessment    Patient Name: [name]  DOB: 04-16-1951 MRN: [id number]   Date of last office visit: 05/07/24 Date of next office visit: Not scheduled   Request for Surgical Clearance    Procedure:  left knee scope  Date of Surgery:  Clearance 05/13/24                                Surgeon:  Not provided Surgeon's Group or Practice Name:  [facility] Phone number:  [phone] Fax number:  [phone]   Type of Clearance Requested:   - Medical  - Pharmacy:  Hold Apixaban  (Eliquis )     Type of Anesthesia:  Choice   Additional requests/questions:    Gardiner Jumper   05/07/2024, 4:13 PM \

## 2024-05-08 ENCOUNTER — Other Ambulatory Visit: Payer: Self-pay | Admitting: Orthopaedic Surgery

## 2024-05-08 NOTE — Telephone Encounter (Signed)
 No further cardiac workup recommended prior to procedure.  Can hold Eliquis  2 days prior to procedure

## 2024-05-08 NOTE — Telephone Encounter (Signed)
   Patient Name: [name]  DOB: 04/24/1951 MRN: [id number]  Primary Cardiologist: Wendie Hamburg, MD  Chart reviewed as part of pre-operative protocol coverage. Given past medical history and time since last visit, based on ACC/AHA guidelines, [name] is at acceptable risk for the planned procedure without further cardiovascular testing.   Per Dr. Alda Amas: "No further cardiac workup recommended prior to procedure.  Can hold Eliquis  2 days prior to procedure"   I will route this recommendation to the requesting party via Epic fax function and remove from pre-op pool.  Please call with questions.  Ava Boatman, NP 05/08/2024, 11:17 AM

## 2024-05-21 NOTE — Patient Instructions (Addendum)
 SURGICAL WAITING ROOM VISITATION  Patients having surgery or a procedure may have no more than 2 support people in the waiting area - these visitors may rotate.    Children under the age of 77 must have an adult with them who is not the patient.  Visitors with respiratory illnesses are discouraged from visiting and should remain at home.  If the patient needs to stay at the hospital during part of their recovery, the visitor guidelines for inpatient rooms apply. Pre-op nurse will coordinate an appropriate time for 1 support person to accompany patient in pre-op.  This support person may not rotate.    Please refer to the [facility] website for the visitor guidelines for Inpatients (after your surgery is over and you are in a regular room).    Your procedure is scheduled on: 06/03/24   Report to [facility] Main Entrance    Report to admitting at 12:35 AM   Call this number if you have problems the morning of surgery [phone]   Do not eat food :After Midnight.   After Midnight you may have the following liquids until 11:50 AM DAY OF SURGERY  Water Non-Citrus Juices (without pulp, NO RED-Apple, White grape, White cranberry) Black Coffee (NO MILK/CREAM OR CREAMERS, sugar ok)  Clear Tea (NO MILK/CREAM OR CREAMERS, sugar ok) regular and decaf                             Plain Jell-O (NO RED)                                           Fruit ices (not with fruit pulp, NO RED)                                     Popsicles (NO RED)                                                               Sports drinks like Gatorade (NO RED)    The day of surgery:  Drink ONE (1) Pre-Surgery Clear Ensure at 11:50 AM the morning of surgery. Drink in one sitting. Do not sip.  This drink was given to you during your hospital  pre-op appointment visit. Nothing else to drink after completing the  Pre-Surgery Clear Ensure.          If you have questions, please contact your surgeon's  office.   FOLLOW BOWEL PREP AND ANY ADDITIONAL PRE OP INSTRUCTIONS YOU RECEIVED FROM YOUR SURGEON'S OFFICE!!!     Oral Hygiene is also important to reduce your risk of infection.                                    Remember - BRUSH YOUR TEETH THE MORNING OF SURGERY WITH YOUR REGULAR TOOTHPASTE  DENTURES WILL BE REMOVED PRIOR TO SURGERY PLEASE DO NOT APPLY Poly grip OR ADHESIVES!!!   Stop all vitamins and herbal supplements 7 days before surgery.  Do not take Eliquis  after 05/31/24.   Take these medicines the morning of surgery with A SIP OF WATER: Diltiazem , Inhalers, Prevacid  Bring CPAP mask and tubing day of surgery.                              You may not have any metal on your body including, jewelry, and body piercing             Do not wear lotions, powders, cologne, or deodorant              Men may shave face and neck.   Do not bring valuables to the hospital. [facility] IS NOT             RESPONSIBLE   FOR VALUABLES.   Contacts, glasses, dentures or bridgework may not be worn into surgery.  DO NOT BRING YOUR HOME MEDICATIONS TO THE HOSPITAL. PHARMACY WILL DISPENSE MEDICATIONS LISTED ON YOUR MEDICATION LIST TO YOU DURING YOUR ADMISSION IN THE HOSPITAL!    Patients discharged on the day of surgery will not be allowed to drive home.  Someone NEEDS to stay with you for the first 24 hours after anesthesia.   Special Instructions: Bring a copy of your healthcare power of attorney and living will documents the day of surgery if you haven't scanned them before.              Please read over the following fact sheets you were given: IF YOU HAVE QUESTIONS ABOUT YOUR PRE-OP INSTRUCTIONS PLEASE CALL [name]    If you received a COVID test during your pre-op visit  it is requested that you wear a mask when out in public, stay away from anyone that may not be feeling well and notify your surgeon if you develop symptoms. If you test positive for Covid or have been  in contact with anyone that has tested positive in the last 10 days please notify you surgeon.    [facility] - Preparing for Surgery Before surgery, you can play an important role.  Because skin is not sterile, your skin needs to be as free of germs as possible.  You can reduce the number of germs on your skin by washing with CHG (chlorahexidine gluconate) soap before surgery.  CHG is an antiseptic cleaner which kills germs and bonds with the skin to continue killing germs even after washing. Please DO NOT use if you have an allergy  to CHG or antibacterial soaps.  If your skin becomes reddened/irritated stop using the CHG and inform your nurse when you arrive at Short Stay. Do not shave (including legs and underarms) for at least 48 hours prior to the first CHG shower.  You may shave your face/neck.  Please follow these instructions carefully:  1.  Shower with CHG Soap the night before surgery and the  morning of surgery.  2.  If you choose to wash your hair, wash your hair first as usual with your normal  shampoo.  3.  After you shampoo, rinse your hair and body thoroughly to remove the shampoo.                             4.  Use CHG as you would any other liquid soap.  You can apply chg directly to the skin and wash.  Gently with a scrungie or clean washcloth.  5.  Apply the CHG Soap to your body ONLY FROM THE NECK DOWN.   Do   not use on face/ open                           Wound or open sores. Avoid contact with eyes, ears mouth and   genitals (private parts).                       Wash face,  Genitals (private parts) with your normal soap.             6.  Wash thoroughly, paying special attention to the area where your    surgery  will be performed.  7.  Thoroughly rinse your body with warm water from the neck down.  8.  DO NOT shower/wash with your normal soap after using and rinsing off the CHG Soap.                9.  Pat yourself dry with a clean towel.            10.  Wear clean  pajamas.            11.  Place clean sheets on your bed the night of your first shower and do not  sleep with pets. Day of Surgery : Do not apply any lotions/deodorants the morning of surgery.  Please wear clean clothes to the hospital/surgery center.  FAILURE TO FOLLOW THESE INSTRUCTIONS MAY RESULT IN THE CANCELLATION OF YOUR SURGERY  PATIENT SIGNATURE_________________________________  NURSE SIGNATURE__________________________________  ________________________________________________________________________   [name]  An incentive spirometer is a tool that can help keep your lungs clear and active. This tool measures how well you are filling your lungs with each breath. Taking long deep breaths may help reverse or decrease the chance of developing breathing (pulmonary) problems (especially infection) following: A long period of time when you are unable to move or be active. BEFORE THE PROCEDURE  If the spirometer includes an indicator to show your best effort, your nurse or respiratory therapist will set it to a desired goal. If possible, sit up straight or lean slightly forward. Try not to slouch. Hold the incentive spirometer in an upright position. INSTRUCTIONS FOR USE  Sit on the edge of your bed if possible, or sit up as far as you can in bed or on a chair. Hold the incentive spirometer in an upright position. Breathe out normally. Place the mouthpiece in your mouth and seal your lips tightly around it. Breathe in slowly and as deeply as possible, raising the piston or the ball toward the top of the column. Hold your breath for 3-5 seconds or for as long as possible. Allow the piston or ball to fall to the bottom of the column. Remove the mouthpiece from your mouth and breathe out normally. Rest for a few seconds and repeat Steps 1 through 7 at least 10 times every 1-2 hours when you are awake. Take your time and take a few normal breaths between deep breaths. The  spirometer may include an indicator to show your best effort. Use the indicator as a goal to work toward during each repetition. After each set of 10 deep breaths, practice coughing to be sure your lungs are clear. If you have an incision (the cut made at the time of surgery), support your incision when coughing by placing a pillow or rolled up towels firmly  against it. Once you are able to get out of bed, walk around indoors and cough well. You may stop using the incentive spirometer when instructed by your caregiver.  RISKS AND COMPLICATIONS Take your time so you do not get dizzy or light-headed. If you are in pain, you may need to take or ask for pain medication before doing incentive spirometry. It is harder to take a deep breath if you are having pain. AFTER USE Rest and breathe slowly and easily. It can be helpful to keep track of a log of your progress. Your caregiver can provide you with a simple table to help with this. If you are using the spirometer at home, follow these instructions: SEEK MEDICAL CARE IF:  You are having difficultly using the spirometer. You have trouble using the spirometer as often as instructed. Your pain medication is not giving enough relief while using the spirometer. You develop fever of 100.5 F (38.1 C) or higher. SEEK IMMEDIATE MEDICAL CARE IF:  You cough up bloody sputum that had not been present before. You develop fever of 102 F (38.9 C) or greater. You develop worsening pain at or near the incision site. MAKE SURE YOU:  Understand these instructions. Will watch your condition. Will get help right away if you are not doing well or get worse. Document Released: 04/02/2007 Document Revised: 02/12/2012 Document Reviewed: 06/03/2007 [facility] Patient Information 2014 [place], [place].   ________________________________________________________________________

## 2024-05-21 NOTE — Progress Notes (Addendum)
 COVID Vaccine Completed: yes  Date of COVID positive in last 90 days:  PCP - Charlene Conners, MD Cardiologist - Carson Clara, MD Electrophysiologist- Harvie Liner, MD  Cardiac clearance by Palmer Bobo, NP 05/08/24 in Epic   Chest x-ray - n/a EKG - 04/24/24 Epic Stress Test - more than 5 years ago per pt ECHO - 09/18/23 Epic Cardiac Cath - yes 5 years ago per pt, in [facility] Pacemaker/ICD device last checked: n/a Spinal Cord Stimulator: n/a  Bowel Prep - no  Sleep Study - yes CPAP - yes every night   Fasting Blood Sugar - n/a Checks Blood Sugar _____ times a day  Last dose of GLP1 agonist-  N/A GLP1 instructions:  Hold 7 days before surgery    Last dose of SGLT-2 inhibitors-  N/A SGLT-2 instructions:  Hold 3 days before surgery    Blood Thinner Instructions:  Eliquis , hold 2 days Aspirin Instructions: Last Dose: 05/31/24 1900  Activity level: Can go up a flight of stairs and perform activities of daily living without stopping and without symptoms of chest pain or shortness of breath. Slow with stairs but can do.   Anesthesia review: a fib, HTN, OSA, DVT  Patient denies shortness of breath, fever, cough and chest pain at PAT appointment  Patient verbalized understanding of instructions that were given to them at the PAT appointment. Patient was also instructed that they will need to review over the PAT instructions again at home before surgery.

## 2024-05-22 ENCOUNTER — Other Ambulatory Visit: Payer: Self-pay

## 2024-05-22 ENCOUNTER — Encounter (HOSPITAL_COMMUNITY)
Admission: RE | Admit: 2024-05-22 | Discharge: 2024-05-22 | Disposition: A | Discharge status: Home / Self Care | Source: Ambulatory Visit | Attending: Orthopaedic Surgery | Admitting: Orthopaedic Surgery

## 2024-05-22 ENCOUNTER — Encounter (HOSPITAL_COMMUNITY): Payer: Self-pay

## 2024-05-22 VITALS — BP 116/68 | HR 56 | Temp 98.2°F | Resp 16 | Ht 72.0 in | Wt 227.0 lb

## 2024-05-22 DIAGNOSIS — I2510 Atherosclerotic heart disease of native coronary artery without angina pectoris: Secondary | ICD-10-CM | POA: Diagnosis not present

## 2024-05-22 DIAGNOSIS — Z01812 Encounter for preprocedural laboratory examination: Secondary | ICD-10-CM | POA: Diagnosis not present

## 2024-05-22 DIAGNOSIS — I700 Atherosclerosis of aorta: Secondary | ICD-10-CM | POA: Diagnosis not present

## 2024-05-22 DIAGNOSIS — Z1331 Encounter for screening for depression: Secondary | ICD-10-CM | POA: Diagnosis not present

## 2024-05-22 DIAGNOSIS — I480 Paroxysmal atrial fibrillation: Secondary | ICD-10-CM | POA: Insufficient documentation

## 2024-05-22 DIAGNOSIS — E291 Testicular hypofunction: Secondary | ICD-10-CM | POA: Diagnosis not present

## 2024-05-22 DIAGNOSIS — D6869 Other thrombophilia: Secondary | ICD-10-CM | POA: Diagnosis not present

## 2024-05-22 DIAGNOSIS — R7303 Prediabetes: Secondary | ICD-10-CM | POA: Diagnosis not present

## 2024-05-22 DIAGNOSIS — I7121 Aneurysm of the ascending aorta, without rupture: Secondary | ICD-10-CM | POA: Diagnosis not present

## 2024-05-22 DIAGNOSIS — Z0000 Encounter for general adult medical examination without abnormal findings: Secondary | ICD-10-CM | POA: Diagnosis not present

## 2024-05-22 DIAGNOSIS — G4733 Obstructive sleep apnea (adult) (pediatric): Secondary | ICD-10-CM | POA: Diagnosis not present

## 2024-05-22 DIAGNOSIS — M48061 Spinal stenosis, lumbar region without neurogenic claudication: Secondary | ICD-10-CM | POA: Diagnosis not present

## 2024-05-22 DIAGNOSIS — I10 Essential (primary) hypertension: Secondary | ICD-10-CM | POA: Diagnosis not present

## 2024-05-22 DIAGNOSIS — Q678 Other congenital deformities of chest: Secondary | ICD-10-CM | POA: Diagnosis not present

## 2024-05-22 LAB — CBC
HCT: 41 % (ref 39.0–52.0)
Hemoglobin: 14.6 g/dL (ref 13.0–17.0)
MCH: 31.6 pg (ref 26.0–34.0)
MCHC: 35.6 g/dL (ref 30.0–36.0)
MCV: 88.7 fL (ref 80.0–100.0)
Platelets: 215 10*3/uL (ref 150–400)
RBC: 4.62 MIL/uL (ref 4.22–5.81)
RDW: 13.4 % (ref 11.5–15.5)
WBC: 4.9 10*3/uL (ref 4.0–10.5)
nRBC: 0 % (ref 0.0–0.2)

## 2024-05-22 NOTE — Progress Notes (Signed)
 Lab called to reports BMP clotted. Put in new order for DOS

## 2024-05-23 DIAGNOSIS — R7303 Prediabetes: Secondary | ICD-10-CM | POA: Diagnosis not present

## 2024-05-23 DIAGNOSIS — E291 Testicular hypofunction: Secondary | ICD-10-CM | POA: Diagnosis not present

## 2024-05-27 DIAGNOSIS — Z85828 Personal history of other malignant neoplasm of skin: Secondary | ICD-10-CM | POA: Diagnosis not present

## 2024-05-27 DIAGNOSIS — L720 Epidermal cyst: Secondary | ICD-10-CM | POA: Diagnosis not present

## 2024-05-27 DIAGNOSIS — L820 Inflamed seborrheic keratosis: Secondary | ICD-10-CM | POA: Diagnosis not present

## 2024-05-27 NOTE — Progress Notes (Signed)
 Anesthesia Chart Review   Case: [id number] Date/Time: 06/03/24 1400   Procedure: ARTHROSCOPY, KNEE, WITH MEDIAL MENISCECTOMY (Right: Knee) - RIGHT KNEE ARTHOSCOPY WITH MEDIAL MENISCECTOMY   Anesthesia type: Choice   Pre-op diagnosis: RIGHT KNEE MEDIAL MENISCUS TEAR AND DEGENERATIVE JOINT DISEASE   Location: [facility] ROOM 08 / [facility]   Surgeons: Sheril Coy, MD       DISCUSSION:73 y.o. former smoker with h/o HTN, sleep apnea with CPAP, atrial fibrillation, right knee djd scheduled for above procedure 06/03/2024 with Dr. Coy Sheril.   Per cardiology preoperative evaluation 05/08/2024, Chart reviewed as part of pre-operative protocol coverage. Given past medical history and time since last visit, based on ACC/AHA guidelines, [name] is at acceptable risk for the planned procedure without further cardiovascular testing.    Per Dr. Kate: No further cardiac workup recommended prior to procedure.  Can hold Eliquis  2 days prior to procedure  Unprovoked DVT in the past. He stopped the blood thinner in the past and reports he had another blood clot. He is hesitant to hold Eliquis  longer than the 2 day hold recommended by cardiology and would prefer a two day hold and no spinal.  VS: BP 116/68   Pulse (!) 56   Temp 36.8 C (Oral)   Resp 16   Ht 6' (1.829 m)   Wt 103 kg   SpO2 98%   BMI 30.79 kg/m   PROVIDERS: Charlott Dorn LABOR, MD is PCP    Primary Cardiologist: Lonni LITTIE Kate, MD  LABS: Labs reviewed: Acceptable for surgery. (all labs ordered are listed, but only abnormal results are displayed)  Labs Reviewed  CBC     IMAGES:   EKG:   CV: Echo 09/18/2023 1. Left ventricular ejection fraction, by estimation, is 60 to 65%. The  left ventricle has normal function. The left ventricle has no regional  wall motion abnormalities. There is moderate left ventricular hypertrophy.  Left ventricular diastolic  parameters are consistent with Grade II diastolic  dysfunction  (pseudonormalization).   2. Right ventricular systolic function is normal. The right ventricular  size is normal. There is normal pulmonary artery systolic pressure.   3. Left atrial size was mildly dilated.   4. The mitral valve is normal in structure. Trivial mitral valve  regurgitation. No evidence of mitral stenosis.   5. The aortic valve is tricuspid. There is mild calcification of the  aortic valve. There is mild thickening of the aortic valve. Aortic valve  regurgitation is mild. No aortic stenosis is present.   6. Aortic dilatation noted. There is mild dilatation of the ascending  aorta, measuring 42 mm.   7. The inferior vena cava is normal in size with greater than 50%  respiratory variability, suggesting right atrial pressure of 3 mmHg.   Past Medical History:  Diagnosis Date   Arthritis    arthritis -back   Atrial fibrillation (HCC)    a. diagnosed 09/2016 - started on Cardizem  CD and Flecainide . Continue Coumadin  for anticoagulation.   Cancer [facility])    cancer Prostate- surgery only   Chronic anticoagulation 09/10/2016   History of DVT (deep vein thrombosis)    History of DVT of lower extremity    left leg has some residual crculation issues   History of prostate cancer    Hyperlipidemia 09/09/2018   Hypertension    Hypertensive heart disease without CHF 09/29/2016   Obesity (BMI 30-39.9)    Paroxysmal atrial fibrillation (HCC)    CHA2DS2VASC score 2  Sleep apnea    a. uses CPAP   Sleep apnea in adult     Past Surgical History:  Procedure Laterality Date   ATRIAL FIBRILLATION ABLATION N/A 11/25/2020   Procedure: ATRIAL FIBRILLATION ABLATION;  Surgeon: Cindie Ole DASEN, MD;  Location: [facility] INVASIVE CV LAB;  Service: Cardiovascular;  Laterality: N/A;   BALLOON DILATION N/A 10/12/2015   Procedure: BALLOON DILATION;  Surgeon: [facility] MARLA Louder, MD;  Location: [facility] ENDOS[facility];  Service: Endoscopy;  Laterality: N/A;   CARDIAC CATHETERIZATION     in [place]    COLONOSCOPY W/ POLYPECTOMY     x2 colon polyps found   COLONOSCOPY WITH PROPOFOL  N/A 10/12/2015   Procedure: COLONOSCOPY WITH PROPOFOL ;  Surgeon: [facility] MARLA Louder, MD;  Location: [facility];  Service: Endoscopy;  Laterality: N/A;   ESOPHAGOGASTRODUODENOSCOPY (EGD) WITH PROPOFOL  N/A 10/12/2015   Procedure: ESOPHAGOGASTRODUODENOSCOPY (EGD) WITH PROPOFOL ;  Surgeon: [facility] MARLA Louder, MD;  Location: [facility];  Service: Endoscopy;  Laterality: N/A;   SHOULDER ARTHROTOMY     x2 procedures- 1 open, 1 scope.   TONSILLECTOMY     TRANSURETHRAL RESECTION OF PROSTATE     WISDOM TOOTH EXTRACTION      MEDICATIONS:  apixaban  (ELIQUIS ) 5 MG TABS tablet   Calcium  Carbonate-Vitamin D  500-5 MG-MCG TABS   Cholecalciferol  (VITAMIN D ) 50 MCG (2000 UT) tablet   Coenzyme Q10 (COQ-10) 200 MG CAPS   diltiazem  (DILT-XR) 180 MG 24 hr capsule   folic acid  (FOLVITE ) 1 MG tablet   ipratropium (ATROVENT) 0.06 % nasal spray   lansoprazole (PREVACID) 30 MG capsule   lisinopril -hydrochlorothiazide  (ZESTORETIC ) 20-25 MG tablet   MAGNESIUM CITRATE PO   Methylcellulose, Laxative, (CITRUCEL PO)   potassium chloride  SA (K-DUR,KLOR-CON ) 20 MEQ tablet   REPATHA  SURECLICK 140 MG/ML SOAJ   Testosterone  20.25 MG/ACT (1.62%) GEL   TURMERIC PO   vitamin B-12 (CYANOCOBALAMIN ) 1000 MCG tablet   No current facility-administered medications for this encounter.   Harlene Hoots Ward, PA-C [facility] Pre-Surgical Testing [phone]

## 2024-05-29 NOTE — Anesthesia Preprocedure Evaluation (Addendum)
 Anesthesia Evaluation  Patient identified by MRN, date of birth, ID band Patient awake    Reviewed: Allergy  & Precautions, NPO status , Patient's Chart, lab work & pertinent test results  History of Anesthesia Complications Negative for: history of anesthetic complications  Airway Mallampati: II  TM Distance: >3 FB Neck ROM: Full    Dental  (+) Dental Advisory Given   Pulmonary sleep apnea and Continuous Positive Airway Pressure Ventilation , former smoker   Pulmonary exam normal        Cardiovascular hypertension, Pt. on medications + DVT  Normal cardiovascular exam+ dysrhythmias Atrial Fibrillation + Valvular Problems/Murmurs AI  Rhythm:Regular  Echo 09/2023  1. Left ventricular ejection fraction, by estimation, is 60 to 65%. The left ventricle has normal function. The left ventricle has no regional wall motion abnormalities. There is moderate left ventricular hypertrophy. Left ventricular diastolic parameters are consistent with Grade II diastolic dysfunction (pseudonormalization).   2. Right ventricular systolic function is normal. The right ventricular size is normal. There is normal pulmonary artery systolic pressure.   3. Left atrial size was mildly dilated.   4. The mitral valve is normal in structure. Trivial mitral valve regurgitation. No evidence of mitral stenosis.   5. The aortic valve is tricuspid. There is mild calcification of the aortic valve. There is mild thickening of the aortic valve. Aortic valve regurgitation is mild. No aortic stenosis is present.   6. Aortic dilatation noted. There is mild dilatation of the ascending aorta, measuring 42 mm.   7. The inferior vena cava is normal in size with greater than 50% respiratory variability, suggesting right atrial pressure of 3 mmHg.       1. Left ventricular ejection fraction, by estimation, is 60 to 65%. The left ventricle has normal function. The left ventricle has  no regional wall motion abnormalities. There is mild concentric left ventricular hypertrophy. Left ventricular diastolic  parameters were normal.  2. Right ventricular systolic function is normal. The right ventricular size is normal. There is normal pulmonary artery systolic pressure. The estimated right ventricular systolic pressure is 19.0 mmHg.  3. The mitral valve is grossly normal. Trivial mitral valve regurgitation. No evidence of mitral stenosis.  4. The aortic valve is tricuspid. There is moderate calcification of the aortic valve. There is moderate thickening of the aortic valve. Aortic valve regurgitation is mild. Mild to moderate aortic valve sclerosis/calcification is present, without any  evidence of aortic stenosis.  5. There is mild dilatation of the ascending aorta, measuring 42 mm.  6. The inferior vena cava is normal in size with greater than 50% respiratory variability, suggesting right atrial pressure of 3 mmHg.   Comparison(s): Changes from prior study are noted. EF normal 60-65%. Normal LA. Mild AI.   Chronic anticoagulation for DVT   Neuro/Psych negative neurological ROS     GI/Hepatic negative GI ROS, Neg liver ROS,,,  Endo/Other  negative endocrine ROS    Renal/GU negative Renal ROS     Musculoskeletal  (+) Arthritis ,    Abdominal   Peds  Hematology negative hematology ROS (+)   Anesthesia Other Findings   Reproductive/Obstetrics                              Anesthesia Physical Anesthesia Plan  ASA: 3  Anesthesia Plan: General   Post-op Pain Management: Tylenol  PO (pre-op)*   Induction: Intravenous  PONV Risk Score and Plan: 2 and Ondansetron , Dexamethasone   and Treatment may vary due to age or medical condition  Airway Management Planned: LMA  Additional Equipment:   Intra-op Plan:   Post-operative Plan: Extubation in OR  Informed Consent: I have reviewed the patients History and Physical, chart, labs and  discussed the procedure including the risks, benefits and alternatives for the proposed anesthesia with the patient or authorized representative who has indicated his/her understanding and acceptance.     Dental advisory given  Plan Discussed with: CRNA  Anesthesia Plan Comments: (See PAT note 05/22/2024)        Anesthesia Quick Evaluation

## 2024-06-02 NOTE — H&P (Signed)
 [name] is an 73 y.o. male.   Chief Complaint: Right knee pain HPI: [name] is here today for follow-up of his right knee.  He is now back from [place]  and continues some terrible medial knee pain.  It will catch and lock on him and he feels like his leg is getting of the way.  He knows he has some arthritis but he is hoping for an arthroscopy to settle things down to make it better.    Radiographs:  X-rays that were ordered, performed, and interpreted by me today included 4 views of both knees.  These films show some medial compartment narrowing but no bone-on-bone DJD is noted.  Past Medical History:  Diagnosis Date   Arthritis    arthritis -back   Atrial fibrillation (HCC)    a. diagnosed 09/2016 - started on Cardizem  CD and Flecainide . Continue Coumadin  for anticoagulation.   Cancer [facility])    cancer Prostate- surgery only   Chronic anticoagulation 09/10/2016   History of DVT (deep vein thrombosis)    History of DVT of lower extremity    left leg has some residual crculation issues   History of prostate cancer    Hyperlipidemia 09/09/2018   Hypertension    Hypertensive heart disease without CHF 09/29/2016   Obesity (BMI 30-39.9)    Paroxysmal atrial fibrillation (HCC)    CHA2DS2VASC score 2   Sleep apnea    a. uses CPAP   Sleep apnea in adult     Past Surgical History:  Procedure Laterality Date   ATRIAL FIBRILLATION ABLATION N/A 11/25/2020   Procedure: ATRIAL FIBRILLATION ABLATION;  Surgeon: Cindie Ole DASEN, MD;  Location: [facility];  Service: Cardiovascular;  Laterality: N/A;   BALLOON DILATION N/A 10/12/2015   Procedure: BALLOON DILATION;  Surgeon: Gladis MARLA Louder, MD;  Location: [facility] ENDOSCOPY;  Service: Endoscopy;  Laterality: N/A;   CARDIAC CATHETERIZATION     in [place]   COLONOSCOPY W/ POLYPECTOMY     x2 colon polyps found   COLONOSCOPY WITH PROPOFOL  N/A 10/12/2015   Procedure: COLONOSCOPY WITH PROPOFOL ;  Surgeon: Gladis MARLA Louder, MD;  Location: [facility]  ENDOSCOPY;  Service: Endoscopy;  Laterality: N/A;   ESOPHAGOGASTRODUODENOSCOPY (EGD) WITH PROPOFOL  N/A 10/12/2015   Procedure: ESOPHAGOGASTRODUODENOSCOPY (EGD) WITH PROPOFOL ;  Surgeon: Gladis MARLA Louder, MD;  Location: [facility] ENDOSCOPY;  Service: Endoscopy;  Laterality: N/A;   SHOULDER ARTHROTOMY     x2 procedures- 1 open, 1 scope.   TONSILLECTOMY     TRANSURETHRAL RESECTION OF PROSTATE     WISDOM TOOTH EXTRACTION      Family History  Problem Relation Age of Onset   Allergic rhinitis Sister    Hypertension Sister    Allergic rhinitis Brother    Prostate cancer Brother    Hypertension Brother    Social History:  reports that he quit smoking about 25 years ago. His smoking use included cigarettes. He started smoking about 40 years ago. He has never used smokeless tobacco. He reports that he does not drink alcohol and does not use drugs.  Allergies:  Allergies  Allergen Reactions   Shellfish Allergy  Diarrhea and Nausea And Vomiting   Lactose Diarrhea   Lipitor [Atorvastatin] Other (See Comments)   Multaq  [Dronedarone ]     Terrible burning in his stomach   Zonisamide Other (See Comments)    Dizziness   Crestor  [Rosuvastatin ] Other (See Comments)    Muscle aches    No medications prior to admission.    No results found  for this or any previous visit (from the past 48 hours). No results found.  Review of Systems  Musculoskeletal:  Positive for arthralgias.       Right knee  All other systems reviewed and are negative.   There were no vitals taken for this visit. Physical Exam Constitutional:      Appearance: Normal appearance.  HENT:     Head: Normocephalic and atraumatic.     Nose: Nose normal.     Mouth/Throat:     Pharynx: Oropharynx is clear.   Eyes:     Extraocular Movements: Extraocular movements intact.   Pulmonary:     Effort: Pulmonary effort is normal.  Abdominal:     Palpations: Abdomen is soft.   Musculoskeletal:     Cervical back: Normal range of  motion.     Comments: Examination of the right knee shows range of motion from 0-120 is of flexion.  He has pain at end action and extension.  He has significant tenderness palpation along his medial joint line.  Is otherwise nontender to palpation throughout.  Ligaments are stable.  Calf is soft and nontender.  He is neurovascularly intact distally.   Skin:    General: Skin is warm and dry.   Neurological:     General: No focal deficit present.     Mental Status: He is alert and oriented to person, place, and time.   Psychiatric:        Mood and Affect: Mood normal.        Behavior: Behavior normal.        Thought Content: Thought content normal.        Judgment: Judgment normal.      Assessment/Plan Assessment:   Right knee posterior horn radial tear of medial meniscus with subchondral stress reaction injected with cortisone 09/17/23  Plan: We had a long discussion with the patient.  We did inform him that he has some degenerative changes in his knee, but he does have a rather prominent meniscus tear.  We talked about a better, but not new knee scope. I reviewed risks of anesthesia and infection as well as potential for DVT related to a knee arthroscopy.  I've stressed the importance of some postoperative physical therapy to optimize results and we will try to set up an appointment.  Two to four weeks for recovery would be typical but that is a little variable.    Prentice Mt Sascha Baugher, PA-C 06/02/2024, 11:29 AM

## 2024-06-03 ENCOUNTER — Encounter (HOSPITAL_COMMUNITY): Payer: Self-pay | Admitting: Orthopaedic Surgery

## 2024-06-03 ENCOUNTER — Ambulatory Visit (HOSPITAL_COMMUNITY)

## 2024-06-03 ENCOUNTER — Ambulatory Visit (HOSPITAL_COMMUNITY): Payer: Self-pay | Admitting: Physician Assistant

## 2024-06-03 ENCOUNTER — Encounter (HOSPITAL_COMMUNITY)
Admission: RE | Disposition: A | Discharge status: Home / Self Care | Payer: Self-pay | Source: Ambulatory Visit | Attending: Orthopaedic Surgery

## 2024-06-03 ENCOUNTER — Ambulatory Visit (HOSPITAL_COMMUNITY)
Admission: RE | Admit: 2024-06-03 | Discharge: 2024-06-03 | Disposition: A | Discharge status: Home / Self Care | Source: Ambulatory Visit | Attending: Orthopaedic Surgery | Admitting: Orthopaedic Surgery

## 2024-06-03 DIAGNOSIS — Z87891 Personal history of nicotine dependence: Secondary | ICD-10-CM | POA: Insufficient documentation

## 2024-06-03 DIAGNOSIS — S83241A Other tear of medial meniscus, current injury, right knee, initial encounter: Secondary | ICD-10-CM

## 2024-06-03 DIAGNOSIS — I480 Paroxysmal atrial fibrillation: Secondary | ICD-10-CM | POA: Insufficient documentation

## 2024-06-03 DIAGNOSIS — Z79899 Other long term (current) drug therapy: Secondary | ICD-10-CM | POA: Diagnosis not present

## 2024-06-03 DIAGNOSIS — I10 Essential (primary) hypertension: Secondary | ICD-10-CM

## 2024-06-03 DIAGNOSIS — G4730 Sleep apnea, unspecified: Secondary | ICD-10-CM | POA: Insufficient documentation

## 2024-06-03 DIAGNOSIS — M2241 Chondromalacia patellae, right knee: Secondary | ICD-10-CM | POA: Diagnosis not present

## 2024-06-03 DIAGNOSIS — X58XXXA Exposure to other specified factors, initial encounter: Secondary | ICD-10-CM | POA: Insufficient documentation

## 2024-06-03 DIAGNOSIS — Z7901 Long term (current) use of anticoagulants: Secondary | ICD-10-CM | POA: Diagnosis not present

## 2024-06-03 DIAGNOSIS — M25561 Pain in right knee: Secondary | ICD-10-CM

## 2024-06-03 DIAGNOSIS — Z86718 Personal history of other venous thrombosis and embolism: Secondary | ICD-10-CM | POA: Diagnosis not present

## 2024-06-03 DIAGNOSIS — I119 Hypertensive heart disease without heart failure: Secondary | ICD-10-CM | POA: Diagnosis not present

## 2024-06-03 DIAGNOSIS — I4891 Unspecified atrial fibrillation: Secondary | ICD-10-CM | POA: Diagnosis not present

## 2024-06-03 HISTORY — PX: KNEE ARTHROSCOPY WITH MEDIAL MENISECTOMY: SHX5651

## 2024-06-03 LAB — BASIC METABOLIC PANEL WITH GFR
Anion gap: 9 (ref 5–15)
BUN: 13 mg/dL (ref 8–23)
CO2: 23 mmol/L (ref 22–32)
Calcium: 8.7 mg/dL — ABNORMAL LOW (ref 8.9–10.3)
Chloride: 100 mmol/L (ref 98–111)
Creatinine, Ser: 0.82 mg/dL (ref 0.61–1.24)
GFR, Estimated: >60 mL/min (ref >60)
Glucose, Bld: 176 mg/dL — ABNORMAL HIGH (ref 70–99)
Potassium: 3.5 mmol/L (ref 3.5–5.1)
Sodium: 132 mmol/L — ABNORMAL LOW (ref 135–145)

## 2024-06-03 SURGERY — ARTHROSCOPY, KNEE, WITH MEDIAL MENISCECTOMY
Anesthesia: General | Site: Knee | Laterality: Right

## 2024-06-03 MED ORDER — LIDOCAINE HCL (PF) 2 % IJ SOLN
INTRAMUSCULAR | Status: AC
  Filled 2024-06-03: qty 5

## 2024-06-03 MED ORDER — EPHEDRINE SULFATE-NACL 50-0.9 MG/10ML-% IV SOSY
PREFILLED_SYRINGE | INTRAVENOUS | Status: DC | PRN
  Administered 2024-06-03: 5 mg via INTRAVENOUS

## 2024-06-03 MED ORDER — DEXAMETHASONE SODIUM PHOSPHATE 4 MG/ML IJ SOLN
INTRAMUSCULAR | Status: DC | PRN
  Administered 2024-06-03: 8 mg via INTRAVENOUS

## 2024-06-03 MED ORDER — BUPIVACAINE-EPINEPHRINE 0.5% -1:200000 IJ SOLN
INTRAMUSCULAR | Status: DC | PRN
  Administered 2024-06-03: 20 mL

## 2024-06-03 MED ORDER — PROPOFOL 1000 MG/100ML IV EMUL
INTRAVENOUS | Status: AC
  Filled 2024-06-03: qty 100

## 2024-06-03 MED ORDER — SODIUM CHLORIDE (PF) 0.9 % IJ SOLN
INTRAMUSCULAR | Status: AC
  Filled 2024-06-03: qty 10

## 2024-06-03 MED ORDER — FENTANYL CITRATE PF 50 MCG/ML IJ SOSY: 25.0000 ug | PREFILLED_SYRINGE | INTRAMUSCULAR | Status: DC | PRN

## 2024-06-03 MED ORDER — DEXAMETHASONE SODIUM PHOSPHATE 10 MG/ML IJ SOLN
INTRAMUSCULAR | Status: AC
  Filled 2024-06-03: qty 1

## 2024-06-03 MED ORDER — ACETAMINOPHEN 500 MG PO TABS
ORAL_TABLET | ORAL | Status: AC
  Administered 2024-06-03: 1000 mg via ORAL
  Filled 2024-06-03: qty 2

## 2024-06-03 MED ORDER — PROPOFOL 500 MG/50ML IV EMUL
INTRAVENOUS | Status: DC | PRN
  Administered 2024-06-03: 125 ug/kg/min via INTRAVENOUS

## 2024-06-03 MED ORDER — HYDROMORPHONE HCL 1 MG/ML IJ SOLN
INTRAMUSCULAR | Status: DC | PRN
  Administered 2024-06-03: .2 mg via INTRAVENOUS
  Administered 2024-06-03: .6 mg via INTRAVENOUS

## 2024-06-03 MED ORDER — EPINEPHRINE PF 1 MG/ML IJ SOLN
INTRAMUSCULAR | Status: AC
  Filled 2024-06-03: qty 1

## 2024-06-03 MED ORDER — CEFAZOLIN SODIUM-DEXTROSE 2-4 GM/100ML-% IV SOLN
2.0000 g | INTRAVENOUS | Status: AC
  Administered 2024-06-03: 2 g via INTRAVENOUS
  Filled 2024-06-03: qty 100

## 2024-06-03 MED ORDER — PHENYLEPHRINE 80 MCG/ML (10ML) SYRINGE FOR IV PUSH (FOR BLOOD PRESSURE SUPPORT)
PREFILLED_SYRINGE | INTRAVENOUS | Status: DC | PRN
  Administered 2024-06-03 (×2): 80 ug via INTRAVENOUS

## 2024-06-03 MED ORDER — PROPOFOL 10 MG/ML IV BOLUS
INTRAVENOUS | Status: AC
  Filled 2024-06-03: qty 20

## 2024-06-03 MED ORDER — ACETAMINOPHEN 500 MG PO TABS: 1000.0000 mg | ORAL_TABLET | Freq: Once | ORAL | Status: AC

## 2024-06-03 MED ORDER — CHLORHEXIDINE GLUCONATE 0.12 % MT SOLN
15.0000 mL | Freq: Once | OROMUCOSAL | Status: AC
  Administered 2024-06-03: 15 mL via OROMUCOSAL

## 2024-06-03 MED ORDER — PROPOFOL 10 MG/ML IV BOLUS
INTRAVENOUS | Status: DC | PRN
  Administered 2024-06-03: 150 mg via INTRAVENOUS

## 2024-06-03 MED ORDER — SODIUM CHLORIDE 0.9 % IR SOLN
Status: DC | PRN
  Administered 2024-06-03: 3000 mL

## 2024-06-03 MED ORDER — ONDANSETRON HCL 4 MG/2ML IJ SOLN
INTRAMUSCULAR | Status: AC
Start: 2024-06-03 — End: 2024-06-03
  Filled 2024-06-03: qty 2

## 2024-06-03 MED ORDER — ONDANSETRON HCL 4 MG/2ML IJ SOLN
INTRAMUSCULAR | Status: DC | PRN
  Administered 2024-06-03: 4 mg via INTRAVENOUS

## 2024-06-03 MED ORDER — LACTATED RINGERS IV SOLN: INTRAVENOUS | Status: DC

## 2024-06-03 MED ORDER — ORAL CARE MOUTH RINSE: 15.0000 mL | Freq: Once | OROMUCOSAL | Status: AC

## 2024-06-03 MED ORDER — LIDOCAINE HCL (CARDIAC) PF 100 MG/5ML IV SOSY
PREFILLED_SYRINGE | INTRAVENOUS | Status: DC | PRN
  Administered 2024-06-03: 100 mg via INTRAVENOUS

## 2024-06-03 MED ORDER — HYDROMORPHONE HCL 2 MG/ML IJ SOLN
INTRAMUSCULAR | Status: AC
Start: 2024-06-03 — End: 2024-06-03
  Filled 2024-06-03: qty 1

## 2024-06-03 MED ORDER — PHENYLEPHRINE 80 MCG/ML (10ML) SYRINGE FOR IV PUSH (FOR BLOOD PRESSURE SUPPORT)
PREFILLED_SYRINGE | INTRAVENOUS | Status: AC
  Filled 2024-06-03: qty 10

## 2024-06-03 MED ORDER — HYDROCODONE-ACETAMINOPHEN 5-325 MG PO TABS: 1.0000 | ORAL_TABLET | Freq: Four times a day (QID) | ORAL | 0 refills | Status: DC | PRN

## 2024-06-03 MED ORDER — DROPERIDOL 2.5 MG/ML IJ SOLN: 0.6250 mg | Freq: Once | INTRAMUSCULAR | Status: DC | PRN

## 2024-06-03 MED ORDER — POVIDONE-IODINE 7.5 % EX SOLN: Freq: Once | CUTANEOUS | Status: DC

## 2024-06-03 SURGICAL SUPPLY — 35 items
BAG COUNTER SPONGE SURGICOUNT (BAG) IMPLANT
BLADE EXCALIBUR 4.0X13 (MISCELLANEOUS) IMPLANT
BNDG ELASTIC 6INX 5YD STR LF (GAUZE/BANDAGES/DRESSINGS) ×1 IMPLANT
BNDG ELASTIC 6X10 VLCR STRL LF (GAUZE/BANDAGES/DRESSINGS) IMPLANT
BNDG GAUZE DERMACEA FLUFF 4 (GAUZE/BANDAGES/DRESSINGS) ×1 IMPLANT
DISSECTOR 3.8MM X 13CM (MISCELLANEOUS) ×1 IMPLANT
DRAPE SHEET LG 3/4 BI-LAMINATE (DRAPES) ×1 IMPLANT
DRAPE U-SHAPE 47X51 STRL (DRAPES) ×1 IMPLANT
DRSG EMULSION OIL 3X3 NADH (GAUZE/BANDAGES/DRESSINGS) ×1 IMPLANT
DURAPREP 26ML APPLICATOR (WOUND CARE) ×2 IMPLANT
ELECT MENISCUS 165MM 90D (ELECTRODE) IMPLANT
ELECT REM PT RETURN 15FT ADLT (MISCELLANEOUS) IMPLANT
FILTER STRAW (MISCELLANEOUS) IMPLANT
GAUZE PAD ABD 8X10 STRL (GAUZE/BANDAGES/DRESSINGS) IMPLANT
GAUZE SPONGE 4X4 12PLY STRL (GAUZE/BANDAGES/DRESSINGS) ×1 IMPLANT
GLOVE BIO SURGEON STRL SZ8 (GLOVE) ×2 IMPLANT
GLOVE BIOGEL PI IND STRL 7.0 (GLOVE) ×1 IMPLANT
GLOVE BIOGEL PI IND STRL 8 (GLOVE) ×2 IMPLANT
GLOVE SURG SYN 7.0 (GLOVE) ×1 IMPLANT
GLOVE SURG SYN 7.0 PF PI (GLOVE) ×1 IMPLANT
GOWN SRG XL LVL 4 BRTHBL STRL (GOWNS) ×1 IMPLANT
GOWN STRL REUS W/ TWL XL LVL3 (GOWN DISPOSABLE) ×2 IMPLANT
IV NS IRRIG 3000ML ARTHROMATIC (IV SOLUTION) IMPLANT
KIT BASIN OR (CUSTOM PROCEDURE TRAY) ×1 IMPLANT
KIT TURNOVER KIT A (KITS) ×1 IMPLANT
MANIFOLD NEPTUNE II (INSTRUMENTS) IMPLANT
NDL SAFETY ECLIPSE 18X1.5 (NEEDLE) IMPLANT
PACK ARTHROSCOPY WL (CUSTOM PROCEDURE TRAY) ×1 IMPLANT
PENCIL SMOKE EVACUATOR (MISCELLANEOUS) IMPLANT
SYR 3ML LL SCALE MARK (SYRINGE) IMPLANT
TOWEL OR 17X26 10 PK STRL BLUE (TOWEL DISPOSABLE) ×1 IMPLANT
TUBING ARTHROSCOPY IRRIG 16FT (MISCELLANEOUS) ×1 IMPLANT
WAND ABLATOR APOLLO I90 (BUR) IMPLANT
WATER STERILE IRR 1000ML POUR (IV SOLUTION) ×1 IMPLANT
WRAP KNEE MAXI GEL POST OP (GAUZE/BANDAGES/DRESSINGS) ×1 IMPLANT

## 2024-06-03 NOTE — Brief Op Note (Signed)
 [name] [id number] 06/03/2024   PRE-OP DIAGNOSIS: right knee TMM and CP  POST-OP DIAGNOSIS: same  PROCEDURE: PMM and CP  ANESTHESIA: general  Maude KANDICE Herald   Dictation #:  ?

## 2024-06-03 NOTE — Interval H&P Note (Signed)
 History and Physical Interval Note:  06/03/2024 2:07 PM  [name]  has presented today for surgery, with the diagnosis of RIGHT KNEE MEDIAL MENISCUS TEAR AND DEGENERATIVE JOINT DISEASE.  The various methods of treatment have been discussed with the patient and family. After consideration of risks, benefits and other options for treatment, the patient has consented to  Procedure(s) with comments: ARTHROSCOPY, KNEE, WITH MEDIAL MENISCECTOMY (Right) - RIGHT KNEE ARTHOSCOPY WITH MEDIAL MENISCECTOMY as a surgical intervention.  The patient's history has been reviewed, patient examined, no change in status, stable for surgery.  I have reviewed the patient's chart and labs.  Questions were answered to the patient's satisfaction.     Annalee Meyerhoff G Lessie Manigo

## 2024-06-03 NOTE — Op Note (Signed)
 NAMENAMEER, SUMMER MEDICAL RECORD NO: [id number] ACCOUNT NO: [id number] DATE OF BIRTH: [birth year] FACILITY: [facility] LOCATION: [facility] PHYSICIAN: Maude MATSU. Lorey Pallett, MD  Operative Report   DATE OF PROCEDURE: 06/03/2024  PREOPERATIVE DIAGNOSES: 1.  Right knee torn medial meniscus. 2.  Right knee chondromalacia patella.  POSTOPERATIVE DIAGNOSES: 1.  Right knee torn medial meniscus. 2.  Right knee chondromalacia patella.  PROCEDURE PERFORMED: 1.  Right knee partial medial meniscectomy. 2.  Right knee abrasion chondroplasty, patellofemoral.  SURGEON:  Maude MATSU. Barri Neidlinger, MD  ASSISTANT:  Prentice Earl, PA  ANESTHESIA:  General.  INDICATIONS:  The patient is a 73 year old man with a long history of right knee pain.  This has persisted despite various conservative measures.  By MRI scan, he has a torn medial meniscus and some degenerative change.  He has pain which is quite  disabling to him, and he is offered an arthroscopy.  Informed operative consent was obtained after discussion of possible complications, including reaction to anesthesia and infection.  SUMMARY FINDINGS AND PROCEDURE:  Under general anesthesia an arthroscopy of the right knee was performed. Suprapatellar pouch was benign, while the patellofemoral joint exhibited some grade III and IV change.  A thorough chondroplasty was done along with  abrasion to bleeding bone and some small areas on the undersurface of the patella.  Medial compartment was notable for a posterior horn medial meniscus tear addressed with about a 5% partial medial meniscectomy done with basket and shaver. ACL looked  normal and the lateral compartment was completely benign.  He did have some grade 3 change medial.  He was scheduled to go home the same day.  DESCRIPTION OF PROCEDURE:  The patient was taken to the operating suite where general anesthetic was applied without difficulty.  He was positioned supine and prepped and draped in normal sterile  fashion.  After the administration of preop IV Kefzol, and  an appropriate time-out, an arthroscopy of the right knee was performed through a total of two portals.  Findings were as noted above.  Procedure consisted of the abrasion arthroplasty of the patellofemoral followed by the partial medial meniscectomy  done with basket and shaver back to stable tissues.  A brief chondroplasty was also performed in the medial compartment, but there was no exposed bone that I could appreciate.  The knee was thoroughly irrigated followed by removal of arthroscopic  equipment.  Adaptic was placed over the portals followed by dry gauze and a loose Ace wrap.  Estimated blood loss and intraoperative fluids are obtained from the anesthesia records.  DISPOSITION:  The patient was extubated in the operating room and taken to the recovery room in stable condition.  Plans were for him to go home the same day and follow up in the office in less than a week.  I will contact him by phone tonight.   PUS D: 06/03/2024 3:38:49 pm T: 06/03/2024 4:36:00 pm  JOB: [id number]/ [id number]

## 2024-06-03 NOTE — Transfer of Care (Signed)
 Immediate Anesthesia Transfer of Care Note  Patient: [name]  Procedure(s) Performed: ARTHROSCOPY, KNEE, WITH MEDIAL MENISCECTOMY (Right: Knee)  Patient Location: PACU  Anesthesia Type:General  Level of Consciousness: awake, alert , and oriented  Airway & Oxygen Therapy: Patient Spontanous Breathing and Patient connected to face mask oxygen  Post-op Assessment: Report given to RN and Post -op Vital signs reviewed and stable  Post vital signs: Reviewed and stable  Last Vitals:  Vitals Value Taken Time  BP 129/62 06/03/24 16:15  Temp    Pulse 54 06/03/24 16:16  Resp 10 06/03/24 16:16  SpO2 100 % 06/03/24 16:16  Vitals shown include unfiled device data.  Last Pain:  Vitals:   06/03/24 1155  TempSrc:   PainSc: 0-No pain         Complications: No notable events documented.

## 2024-06-03 NOTE — Anesthesia Procedure Notes (Signed)
 Procedure Name: LMA Insertion Date/Time: 06/03/2024 3:00 PM  Performed by: Landy Chip HERO, CRNAPre-anesthesia Checklist: Patient identified, Emergency Drugs available, Suction available and Patient being monitored Patient Re-evaluated:Patient Re-evaluated prior to induction Oxygen Delivery Method: Circle System Utilized Preoxygenation: Pre-oxygenation with 100% oxygen Induction Type: IV induction Ventilation: Mask ventilation without difficulty LMA: LMA inserted LMA Size: 5.0 Number of attempts: 1 Airway Equipment and Method: Bite block Placement Confirmation: positive ETCO2 Tube secured with: Tape Dental Injury: Teeth and Oropharynx as per pre-operative assessment

## 2024-06-03 NOTE — Progress Notes (Signed)
 Orthopedic Tech Progress Note Patient Details:  [name] 26-Apr-1951 [id number]  Ortho Devices Type of Ortho Device: Crutches Ortho Device/Splint Interventions: Ordered, Application, Adjustment   Post Interventions Patient Tolerated: Well Instructions Provided: Adjustment of device, Care of device  [name] 06/03/2024, 4:52 PM

## 2024-06-04 ENCOUNTER — Encounter (HOSPITAL_COMMUNITY): Payer: Self-pay | Admitting: Orthopaedic Surgery

## 2024-06-04 NOTE — Anesthesia Postprocedure Evaluation (Signed)
 Anesthesia Post Note  Patient: [name]  Procedure(s) Performed: ARTHROSCOPY, KNEE, WITH MEDIAL MENISCECTOMY (Right: Knee)     Patient location during evaluation: PACU Anesthesia Type: General Level of consciousness: awake and alert Pain management: pain level controlled Vital Signs Assessment: post-procedure vital signs reviewed and stable Respiratory status: spontaneous breathing, nonlabored ventilation, respiratory function stable and patient connected to nasal cannula oxygen Cardiovascular status: blood pressure returned to baseline and stable Postop Assessment: no apparent nausea or vomiting Anesthetic complications: no   No notable events documented.  Last Vitals:  Vitals:   06/03/24 1630 06/03/24 1645  BP: (!) 140/67 (!) 141/67  Pulse: (!) 58   Resp: 14 20  Temp: 36.4 C   SpO2: 98% 99%    Last Pain:  Vitals:   06/03/24 1645  TempSrc:   PainSc: 0-No pain                 Lacora Folmer L Child Campoy

## 2024-06-05 ENCOUNTER — Ambulatory Visit (HOSPITAL_COMMUNITY)
Admission: RE | Admit: 2024-06-05 | Discharge: 2024-06-05 | Disposition: A | Discharge status: Home / Self Care | Source: Ambulatory Visit | Attending: Vascular Surgery | Admitting: Vascular Surgery

## 2024-06-05 ENCOUNTER — Other Ambulatory Visit (HOSPITAL_COMMUNITY): Payer: Self-pay | Admitting: Orthopaedic Surgery

## 2024-06-05 DIAGNOSIS — R600 Localized edema: Secondary | ICD-10-CM | POA: Diagnosis not present

## 2024-06-05 DIAGNOSIS — M25561 Pain in right knee: Secondary | ICD-10-CM

## 2024-06-05 DIAGNOSIS — K5904 Chronic idiopathic constipation: Secondary | ICD-10-CM | POA: Diagnosis not present

## 2024-06-10 DIAGNOSIS — R0982 Postnasal drip: Secondary | ICD-10-CM | POA: Diagnosis not present

## 2024-06-12 ENCOUNTER — Other Ambulatory Visit (HOSPITAL_COMMUNITY): Payer: Self-pay | Admitting: Internal Medicine

## 2024-06-12 ENCOUNTER — Ambulatory Visit (HOSPITAL_COMMUNITY)
Admission: RE | Admit: 2024-06-12 | Discharge: 2024-06-12 | Disposition: A | Discharge status: Home / Self Care | Source: Ambulatory Visit | Attending: Vascular Surgery | Admitting: Vascular Surgery

## 2024-06-12 DIAGNOSIS — M79604 Pain in right leg: Secondary | ICD-10-CM | POA: Diagnosis not present

## 2024-06-12 DIAGNOSIS — Z86718 Personal history of other venous thrombosis and embolism: Secondary | ICD-10-CM | POA: Diagnosis not present

## 2024-06-12 DIAGNOSIS — M7989 Other specified soft tissue disorders: Secondary | ICD-10-CM | POA: Insufficient documentation

## 2024-06-13 DIAGNOSIS — M25661 Stiffness of right knee, not elsewhere classified: Secondary | ICD-10-CM | POA: Diagnosis not present

## 2024-06-13 DIAGNOSIS — R262 Difficulty in walking, not elsewhere classified: Secondary | ICD-10-CM | POA: Diagnosis not present

## 2024-06-16 DIAGNOSIS — R262 Difficulty in walking, not elsewhere classified: Secondary | ICD-10-CM | POA: Diagnosis not present

## 2024-06-16 DIAGNOSIS — M25661 Stiffness of right knee, not elsewhere classified: Secondary | ICD-10-CM | POA: Diagnosis not present

## 2024-06-20 DIAGNOSIS — M79661 Pain in right lower leg: Secondary | ICD-10-CM | POA: Diagnosis not present

## 2024-06-30 DIAGNOSIS — C61 Malignant neoplasm of prostate: Secondary | ICD-10-CM | POA: Diagnosis not present

## 2024-06-30 DIAGNOSIS — N281 Cyst of kidney, acquired: Secondary | ICD-10-CM | POA: Diagnosis not present

## 2024-06-30 DIAGNOSIS — K862 Cyst of pancreas: Secondary | ICD-10-CM | POA: Diagnosis not present

## 2024-07-07 ENCOUNTER — Ambulatory Visit (HOSPITAL_COMMUNITY)
Admission: RE | Admit: 2024-07-07 | Discharge: 2024-07-07 | Disposition: A | Discharge status: Home / Self Care | Source: Ambulatory Visit | Attending: Cardiology | Admitting: Cardiology

## 2024-07-07 DIAGNOSIS — E291 Testicular hypofunction: Secondary | ICD-10-CM | POA: Diagnosis not present

## 2024-07-07 DIAGNOSIS — I10 Essential (primary) hypertension: Secondary | ICD-10-CM | POA: Insufficient documentation

## 2024-07-07 DIAGNOSIS — I480 Paroxysmal atrial fibrillation: Secondary | ICD-10-CM | POA: Insufficient documentation

## 2024-07-07 DIAGNOSIS — I4891 Unspecified atrial fibrillation: Secondary | ICD-10-CM | POA: Insufficient documentation

## 2024-07-07 MED ORDER — IOHEXOL 350 MG/ML SOLN
80.0000 mL | Freq: Once | INTRAVENOUS | Status: AC | PRN
  Administered 2024-07-07: 80 mL via INTRAVENOUS

## 2024-07-08 LAB — CBC
Hematocrit: 43.2 % (ref 37.5–51.0)
Hemoglobin: 14.2 g/dL (ref 13.0–17.7)
MCH: 30.1 pg (ref 26.6–33.0)
MCHC: 32.9 g/dL (ref 31.5–35.7)
MCV: 92 fL (ref 79–97)
Platelets: 220 x10E3/uL (ref 150–450)
RBC: 4.72 x10E6/uL (ref 4.14–5.80)
RDW: 13.3 % (ref 11.6–15.4)
WBC: 5.9 x10E3/uL (ref 3.4–10.8)

## 2024-07-08 LAB — BASIC METABOLIC PANEL WITH GFR
BUN/Creatinine Ratio: 11 (ref 10–24)
BUN: 11 mg/dL (ref 8–27)
CO2: 22 mmol/L (ref 20–29)
Calcium: 9.3 mg/dL (ref 8.6–10.2)
Chloride: 101 mmol/L (ref 96–106)
Creatinine, Ser: 1 mg/dL (ref 0.76–1.27)
Glucose: 90 mg/dL (ref 70–99)
Potassium: 4.6 mmol/L (ref 3.5–5.2)
Sodium: 139 mmol/L (ref 134–144)
eGFR: 79 mL/min/1.73 (ref >59)

## 2024-07-09 DIAGNOSIS — R262 Difficulty in walking, not elsewhere classified: Secondary | ICD-10-CM | POA: Diagnosis not present

## 2024-07-09 DIAGNOSIS — M25661 Stiffness of right knee, not elsewhere classified: Secondary | ICD-10-CM | POA: Diagnosis not present

## 2024-07-11 ENCOUNTER — Ambulatory Visit: Admitting: Cardiology

## 2024-07-15 ENCOUNTER — Ambulatory Visit: Payer: Self-pay | Admitting: Cardiology

## 2024-07-17 DIAGNOSIS — K5904 Chronic idiopathic constipation: Secondary | ICD-10-CM | POA: Diagnosis not present

## 2024-07-21 ENCOUNTER — Telehealth (HOSPITAL_COMMUNITY): Payer: Self-pay

## 2024-07-21 NOTE — Telephone Encounter (Signed)
 Attempted to reach patient to discuss upcoming procedure, no answer. Left VM for patient to return call.

## 2024-07-25 NOTE — Pre-Procedure Instructions (Signed)
 Attempted to call patient regarding procedure instructions.  Left voicemail on the following items: Arrival time 0515 Nothing to eat or drink after midnight No meds AM of procedure Responsible person to drive you home and stay with you for 24 hrs  Have you missed any doses of anti-coagulant Eliquis- should be taken twice a day, if you have missed any doses please let us know.  Don't take dose morning of procedure.

## 2024-07-27 NOTE — Anesthesia Preprocedure Evaluation (Signed)
 Anesthesia Evaluation  Patient identified by MRN, date of birth, ID band Patient awake    Reviewed: Allergy  & Precautions, NPO status , Patient's Chart, lab work & pertinent test results  History of Anesthesia Complications Negative for: history of anesthetic complications  Airway Mallampati: II  TM Distance: >3 FB Neck ROM: Full    Dental no notable dental hx. (+) Chipped, Teeth Intact, Dental Advisory Given,    Pulmonary sleep apnea and Continuous Positive Airway Pressure Ventilation , neg COPD, former smoker   Pulmonary exam normal breath sounds clear to auscultation       Cardiovascular hypertension, Pt. on medications (-) angina (-) Past MI Normal cardiovascular exam+ dysrhythmias (on eliquis ) Atrial Fibrillation + Valvular Problems/Murmurs (mild AL) AI  Rhythm:Irregular Rate:Abnormal  09/2023 TTE  1. Left ventricular ejection fraction, by estimation, is 60 to 65%. The  left ventricle has normal function. The left ventricle has no regional  wall motion abnormalities. There is moderate left ventricular hypertrophy.  Left ventricular diastolic  parameters are consistent with Grade II diastolic dysfunction  (pseudonormalization).   2. Right ventricular systolic function is normal. The right ventricular  size is normal. There is normal pulmonary artery systolic pressure.   3. Left atrial size was mildly dilated.   4. The mitral valve is normal in structure. Trivial mitral valve  regurgitation. No evidence of mitral stenosis.   5. The aortic valve is tricuspid. There is mild calcification of the  aortic valve. There is mild thickening of the aortic valve. Aortic valve  regurgitation is mild. No aortic stenosis is present.   6. Aortic dilatation noted. There is mild dilatation of the ascending  aorta, measuring 42 mm.   7. The inferior vena cava is normal in size with greater than 50%  respiratory variability, suggesting right  atrial pressure of 3 mmHg.      Neuro/Psych  negative psych ROS   GI/Hepatic ,GERD  Medicated and Controlled,,  Endo/Other    Renal/GU Lab Results      Component                Value               Date                    K                        4.6                 07/07/2024                    CREATININE               1.00                07/07/2024                EGFR                     79                  07/07/2024                   Musculoskeletal  (+) Arthritis ,    Abdominal  (+) + obese  Peds  Hematology Lab Results      Component  Value               Date                      WBC                      5.9                 07/07/2024                HGB                      14.2                07/07/2024                HCT                      43.2                07/07/2024                MCV                      92                  07/07/2024                PLT                      220                 07/07/2024              Anesthesia Other Findings All: crestor , lipitor zionsamide  Reproductive/Obstetrics                              Anesthesia Physical Anesthesia Plan  ASA: 3  Anesthesia Plan: General   Post-op Pain Management: Minimal or no pain anticipated   Induction: Intravenous  PONV Risk Score and Plan: 3 and Treatment may vary due to age or medical condition, Ondansetron  and Dexamethasone   Airway Management Planned: Oral ETT  Additional Equipment: None  Intra-op Plan:   Post-operative Plan: Extubation in OR  Informed Consent: I have reviewed the patients History and Physical, chart, labs and discussed the procedure including the risks, benefits and alternatives for the proposed anesthesia with the patient or authorized representative who has indicated his/her understanding and acceptance.     Dental advisory given  Plan Discussed with: CRNA and Surgeon  Anesthesia Plan Comments:           Anesthesia Quick Evaluation

## 2024-07-28 ENCOUNTER — Other Ambulatory Visit: Payer: Self-pay

## 2024-07-28 ENCOUNTER — Ambulatory Visit (HOSPITAL_COMMUNITY)
Admission: RE | Disposition: A | Discharge status: Home / Self Care | Payer: Self-pay | Source: Home / Self Care | Attending: Cardiology

## 2024-07-28 ENCOUNTER — Other Ambulatory Visit (HOSPITAL_COMMUNITY): Payer: Self-pay

## 2024-07-28 ENCOUNTER — Ambulatory Visit (HOSPITAL_COMMUNITY): Payer: Self-pay | Admitting: Anesthesiology

## 2024-07-28 ENCOUNTER — Ambulatory Visit (HOSPITAL_COMMUNITY)
Admission: RE | Admit: 2024-07-28 | Discharge: 2024-07-28 | Disposition: A | Discharge status: Home / Self Care | Attending: Cardiology | Admitting: Cardiology

## 2024-07-28 ENCOUNTER — Encounter (HOSPITAL_COMMUNITY): Payer: Self-pay | Admitting: Cardiology

## 2024-07-28 DIAGNOSIS — Z79899 Other long term (current) drug therapy: Secondary | ICD-10-CM | POA: Diagnosis not present

## 2024-07-28 DIAGNOSIS — I10 Essential (primary) hypertension: Secondary | ICD-10-CM | POA: Insufficient documentation

## 2024-07-28 DIAGNOSIS — Z87891 Personal history of nicotine dependence: Secondary | ICD-10-CM

## 2024-07-28 DIAGNOSIS — I4891 Unspecified atrial fibrillation: Secondary | ICD-10-CM

## 2024-07-28 DIAGNOSIS — I351 Nonrheumatic aortic (valve) insufficiency: Secondary | ICD-10-CM | POA: Diagnosis not present

## 2024-07-28 DIAGNOSIS — Z7901 Long term (current) use of anticoagulants: Secondary | ICD-10-CM | POA: Insufficient documentation

## 2024-07-28 DIAGNOSIS — I4819 Other persistent atrial fibrillation: Secondary | ICD-10-CM

## 2024-07-28 DIAGNOSIS — K219 Gastro-esophageal reflux disease without esophagitis: Secondary | ICD-10-CM | POA: Diagnosis not present

## 2024-07-28 HISTORY — PX: ATRIAL FIBRILLATION ABLATION: EP1191

## 2024-07-28 LAB — POCT ACTIVATED CLOTTING TIME: Activated Clotting Time: 245 s

## 2024-07-28 SURGERY — ATRIAL FIBRILLATION ABLATION
Anesthesia: General

## 2024-07-28 MED ORDER — DEXAMETHASONE SODIUM PHOSPHATE 10 MG/ML IJ SOLN
INTRAMUSCULAR | Status: DC | PRN
  Administered 2024-07-28: 10 mg via INTRAVENOUS

## 2024-07-28 MED ORDER — ONDANSETRON HCL 4 MG/2ML IJ SOLN
INTRAMUSCULAR | Status: DC | PRN
  Administered 2024-07-28: 4 mg via INTRAVENOUS

## 2024-07-28 MED ORDER — COLCHICINE 0.6 MG PO TABS
0.6000 mg | ORAL_TABLET | Freq: Two times a day (BID) | ORAL | Status: DC
  Administered 2024-07-28: 0.6 mg via ORAL
  Filled 2024-07-28: qty 1

## 2024-07-28 MED ORDER — HEPARIN SODIUM (PORCINE) 1000 UNIT/ML IJ SOLN
INTRAMUSCULAR | Status: DC | PRN
  Administered 2024-07-28: 5000 [IU] via INTRAVENOUS
  Administered 2024-07-28: 12000 [IU] via INTRAVENOUS

## 2024-07-28 MED ORDER — PANTOPRAZOLE SODIUM 40 MG PO TBEC
40.0000 mg | DELAYED_RELEASE_TABLET | Freq: Every day | ORAL | 0 refills | Status: AC
  Filled 2024-07-28: qty 45, 45d supply, fill #0

## 2024-07-28 MED ORDER — FENTANYL CITRATE (PF) 100 MCG/2ML IJ SOLN
INTRAMUSCULAR | Status: AC
  Filled 2024-07-28: qty 2

## 2024-07-28 MED ORDER — SODIUM CHLORIDE 0.9% FLUSH
3.0000 mL | INTRAVENOUS | Status: DC | PRN
Start: 2024-07-28 — End: 2024-07-28

## 2024-07-28 MED ORDER — ACETAMINOPHEN 325 MG PO TABS: 650.0000 mg | ORAL_TABLET | ORAL | Status: DC | PRN

## 2024-07-28 MED ORDER — FENTANYL CITRATE (PF) 250 MCG/5ML IJ SOLN
INTRAMUSCULAR | Status: DC | PRN
  Administered 2024-07-28 (×2): 25 ug via INTRAVENOUS
  Administered 2024-07-28: 50 ug via INTRAVENOUS

## 2024-07-28 MED ORDER — SODIUM CHLORIDE 0.9% FLUSH: 3.0000 mL | Freq: Two times a day (BID) | INTRAVENOUS | Status: DC

## 2024-07-28 MED ORDER — ONDANSETRON HCL 4 MG/2ML IJ SOLN: 4.0000 mg | Freq: Four times a day (QID) | INTRAMUSCULAR | Status: DC | PRN

## 2024-07-28 MED ORDER — PHENYLEPHRINE HCL-NACL 20-0.9 MG/250ML-% IV SOLN
INTRAVENOUS | Status: DC | PRN
  Administered 2024-07-28: 20 ug/min via INTRAVENOUS

## 2024-07-28 MED ORDER — SODIUM CHLORIDE 0.9 % IV SOLN: INTRAVENOUS | Status: DC

## 2024-07-28 MED ORDER — SODIUM CHLORIDE 0.9 % IV SOLN: 250.0000 mL | INTRAVENOUS | Status: DC | PRN

## 2024-07-28 MED ORDER — HEPARIN (PORCINE) IN NACL 1000-0.9 UT/500ML-% IV SOLN
INTRAVENOUS | Status: DC | PRN
  Administered 2024-07-28 (×3): 500 mL

## 2024-07-28 MED ORDER — PROTAMINE SULFATE 10 MG/ML IV SOLN
INTRAVENOUS | Status: DC | PRN
  Administered 2024-07-28: 35 mg via INTRAVENOUS

## 2024-07-28 MED ORDER — HEPARIN SODIUM (PORCINE) 1000 UNIT/ML IJ SOLN
INTRAMUSCULAR | Status: AC
Start: 2024-07-28 — End: 2024-07-28
  Filled 2024-07-28: qty 20

## 2024-07-28 MED ORDER — SUGAMMADEX SODIUM 200 MG/2ML IV SOLN
INTRAVENOUS | Status: DC | PRN
  Administered 2024-07-28: 150 mg via INTRAVENOUS

## 2024-07-28 MED ORDER — PROPOFOL 10 MG/ML IV BOLUS
INTRAVENOUS | Status: DC | PRN
  Administered 2024-07-28: 130 mg via INTRAVENOUS

## 2024-07-28 MED ORDER — ATROPINE SULFATE 1 MG/10ML IJ SOSY
PREFILLED_SYRINGE | INTRAMUSCULAR | Status: DC | PRN
  Administered 2024-07-28: 1 mg via INTRAVENOUS

## 2024-07-28 MED ORDER — ROCURONIUM BROMIDE 10 MG/ML (PF) SYRINGE
PREFILLED_SYRINGE | INTRAVENOUS | Status: DC | PRN
  Administered 2024-07-28: 10 mg via INTRAVENOUS
  Administered 2024-07-28: 50 mg via INTRAVENOUS
  Administered 2024-07-28: 10 mg via INTRAVENOUS

## 2024-07-28 MED ORDER — PANTOPRAZOLE SODIUM 40 MG PO TBEC
40.0000 mg | DELAYED_RELEASE_TABLET | Freq: Every day | ORAL | Status: DC
  Administered 2024-07-28: 40 mg via ORAL
  Filled 2024-07-28: qty 1

## 2024-07-28 MED ORDER — LIDOCAINE 2% (20 MG/ML) 5 ML SYRINGE
INTRAMUSCULAR | Status: DC | PRN
  Administered 2024-07-28: 80 mg via INTRAVENOUS

## 2024-07-28 MED ORDER — COLCHICINE 0.6 MG PO TABS
0.6000 mg | ORAL_TABLET | Freq: Two times a day (BID) | ORAL | 0 refills | Status: DC
  Filled 2024-07-28: qty 10, 5d supply, fill #0

## 2024-07-28 MED ORDER — APIXABAN 5 MG PO TABS
5.0000 mg | ORAL_TABLET | Freq: Two times a day (BID) | ORAL | Status: DC
  Administered 2024-07-28: 5 mg via ORAL
  Filled 2024-07-28: qty 1

## 2024-07-28 SURGICAL SUPPLY — 20 items
BAG SNAP BAND KOVER 36X36 (MISCELLANEOUS) IMPLANT
BLANKET WARM UNDERBOD FULL ACC (MISCELLANEOUS) ×1 IMPLANT
CABLE FARASTAR GEN2 SNGL USE (CABLE) IMPLANT
CATH FARAWAVE 2.0 31 (CATHETERS) IMPLANT
CATH GE 8FR SOUNDSTAR (CATHETERS) IMPLANT
CATH OCTARAY 2.0 F 3-3-3-3-3 (CATHETERS) IMPLANT
CATH WEB BIDIR CS D-F NONAUTO (CATHETERS) IMPLANT
CATH WEBSTER BI DIR CS D-F CRV (CATHETERS) IMPLANT
CLOSURE PERCLOSE PROSTYLE (VASCULAR PRODUCTS) IMPLANT
COVER SWIFTLINK CONNECTOR (BAG) ×1 IMPLANT
DILATOR VESSEL 38 20CM 16FR (INTRODUCER) IMPLANT
GUIDEWIRE INQWIRE 1.5J.035X260 (WIRE) IMPLANT
KIT VERSACROSS CNCT FARADRIVE (KITS) IMPLANT
PACK EP LF (CUSTOM PROCEDURE TRAY) ×1 IMPLANT
PAD DEFIB RADIO PHYSIO CONN (PAD) ×1 IMPLANT
PATCH CARTO3 (PAD) IMPLANT
SHEATH FARADRIVE STEERABLE (SHEATH) IMPLANT
SHEATH PINNACLE 8F 10CM (SHEATH) IMPLANT
SHEATH PINNACLE 9F 10CM (SHEATH) IMPLANT
SHEATH PROBE COVER 6X72 (BAG) IMPLANT

## 2024-07-28 NOTE — Anesthesia Procedure Notes (Signed)
 Procedure Name: Intubation Date/Time: 07/28/2024 7:46 AM  Performed by: Atanacio Arland HERO, CRNAPre-anesthesia Checklist: Patient identified, Emergency Drugs available, Suction available and Patient being monitored Patient Re-evaluated:Patient Re-evaluated prior to induction Oxygen Delivery Method: Circle System Utilized Preoxygenation: Pre-oxygenation with 100% oxygen Induction Type: IV induction Ventilation: Mask ventilation without difficulty Laryngoscope Size: Mac, 4 and Glidescope Grade View: Grade III Tube type: Oral Tube size: 7.5 mm Number of attempts: 3 Airway Equipment and Method: Stylet Placement Confirmation: ETT inserted through vocal cords under direct vision, positive ETCO2 and breath sounds checked- equal and bilateral Secured at: 24 cm Tube secured with: Tape Dental Injury: Teeth and Oropharynx as per pre-operative assessment  Comments: DLx1 with MAC 4 by CRNA, unable to view cords, DL by Dr. Jefm with MAC 4, unable to view cords, DL with glidescope 4 by Dr. Jefm, cords in view. Intubated without difficulty.

## 2024-07-28 NOTE — H&P (Signed)
 Electrophysiology Office Follow up Visit Note:     Date:  07/28/2024    ID:  [name], DOB 1951-02-09, MRN [id number]   PCP:  Charlott Dorn LABOR, MD    [facility] Cardiologist:  Lonni LITTIE Nanas, MD  [facility] Electrophysiologist:  OLE ONEIDA HOLTS, MD      Interval History:       [name] is a 73 y.o. male who presents for a follow up visit.    The patient last saw Norleen in the A-fib clinic Apr 24, 2024.  I last saw the patient in October 2022.  He had a prior catheter ablation in December 2021.  His Multaq  was stopped after the ablation because of lack of recurrence.   When he saw Norleen he reported a 12 to 15% A-fib burden based on his Apple Watch.  He can feel his episodes of atrial fibrillation at night.  He expressed an interest to pursue redo catheter ablation.  Presents for AF ablation. Procedure reviewed.  Objective Past medical, surgical, social and family history were reviewed.   ROS:   Please see the history of present illness.    All other systems reviewed and are negative.   EKGs/Labs/Other Studies Reviewed:     The following studies were reviewed today:               Physical Exam:     VS:  BP 123/65   Pulse 55   Ht 6' (1.829 m)   Wt 229 lb 9.6 oz (104.1 kg)   SpO2 98%   BMI 31.14 kg/m         Wt Readings from Last 3 Encounters:  05/07/24 229 lb 9.6 oz (104.1 kg)  04/30/24 229 lb 3.2 oz (104 kg)  04/24/24 228 lb (103.4 kg)      GEN: no distress CARD: RRR, No MRG RESP: No IWOB. CTAB.     Assessment ASSESSMENT:     1. Paroxysmal atrial fibrillation (HCC)   2. Essential hypertension     PLAN:     In order of problems listed above:   #Atrial fibrillation Recurrent after 2021 catheter ablation.  Previously on Multaq . Continue Eliquis  for stroke prophylaxis I discussed treatment options for the patient including antiarrhythmic drugs and catheter ablation and he is interested in pursuing redo catheter  ablation.  I discussed the procedure including the risks, recovery and likelihood of success.   Discussed treatment options today for AF including antiarrhythmic drug therapy and ablation. Discussed risks, recovery and likelihood of success with each treatment strategy. Risk, benefits, and alternatives to EP study and ablation for afib were discussed. These risks include but are not limited to stroke, bleeding, vascular damage, tamponade, perforation, damage to the esophagus, lungs, phrenic nerve and other structures, pulmonary vein stenosis, worsening renal function, coronary vasospasm and death.  Discussed potential need for repeat ablation procedures and antiarrhythmic drugs after an initial ablation. The patient understands these risk and wishes to proceed.  We will therefore proceed with catheter ablation at the next available time.  Carto, ICE, anesthesia are requested for the procedure.  Will also obtain CT PV protocol prior to the procedure to exclude LAA thrombus and further evaluate atrial anatomy.   #Hypertension At goal today.  Recommend checking blood pressures 1-2 times per week at home and recording the values.  Recommend bringing these recordings to the primary care physician.   Presents for AF ablation. Procedure reviewed.   Signed, OLE HOLTS, MD, [facility], [facility] 07/28/2024  Electrophysiology [facility]

## 2024-07-28 NOTE — Anesthesia Postprocedure Evaluation (Signed)
 Anesthesia Post Note  Patient: [name]  Procedure(s) Performed: ATRIAL FIBRILLATION ABLATION     Patient location during evaluation: PACU Anesthesia Type: General Level of consciousness: awake and alert Pain management: pain level controlled Vital Signs Assessment: post-procedure vital signs reviewed and stable Respiratory status: spontaneous breathing, nonlabored ventilation, respiratory function stable and patient connected to nasal cannula oxygen Cardiovascular status: blood pressure returned to baseline and stable Postop Assessment: no apparent nausea or vomiting Anesthetic complications: no   No notable events documented.  Last Vitals:  Vitals:   07/28/24 0940 07/28/24 1000  BP:  (!) 113/54  Pulse: 60 (!) 59  Resp: 16 15  Temp:    SpO2: 94% 94%    Last Pain:  Vitals:   07/28/24 0920  TempSrc: Oral  PainSc:                  Garnette DELENA Gab

## 2024-07-28 NOTE — Discharge Instructions (Signed)

## 2024-07-28 NOTE — Transfer of Care (Signed)
 Immediate Anesthesia Transfer of Care Note  Patient: [name]  Procedure(s) Performed: ATRIAL FIBRILLATION ABLATION  Patient Location: PACU  Anesthesia Type:General  Level of Consciousness: awake and alert   Airway & Oxygen Therapy: Patient Spontanous Breathing and Patient connected to nasal cannula oxygen  Post-op Assessment: Report given to RN and Post -op Vital signs reviewed and stable  Post vital signs: Reviewed and stable  Last Vitals:  Vitals Value Taken Time  BP 122/67 07/28/24 09:20  Temp 36.8 C 07/28/24 09:15  Pulse 63 07/28/24 09:20  Resp 12 07/28/24 09:20  SpO2 98 % 07/28/24 09:20  Vitals shown include unfiled device data.  Last Pain:  Vitals:   07/28/24 0915  TempSrc: Oral  PainSc:          Complications: No notable events documented.

## 2024-07-29 ENCOUNTER — Other Ambulatory Visit (HOSPITAL_COMMUNITY): Payer: Self-pay

## 2024-07-29 ENCOUNTER — Telehealth (HOSPITAL_COMMUNITY): Payer: Self-pay

## 2024-07-29 MED FILL — Fentanyl Citrate Preservative Free (PF) Inj 100 MCG/2ML: INTRAMUSCULAR | Qty: 2 | Status: AC

## 2024-07-29 NOTE — Telephone Encounter (Signed)
 Spoke with patient to complete post procedure follow up call.  Patient reports no complications with groin sites.   Instructions reviewed with patient:  Remove large bandage at puncture site after 24 hours. It is normal to have bruising, tenderness, mild swelling, and a pea or marble sized lump/knot at the groin site which can take up to three months to resolve.  Get help right away if you notice sudden swelling at the puncture site.  Check your puncture site every day for signs of infection: fever, redness, swelling, pus drainage, warmth, foul odor or excessive pain. If this occurs, please call the office at [phone], to speak with the nurse. Get help right away if your puncture site is bleeding and the bleeding does not stop after applying firm pressure to the area.  You may continue to have skipped beats/ atrial fibrillation during the first several months after your procedure.  It is very important not to miss any doses of your blood thinner Eliquis .   You will follow up with the Afib clinic on 08/25/24 and follow up with APP on 10/28/24. Patient reports he will be leaving for [place]  for the winter on Nov 13 thru May and request appointment to be made earlier, if possible. Will send message to EP scheduling to assist with changing appt.   Patient verbalized understanding to all instructions provided.

## 2024-08-25 ENCOUNTER — Ambulatory Visit (HOSPITAL_COMMUNITY): Admitting: Internal Medicine

## 2024-08-27 ENCOUNTER — Ambulatory Visit (HOSPITAL_COMMUNITY)
Admission: RE | Admit: 2024-08-27 | Discharge: 2024-08-27 | Disposition: A | Discharge status: Home / Self Care | Source: Ambulatory Visit | Attending: Internal Medicine | Admitting: Internal Medicine

## 2024-08-27 VITALS — BP 122/62 | HR 61 | Ht 68.5 in | Wt 229.8 lb

## 2024-08-27 DIAGNOSIS — I4891 Unspecified atrial fibrillation: Secondary | ICD-10-CM | POA: Diagnosis not present

## 2024-08-27 DIAGNOSIS — I480 Paroxysmal atrial fibrillation: Secondary | ICD-10-CM | POA: Diagnosis not present

## 2024-08-27 DIAGNOSIS — D6869 Other thrombophilia: Secondary | ICD-10-CM | POA: Insufficient documentation

## 2024-08-27 NOTE — Progress Notes (Signed)
 Primary Care Physician: Charlott Manuel LABOR, MD Primary Cardiologist: Dr Kate Primary Electrophysiologist: Dr Cindie Referring Physician: Dr Kate [name] is a 73 y.o. male with a history of CAD, HTN, HLD, OSA, DVT, atrial flutter, and paroxysmal atrial fibrillation who presents for follow up in the [facility].  The patient was initially diagnosed with atrial fibrillation 09/2016 and was admitted. He was started on flecainide  and converted to SR after the first dose. He was maintained on flecainide  for years but he had a stress test in [place]  which was abnormal and LHC showed 60-70% stenosis of D1. His flecainide  was stopped 08/25/20 due to presence of CAD. Patient is on Eliquis  for a CHADS2VASC score of 3. Since stopping the flecainide , he has had two episodes of afib with symptoms of heart racing and feeling off.  There were no specific triggers that he could identify.   On follow up today, patient is s/p afib ablation with Dr Cindie on 11/25/20. He does report that he has had mini afib episodes since the ablation but nothing sustained or very symptomatic. He did present to the ED with concern for groin cath site pain and workup was negative for pseudoaneurysm. The pain has completely resolved. He denies CP or swallowing pain.   On follow up 10/08/23, he is currently in NSR. He contacted Cardiology office on 10/30 and 10/31 noting episodes of symptomatic Afib. He has not missed any doses of Eliquis . He feels as though he is going in and out of rhythm by checking Kardiamobile.   On follow up 04/24/24, he is here in NSR. While in [place] , he has noticed increase in Afib after discontinuing Multaq  due to GI issues. His Apple watch notes 12-15% Afib burden with HR in the 140-150s at times. He typically does not have cardiac awareness when he has Afib with RVR but can feel it at night. He is very interested in repeat ablation. No bleeding issues  on Eliquis .   On follow up 08/27/24, patient is currently in NSR. S/p Afib ablation on 07/28/24 by Dr. Cindie. He notes via Apple watch to have Afib burden 4-8% since ablation. No chest pain or SOB. Leg sites healed without issue. No missed doses of anticoagulant.  Today, he denies symptoms of orthopnea, PND, lower extremity edema, dizziness, presyncope, syncope, snoring, daytime somnolence, bleeding, or neurologic sequela. The patient is tolerating medications without difficulties and is otherwise without complaint today.    Atrial Fibrillation Risk Factors:  he does have symptoms or diagnosis of sleep apnea. he is compliant with CPAP therapy. he does not have a history of rheumatic fever.   he has a BMI of Body mass index is 34.43 kg/m.SABRA Filed Weights   08/27/24 1014  Weight: 104.2 kg       Family History  Problem Relation Age of Onset   Allergic rhinitis Sister    Hypertension Sister    Allergic rhinitis Brother    Prostate cancer Brother    Hypertension Brother      Atrial Fibrillation Management history:  Previous antiarrhythmic drugs: flecainide , Multaq  Previous cardioversions: none Previous ablations: 11/25/20, 07/28/24 CHADS2VASC score: 3 Anticoagulation history: Eliquis    Past Medical History:  Diagnosis Date   Arthritis    arthritis -back   Atrial fibrillation (HCC)    a. diagnosed 09/2016 - started on Cardizem  CD and Flecainide . Continue Coumadin  for anticoagulation.   Cancer [facility])    cancer Prostate- surgery only   Chronic anticoagulation  09/10/2016   History of DVT (deep vein thrombosis)    History of DVT of lower extremity    left leg has some residual crculation issues   History of prostate cancer    Hyperlipidemia 09/09/2018   Hypertension    Hypertensive heart disease without CHF 09/29/2016   Obesity (BMI 30-39.9)    Paroxysmal atrial fibrillation (HCC)    CHA2DS2VASC score 2   Sleep apnea    a. uses CPAP   Sleep apnea in adult    Past  Surgical History:  Procedure Laterality Date   ATRIAL FIBRILLATION ABLATION N/A 11/25/2020   Procedure: ATRIAL FIBRILLATION ABLATION;  Surgeon: Cindie Ole DASEN, MD;  Location: [facility] INVASIVE CV LAB;  Service: Cardiovascular;  Laterality: N/A;   ATRIAL FIBRILLATION ABLATION N/A 07/28/2024   Procedure: ATRIAL FIBRILLATION ABLATION;  Surgeon: Cindie Ole DASEN, MD;  Location: [facility] INV[facility] CV LAB;  Service: Cardiovascular;  Laterality: N/A;   BALLOON DILATION N/A 10/12/2015   Procedure: BALLOON DILATION;  Surgeon: Gladis MARLA Louder, MD;  Location: [facility] ENDOSCOPY;  Service: Endoscopy;  Laterality: N/A;   CARDIAC CATHETERIZATION     in [place]   COLONOSCOPY W/ POLYPECTOMY     x2 colon polyps found   COLONOSCOPY WITH PROPOFOL  N/A 10/12/2015   Procedure: COLONOSCOPY WITH PROPOFOL ;  Surgeon: Gladis MARLA Louder, MD;  Location: [facility] ENDOSCOPY;  Service: Endoscopy;  Laterality: N/A;   ESOPHAGOGASTRODUODENOSCOPY (EGD) WITH PROPOFOL  N/A 10/12/2015   Procedure: ESOPHAGOGASTRODUODENOSCOPY (EGD) WITH PROPOFOL ;  Surgeon: Gladis MARLA Louder, MD;  Location: [facility] END[facility];  Service: Endoscopy;  Laterality: N/A;   KNEE ARTHROSCOPY WITH MEDIAL MENISECTOMY Right 06/03/2024   Procedure: ARTHROSCOPY, KNEE, WITH MEDIAL MENISCECTOMY;  Surgeon: Sheril Coy, MD;  Location: [facility] ORS;  Service: Orthopedics;  Laterality: Right;  RIGHT KNEE ARTHOSCOPY WITH MEDIAL MENISCECTOMY, partial medial chondroplasty   SHOULDER ARTHROTOMY     x2 procedures- 1 open, 1 scope.   TONSILLECTOMY     TRANSURETHRAL RESECTION OF PROSTATE     WISDOM TOOTH EXTRACTION      Current Outpatient Medications  Medication Sig Dispense Refill   apixaban  (ELIQUIS ) 5 MG TABS tablet Take 1 tablet (5 mg total) by mouth 2 (two) times daily. 60 tablet    Calcium  Carbonate-Vitamin D  500-5 MG-MCG TABS Take 1 tablet by mouth daily with breakfast.     Cholecalciferol  (VITAMIN D ) 50 MCG (2000 UT) tablet Take 2,000 Units by mouth daily.     Coenzyme Q10 (COQ-10) 200 MG CAPS Take  200 mg by mouth at bedtime.     diltiazem  (DILT-XR) 180 MG 24 hr capsule TAKE 1 CAPSULE(180 MG) BY MOUTH DAILY 90 capsule 2   folic acid  (FOLVITE ) 1 MG tablet Take 1 mg by mouth daily.     ipratropium (ATROVENT) 0.06 % nasal spray Place 1 spray into both nostrils 2 (two) times daily as needed for rhinitis.     lansoprazole (PREVACID) 30 MG capsule Take 30 mg by mouth daily.     LINZESS 145 MCG CAPS capsule Take 145 mcg by mouth daily before breakfast.     lisinopril -hydrochlorothiazide  (ZESTORETIC ) 20-25 MG tablet Take 1 tablet by mouth daily.     MAGNESIUM CITRATE PO Take 400 mg by mouth in the morning.     pantoprazole  (PROTONIX ) 40 MG tablet Take 1 tablet (40 mg total) by mouth daily. 45 tablet 0   potassium chloride  SA (K-DUR,KLOR-CON ) 20 MEQ tablet Take 20 mEq by mouth at bedtime.     REPATHA  SURECLICK 140 MG/ML SOAJ ADMINISTER 1 ML  UNDER THE SKIN EVERY 14 DAYS 2 mL 11   Testosterone  20.25 MG/ACT (1.62%) GEL Apply 4 Pump topically daily.  1   TURMERIC PO Take 2,000 mg by mouth daily.     vitamin B-12 (CYANOCOBALAMIN ) 1000 MCG tablet Take 1,000 mcg by mouth daily.     No current facility-administered medications for this encounter.    Allergies  Allergen Reactions   Shellfish Allergy  Diarrhea and Nausea And Vomiting   Lactose Diarrhea   Lipitor [Atorvastatin] Other (See Comments)   Multaq  [Dronedarone ]     Terrible burning in his stomach   Zonisamide Other (See Comments)    Dizziness   Crestor  [Rosuvastatin ] Other (See Comments)    Muscle aches    ROS- All systems are reviewed and negative except as per the HPI above.  Physical Exam: Vitals:   08/27/24 1014  BP: 122/62  Pulse: 61  Weight: 104.2 kg  Height: 5' 8.5 (1.74 m)    GEN- The patient is well appearing, alert and oriented x 3 today.   Neck - no JVD or carotid bruit noted Lungs- Clear to ausculation bilaterally, normal work of breathing Heart- Regular rate and rhythm, no murmurs, rubs or gallops, PMI not  laterally displaced Extremities- no clubbing, cyanosis, or edema Skin - no rash or ecchymosis noted  Wt Readings from Last 3 Encounters:  08/27/24 104.2 kg  07/28/24 76.7 kg  06/03/24 103 kg    EKG today demonstrates  Vent. rate 61 BPM PR interval 164 ms QRS duration 90 ms QT/QTcB 388/390 ms P-R-T axes 76 2 -6 Sinus rhythm with Premature atrial complexes in a pattern of bigeminy Otherwise normal ECG When compared with ECG of 28-Jul-2024 09:22, Premature atrial complexes are now Present  Echo 09/18/23 demonstrated   1. Left ventricular ejection fraction, by estimation, is 60 to 65%. The  left ventricle has normal function. The left ventricle has no regional  wall motion abnormalities. There is moderate left ventricular hypertrophy.  Left ventricular diastolic  parameters are consistent with Grade II diastolic dysfunction  (pseudonormalization).   2. Right ventricular systolic function is normal. The right ventricular  size is normal. There is normal pulmonary artery systolic pressure.   3. Left atrial size was mildly dilated.   4. The mitral valve is normal in structure. Trivial mitral valve  regurgitation. No evidence of mitral stenosis.   5. The aortic valve is tricuspid. There is mild calcification of the  aortic valve. There is mild thickening of the aortic valve. Aortic valve  regurgitation is mild. No aortic stenosis is present.   6. Aortic dilatation noted. There is mild dilatation of the ascending  aorta, measuring 42 mm.   7. The inferior vena cava is normal in size with greater than 50%  respiratory variability, suggesting right atrial pressure of 3 mmHg.    Epic records are reviewed at length today  CHA2DS2-VASc Score = 3  The patient's score is based upon: CHF History: 0 HTN History: 1 Diabetes History: 0 Stroke History: 0 Vascular Disease History: 1 (per imaging studies) Age Score: 1 Gender Score: 0      ASSESSMENT AND PLAN: 1. Paroxysmal Atrial  Fibrillation/atrial flutter The patient's CHA2DS2-VASc score is 3, indicating a 3.2% annual risk of stroke.   S/p afib ablation with Dr Cindie 11/25/20 S/p afib ablation on 07/28/24 by Dr. Cindie.  Patient is currently in NSR. Continue diltiazem  180 mg daily.  2. Secondary Hypercoagulable State (ICD10:  D68.69) The patient is at significant risk for  stroke/thromboembolism based upon his CHA2DS2-VASc Score of 3.  Continue Apixaban  (Eliquis ).  Continue Eliquis .   3. HTN Stable today.    Follow up with EP as scheduled.   Manuel Heinrich, PA-C [facility] [address] [place], [place] [address] [phone] 08/27/2024 10:47 AM

## 2024-09-03 NOTE — Progress Notes (Unsigned)
 Cardiology Office Note:    Date:  09/04/2024   ID:  SYON TEWS, DOB 12-11-1950, MRN [id number]  PCP:  Charlott Dorn LABOR, MD  Cardiologist:  Lonni LITTIE Nanas, MD  Electrophysiologist:  OLE ONEIDA HOLTS, MD   Referring MD: Charlott Dorn LABOR, *   Chief Complaint  Patient presents with   Atrial Fibrillation     History of Present Illness:    [name] is a 73 y.o. male with a hx of CAD, paroxysmal atrial fibrillation, hypertension, OSA, DVT, prostate cancer, PAD, hyperlipidemia who presents for follow-up.  He has had issues with recurrent venous thromboembolism, currently on Eliquis .  He was admitted to [facility] in October 2017 with atrial fibrillation.  He converted to sinus rhythm during admission and was started on flecainide  and diltiazem .  Echocardiogram at that time showed normal LV function, moderate to severe left atrial dilatation, no significant valvular disease.  He spends 6 months/year in [place]  and reports that in 2020 established with a cardiologist in [place] .  Stress test was done, though patient denied having any symptoms.  Stress test was abnormal and underwent cardiac catheterization which showed 60 to 70% stenosis of D1.  Medical management was recommended.    Echocardiogram 09/08/2020 showed normal biventricular function, mild AI, dilatation of the ascending aorta measuring 42 mm.  CTA chest on 09/20/2020 showed mild dilatation of the ascending aorta measuring up to 40 mm.  MRA 09/12/2021 showed stable ascending aortic dilatation measuring 40 mm.  Underwent A. fib ablation with Dr. HOLTS on 11/25/2020.  Echocardiogram 09/18/2023 showed EF 60 to 65%, moderate LVH, grade 2 diastolic dysfunction, normal RV function, no significant valvular disease, dilated ascending aorta measuring 42 mm.  He underwent repeat A-fib ablation on 07/28/2024  Since last clinic visit, he reports he is doing well.  Has not noted any further A-fib though states that his  watch has recorded an A-fib burden of 60%.  Does note some swelling in his feet. Denies any chest pain, dyspnea, lightheadedness, syncope, or palpitations.  No bleeding on eliquis .  Using CPAP every night.    Wt Readings from Last 3 Encounters:  09/04/24 230 lb 12.8 oz (104.7 kg)  08/27/24 229 lb 12.8 oz (104.2 kg)  07/28/24 169 lb (76.7 kg)       Past Medical History:  Diagnosis Date   Arthritis    arthritis -back   Atrial fibrillation (HCC)    a. diagnosed 09/2016 - started on Cardizem  CD and Flecainide . Continue Coumadin  for anticoagulation.   Cancer [facility])    cancer Prostate- surgery only   Chronic anticoagulation 09/10/2016   History of DVT (deep vein thrombosis)    History of DVT of lower extremity    left leg has some residual crculation issues   History of prostate cancer    Hyperlipidemia 09/09/2018   Hypertension    Hypertensive heart disease without CHF 09/29/2016   Obesity (BMI 30-39.9)    Paroxysmal atrial fibrillation (HCC)    CHA2DS2VASC score 2   Sleep apnea    a. uses CPAP   Sleep apnea in adult     Past Surgical History:  Procedure Laterality Date   ATRIAL FIBRILLATION ABLATION N/A 11/25/2020   Procedure: ATRIAL FIBRILLATION ABLATION;  Surgeon: HOLTS OLE ONEIDA, MD;  Location: [facility] INV[facility];  Service: Cardiovascular;  Laterality: N/A;   ATRIAL FIBRILLATION ABLATION N/A 07/28/2024   Procedure: ATRIAL FIBRILLATION ABLATION;  Surgeon: HOLTS OLE ONEIDA, MD;  Location: [facility] INVASIVE CV LAB;  Service:  Cardiovascular;  Laterality: N/A;   BALLOON DILATION N/A 10/12/2015   Procedure: BALLOON DILATION;  Surgeon: Gladis MARLA Louder, MD;  Location: [facility] ENDOSCOPY;  Service: Endoscopy;  Laterality: N/A;   CARDIAC CATHETERIZATION     in [place]   COLONOSCOPY W/ POLYPECTOMY     x2 colon polyps found   COLONOSCOPY WITH PROPOFOL  N/A 10/12/2015   Procedure: COLONOSCOPY WITH PROPOFOL ;  Surgeon: Gladis MARLA Louder, MD;  Location: [facility] ENDOSCOPY;  Service: Endoscopy;  Laterality:  N/A;   ESOPHAGOGASTRODUODENOSCOPY (EGD) WITH PROPOFOL  N/A 10/12/2015   Procedure: ESOPHAGOGASTRODUODENOSCOPY (EGD) WITH PROPOFOL ;  Surgeon: Gladis MARLA Louder, MD;  Location: [facility] ENDOS[facility];  Service: Endoscopy;  Laterality: N/A;   KNEE ARTHROSCOPY WITH MEDIAL MENISECTOMY Right 06/03/2024   Procedure: ARTHROSCOPY, KNEE, WITH MEDIAL MENISCECTOMY;  Surgeon: Sheril Coy, MD;  Location: [facility] ORS;  Service: Orthopedics;  Laterality: Right;  RIGHT KNEE ARTHOSCOPY WITH MEDIAL MENISCECTOMY, partial medial chondroplasty   SHOULDER ARTHROTOMY     x2 procedures- 1 open, 1 scope.   TONSILLECTOMY     TRANSURETHRAL RESECTION OF PROSTATE     WISDOM TOOTH EXTRACTION      Current Medications: Current Meds  Medication Sig   apixaban  (ELIQUIS ) 5 MG TABS tablet Take 1 tablet (5 mg total) by mouth 2 (two) times daily.   Calcium  Carbonate-Vitamin D  500-5 MG-MCG TABS Take 1 tablet by mouth daily with breakfast.   Cholecalciferol  (VITAMIN D ) 50 MCG (2000 UT) tablet Take 2,000 Units by mouth daily.   Coenzyme Q10 (COQ-10) 200 MG CAPS Take 200 mg by mouth at bedtime.   folic acid  (FOLVITE ) 1 MG tablet Take 1 mg by mouth daily.   ipratropium (ATROVENT) 0.06 % nasal spray Place 1 spray into both nostrils 2 (two) times daily as needed for rhinitis.   LINZESS 145 MCG CAPS capsule Take 145 mcg by mouth daily before breakfast.   lisinopril -hydrochlorothiazide  (ZESTORETIC ) 20-25 MG tablet Take 1 tablet by mouth daily.   MAGNESIUM CITRATE PO Take 400 mg by mouth in the morning.   potassium chloride  SA (K-DUR,KLOR-CON ) 20 MEQ tablet Take 20 mEq by mouth at bedtime.   REPATHA  SURECLICK 140 MG/ML SOAJ ADMINISTER 1 ML UNDER THE SKIN EVERY 14 DAYS   Testosterone  20.25 MG/ACT (1.62%) GEL Apply 4 Pump topically daily.   TURMERIC PO Take 2,000 mg by mouth daily.   vitamin B-12 (CYANOCOBALAMIN ) 1000 MCG tablet Take 1,000 mcg by mouth daily.   [DISCONTINUED] diltiazem  (DILT-XR) 180 MG 24 hr capsule TAKE 1 CAPSULE(180 MG) BY MOUTH  DAILY     Allergies:   Shellfish allergy , Lactose, Lipitor [atorvastatin], Multaq  [dronedarone ], Zonisamide, and Crestor  [rosuvastatin ]   Social History   Socioeconomic History   Marital status: Married    Spouse name: Not on file   Number of children: Not on file   Years of education: Not on file   Highest education level: Not on file  Occupational History   Not on file  Tobacco Use   Smoking status: Former    Current packs/day: 0.00    Types: Cigarettes    Start date: 12/05/1983    Quit date: 12/04/1998    Years since quitting: 25.7   Smokeless tobacco: Never   Tobacco comments:    Former smoker 10/08/23  Vaping Use   Vaping status: Never Used  Substance and Sexual Activity   Alcohol use: No   Drug use: No   Sexual activity: Not on file  Other Topics Concern   Not on file  Social History Narrative  Not on file   Social Drivers of Health   Financial Resource Strain: Not on file  Food Insecurity: Not on file  Transportation Needs: Not on file  Physical Activity: Not on file  Stress: Not on file  Social Connections: Not on file     Family History: The patient's family history includes Allergic rhinitis in his brother and sister; Hypertension in his brother and sister; Prostate cancer in his brother.  ROS:   Please see the history of present illness.     All other systems reviewed and are negative.  EKGs/Labs/Other Studies Reviewed:    The following studies were reviewed today:  US  Venous Left LE 11/30/2020: 1. No evidence of acute DVT within the left lower extremity. 2. The examination is positive for nonocclusive wall thickening/chronic DVT involving the distal aspect of the left femoral vein and the left popliteal vein, similar to remote examination performed in 2010. 3. Chronic occlusive superficial thrombophlebitis involving the proximal aspect of the left greater saphenous vein as well as the imaged portions of the left lesser saphenous vein.  EP  Study/Afib Ablation 11/25/2020: CONCLUSIONS: 1. Successful PVI 2. Intracardiac echo reveals vertical heart, normal LV function, 4 PVs and trivial pericardial effusion. 3. No early apparent complications.  CT Angio Chest 09/20/2020: 1. Ascending thoracic aortic caliber 4.0 cm axial dimension. Mildly aneurysmal. Recommend annual imaging followup by CTA or MRA. This recommendation follows 2010 ACCF/AHA/AATS/ACR/ASA/SCA/SCAI/SIR/STS/SVM Guidelines for the Diagnosis and Management of Patients with Thoracic Aortic Disease. Circulation. 2010; 121: Z733-z630. Aortic aneurysm NOS (ICD10-I71.9) 2. Coronary artery disease with calcification of the LEFT coronary circulation. 3. Renal and cystic hepatic lesions. Please see prior abdominal CT for further detail regarding follow-up. 4. Aortic atherosclerosis.   Aortic Atherosclerosis (ICD10-I70.0).  Echo 09/08/2020:  1. Left ventricular ejection fraction, by estimation, is 60 to 65%. The  left ventricle has normal function. The left ventricle has no regional  wall motion abnormalities. There is mild concentric left ventricular  hypertrophy. Left ventricular diastolic  parameters were normal.   2. Right ventricular systolic function is normal. The right ventricular  size is normal. There is normal pulmonary artery systolic pressure. The  estimated right ventricular systolic pressure is 19.0 mmHg.   3. The mitral valve is grossly normal. Trivial mitral valve  regurgitation. No evidence of mitral stenosis.   4. The aortic valve is tricuspid. There is moderate calcification of the  aortic valve. There is moderate thickening of the aortic valve. Aortic  valve regurgitation is mild. Mild to moderate aortic valve  sclerosis/calcification is present, without any  evidence of aortic stenosis.   5. There is mild dilatation of the ascending aorta, measuring 42 mm.   6. The inferior vena cava is normal in size with greater than 50%  respiratory  variability, suggesting right atrial pressure of 3 mmHg.   Comparison(s): Changes from prior study are noted. EF normal 60-65%.  Normal LA. Mild AI.   EKG:   04/25/23: NSR with PACs, rate 64 10/10/22: NSR with PACs, rate 65 05/30/2022: Sinus rhythm, rate 64, PACs, no ST abnormalities 05/12/2021: Sinus rhythm with frequent PACs, rate 63, no ST abnormalities 10/11/2020: EKG was not ordered.  08/25/2020: normal sinus rhythm, rate 60, no ST/T abnormalities.    Recent Labs: 05/02/2024: ALT 14; BNP 28.4; Magnesium 2.3 07/07/2024: BUN 11; Creatinine, Ser 1.00; Hemoglobin 14.2; Platelets 220; Potassium 4.6; Sodium 139  Recent Lipid Panel    Component Value Date/Time   CHOL 119 05/02/2024 0824   TRIG 49  05/02/2024 0824   HDL 62 05/02/2024 0824   CHOLHDL 1.9 05/02/2024 0824   CHOLHDL 3.4 09/09/2016 0421   VLDL 15 09/09/2016 0421   LDLCALC 45 05/02/2024 0824    Physical Exam:    VS:  BP 122/68 (Patient Position: Sitting)   Pulse 68   Ht 6' (1.829 m)   Wt 230 lb 12.8 oz (104.7 kg)   BMI 31.30 kg/m     Wt Readings from Last 3 Encounters:  09/04/24 230 lb 12.8 oz (104.7 kg)  08/27/24 229 lb 12.8 oz (104.2 kg)  07/28/24 169 lb (76.7 kg)     GEN:  Well nourished, well developed in no acute distress HEENT: Normal NECK: No JVD; No carotid bruits CARDIAC: RRR, no murmurs, rubs, gallops RESPIRATORY:  Clear to auscultation without rales, wheezing or rhonchi  ABDOMEN: Soft, non-tender, non-distended MUSCULOSKELETAL:  Trace edema; No deformity  SKIN: Warm and dry NEUROLOGIC:  Alert and oriented x 3 PSYCHIATRIC:  Normal affect   ASSESSMENT:    1. Paroxysmal atrial fibrillation (HCC)   2. Aortic dilatation   3. Coronary artery disease involving native coronary artery of native heart without angina pectoris   4. Essential hypertension   5. Mixed hyperlipidemia      PLAN:    Paroxysmal atrial fibrillation/flutter: CHA2DS2-VASc score 3 (hypertension, age, PAD).  Echocardiogram 09/08/2020  showed normal biventricular function, no significant valvular disease.  Underwent ablation with Dr. Cindie on 11/25/2020.  He started on Multaq  but was discontinued due to GI issues.  Noted 12 to 15% A-fib burden on Apple Watch.  He underwent repeat A-fib ablation on 07/28/2024 -Continue Eliquis  5 mg twice daily -Continue diltiazem  180 mg daily  CAD: Catheterization in [place]  in 2021 showed 60 to 70% D1 stenosis.  Managing medically.  Currently denies any chest pain.  Calcium  score on preablation CT 07/2024 was 948 (79th percentile) -Continue Eliquis   -Continue Repatha   Aortic dilatation: Ascending aorta measures 40 mm on CTA 09/20/2020.  MRA 09/12/2021 showed stable ascending aortic dilatation measuring 40 mm.  Aortic dilatation measured 42 mm on echocardiogram 09/18/2023.  CTA in 07/2024 showed aorta measured 41mm - Given stability of aortic dilatation since 2021, we will plan repeat echo to monitor in 2 years  Lower extremity edema: Suspect due to venous insufficiency, recommend compression stockings.  Normal CMET, BNP on 04/2024.  Trace edema on exam today  Hypertension: On lisinopril -hydrochlorothiazide  20-12.5 mg daily and diltiazem  180 mg daily.  Appears controlled.    PAD: Continue Eliquis  and statin  Hyperlipidemia: On atorvastatin 10 mg daily, LDL 57 on 05/30/2022.  However had to stop atorvastatin due to myalgias.  Started rosuvastatin  5 mg daily but unable to tolerate.  Referred to pharmacy lipid clinic and started on Repatha .  LDL 45 on 05/02/2024  DVT: History of recurrent venous thromboembolism.  Continue Eliquis .    OSA: Continue CPAP.  Reports compliance.   Prediabetes: A1c 5.7% on 05/30/2022  Bone lesion: MRI showed abnormal enhancement in ninth rib.  Given history of prostate cancer, could be concerning for osseous metastatic disease.  PSA undetectable.  Bone scan showed focal activity at bilateral ninth costovertebral junctions, felt to be degenerative or posttraumatic    Hemorrhoids: reports occasional bleeding.  Referred to GI for evaluation, he was referred to general surgery suspected issue was more constipation than hemorrhoids and recommend follow-up with GI. Reports no recent bleeding  RTC in 6 months  Medication Adjustments/Labs and Tests Ordered: Current medicines are reviewed at length with the patient  today.  Concerns regarding medicines are outlined above.  No orders of the defined types were placed in this encounter.   Meds ordered this encounter  Medications   diltiazem  (DILT-XR) 180 MG 24 hr capsule    Sig: Take 1 capsule (180 mg total) by mouth daily.    Dispense:  90 capsule    Refill:  3     Patient Instructions  Medication Instructions:  Your physician recommends that you continue on your current medications as directed. Please refer to the Current Medication list given to you today.  *If you need a refill on your cardiac medications before your next appointment, please call your pharmacy*  Lab Work: None ordered  If you have any lab test that is abnormal or we need to change your treatment, we will call you to review the results.  Testing/Procedures: Cancelling echocardiogram in November -- this testing is not needed at this time.  Follow-Up: At [facility], you and your health needs are our priority.  As part of our continuing mission to provide you with exceptional heart care, our providers are all part of one team.  This team includes your primary Cardiologist (physician) and Advanced Practice Providers or APPs (Physician Assistants and Nurse Practitioners) who all work together to provide you with the care you need, when you need it.  Your next appointment:   6 month(s)  Provider:   Dr Kate    Thank you for choosing [facility]!!   [phone]           Signed, Lonni LITTIE Kate, MD  09/04/2024 9:46 AM    [facility]

## 2024-09-04 ENCOUNTER — Ambulatory Visit: Attending: Cardiology | Admitting: Cardiology

## 2024-09-04 VITALS — BP 122/68 | HR 68 | Ht 72.0 in | Wt 230.8 lb

## 2024-09-04 DIAGNOSIS — I2510 Atherosclerotic heart disease of native coronary artery without angina pectoris: Secondary | ICD-10-CM | POA: Diagnosis not present

## 2024-09-04 DIAGNOSIS — I480 Paroxysmal atrial fibrillation: Secondary | ICD-10-CM | POA: Diagnosis not present

## 2024-09-04 DIAGNOSIS — I77819 Aortic ectasia, unspecified site: Secondary | ICD-10-CM | POA: Insufficient documentation

## 2024-09-04 DIAGNOSIS — E782 Mixed hyperlipidemia: Secondary | ICD-10-CM | POA: Insufficient documentation

## 2024-09-04 DIAGNOSIS — I10 Essential (primary) hypertension: Secondary | ICD-10-CM | POA: Diagnosis not present

## 2024-09-04 MED ORDER — DILTIAZEM HCL ER 180 MG PO CP24: 180.0000 mg | ORAL_CAPSULE | Freq: Every day | ORAL | 3 refills | Status: AC

## 2024-09-04 NOTE — Patient Instructions (Signed)
 Medication Instructions:  Your physician recommends that you continue on your current medications as directed. Please refer to the Current Medication list given to you today.  *If you need a refill on your cardiac medications before your next appointment, please call your pharmacy*  Lab Work: None ordered  If you have any lab test that is abnormal or we need to change your treatment, we will call you to review the results.  Testing/Procedures: Cancelling echocardiogram in November -- this testing is not needed at this time.  Follow-Up: At [facility], you and your health needs are our priority.  As part of our continuing mission to provide you with exceptional heart care, our providers are all part of one team.  This team includes your primary Cardiologist (physician) and Advanced Practice Providers or APPs (Physician Assistants and Nurse Practitioners) who all work together to provide you with the care you need, when you need it.  Your next appointment:   6 month(s)  Provider:   Dr Kate    Thank you for choosing [facility]!!   [phone]

## 2024-09-10 DIAGNOSIS — Z23 Encounter for immunization: Secondary | ICD-10-CM | POA: Diagnosis not present

## 2024-09-15 ENCOUNTER — Other Ambulatory Visit: Payer: Self-pay | Admitting: Cardiology

## 2024-09-15 DIAGNOSIS — E782 Mixed hyperlipidemia: Secondary | ICD-10-CM

## 2024-09-15 DIAGNOSIS — I2510 Atherosclerotic heart disease of native coronary artery without angina pectoris: Secondary | ICD-10-CM

## 2024-09-16 DIAGNOSIS — H35372 Puckering of macula, left eye: Secondary | ICD-10-CM | POA: Diagnosis not present

## 2024-09-16 DIAGNOSIS — H2513 Age-related nuclear cataract, bilateral: Secondary | ICD-10-CM | POA: Diagnosis not present

## 2024-09-16 DIAGNOSIS — H43391 Other vitreous opacities, right eye: Secondary | ICD-10-CM | POA: Diagnosis not present

## 2024-09-19 DIAGNOSIS — Z86718 Personal history of other venous thrombosis and embolism: Secondary | ICD-10-CM | POA: Diagnosis not present

## 2024-09-19 DIAGNOSIS — I10 Essential (primary) hypertension: Secondary | ICD-10-CM | POA: Diagnosis not present

## 2024-09-19 DIAGNOSIS — R600 Localized edema: Secondary | ICD-10-CM | POA: Diagnosis not present

## 2024-09-19 DIAGNOSIS — K5901 Slow transit constipation: Secondary | ICD-10-CM | POA: Diagnosis not present

## 2024-09-19 DIAGNOSIS — I7121 Aneurysm of the ascending aorta, without rupture: Secondary | ICD-10-CM | POA: Diagnosis not present

## 2024-09-19 DIAGNOSIS — I700 Atherosclerosis of aorta: Secondary | ICD-10-CM | POA: Diagnosis not present

## 2024-09-19 DIAGNOSIS — R7303 Prediabetes: Secondary | ICD-10-CM | POA: Diagnosis not present

## 2024-09-19 DIAGNOSIS — M48061 Spinal stenosis, lumbar region without neurogenic claudication: Secondary | ICD-10-CM | POA: Diagnosis not present

## 2024-09-19 DIAGNOSIS — D6869 Other thrombophilia: Secondary | ICD-10-CM | POA: Diagnosis not present

## 2024-09-19 DIAGNOSIS — I2510 Atherosclerotic heart disease of native coronary artery without angina pectoris: Secondary | ICD-10-CM | POA: Diagnosis not present

## 2024-09-19 DIAGNOSIS — I480 Paroxysmal atrial fibrillation: Secondary | ICD-10-CM | POA: Diagnosis not present

## 2024-09-19 DIAGNOSIS — M810 Age-related osteoporosis without current pathological fracture: Secondary | ICD-10-CM | POA: Diagnosis not present

## 2024-09-21 ENCOUNTER — Other Ambulatory Visit (HOSPITAL_BASED_OUTPATIENT_CLINIC_OR_DEPARTMENT_OTHER): Payer: Self-pay | Admitting: Internal Medicine

## 2024-09-21 DIAGNOSIS — M810 Age-related osteoporosis without current pathological fracture: Secondary | ICD-10-CM

## 2024-09-22 DIAGNOSIS — K648 Other hemorrhoids: Secondary | ICD-10-CM | POA: Diagnosis not present

## 2024-09-22 DIAGNOSIS — I480 Paroxysmal atrial fibrillation: Secondary | ICD-10-CM | POA: Diagnosis not present

## 2024-09-22 DIAGNOSIS — K5909 Other constipation: Secondary | ICD-10-CM | POA: Diagnosis not present

## 2024-10-02 DIAGNOSIS — L820 Inflamed seborrheic keratosis: Secondary | ICD-10-CM | POA: Diagnosis not present

## 2024-10-02 DIAGNOSIS — L720 Epidermal cyst: Secondary | ICD-10-CM | POA: Diagnosis not present

## 2024-10-02 DIAGNOSIS — L570 Actinic keratosis: Secondary | ICD-10-CM | POA: Diagnosis not present

## 2024-10-02 DIAGNOSIS — L814 Other melanin hyperpigmentation: Secondary | ICD-10-CM | POA: Diagnosis not present

## 2024-10-02 DIAGNOSIS — D1801 Hemangioma of skin and subcutaneous tissue: Secondary | ICD-10-CM | POA: Diagnosis not present

## 2024-10-02 DIAGNOSIS — Z85828 Personal history of other malignant neoplasm of skin: Secondary | ICD-10-CM | POA: Diagnosis not present

## 2024-10-02 DIAGNOSIS — C44612 Basal cell carcinoma of skin of right upper limb, including shoulder: Secondary | ICD-10-CM | POA: Diagnosis not present

## 2024-10-02 DIAGNOSIS — L821 Other seborrheic keratosis: Secondary | ICD-10-CM | POA: Diagnosis not present

## 2024-10-02 DIAGNOSIS — D225 Melanocytic nevi of trunk: Secondary | ICD-10-CM | POA: Diagnosis not present

## 2024-10-07 ENCOUNTER — Ambulatory Visit: Admitting: Physician Assistant

## 2024-10-07 ENCOUNTER — Other Ambulatory Visit (HOSPITAL_COMMUNITY)

## 2024-10-08 DIAGNOSIS — K649 Unspecified hemorrhoids: Secondary | ICD-10-CM | POA: Diagnosis not present

## 2024-10-08 DIAGNOSIS — K5904 Chronic idiopathic constipation: Secondary | ICD-10-CM | POA: Diagnosis not present

## 2024-10-08 NOTE — Progress Notes (Unsigned)
 Electrophysiology Office Note:   Date:  10/09/2024  ID:  [name], DOB [birth year], MRN [id number]  Primary Cardiologist: Lonni LITTIE Nanas, MD Primary Heart Failure: None Electrophysiologist: OLE ONEIDA HOLTS, MD      History of Present Illness:   [name] is a 73 y.o. male with h/o AF, AFL, HTN, HLD, CAD, OSA, DVT, obesity seen today for routine electrophysiology follow-up s/p Ablation.  Since last being seen in our clinic the patient reports doing well post ablation. He states most days he has not been alerted of AF. His Apple Watch has largely showed 2% AF burden. He did have one day that was alerted for 8% burden.  He was not aware of any AF symptoms.     He denies chest pain, palpitations, dyspnea, PND, orthopnea, nausea, vomiting, dizziness, syncope, edema, weight gain, or early satiety.    Review of systems complete and found to be negative unless listed in HPI.   EP Information / Studies Reviewed:    EKG is ordered today. Personal review as below.  EKG Interpretation Date/Time:  Thursday October 09 2024 09:41:24 EST Ventricular Rate:  64 PR Interval:  162 QRS Duration:  92 QT Interval:  382 QTC Calculation: 394 R Axis:   2  Text Interpretation: Normal sinus rhythm with sinus arrhythmia Normal ECG Confirmed by Aniceto Jarvis ([id number]) on 10/09/2024 9:47:54 AM    Arrhythmia / AAD / Pertinent EP Studies AF > initial dx 09/2016 Flecainide  2017 > 08/25/20 Dronedarone  > stopped 04/2024 due to GI issues EPS 11/25/20 > PVI ablation  EPS 07/28/24 > successful redo PVI and posterior wall ablation   Risk Assessment/Calculations:    CHA2DS2-VASc Score = 3   This indicates a 3.2% annual risk of stroke. The patient's score is based upon: CHF History: 0 HTN History: 1 Diabetes History: 0 Stroke History: 0 Vascular Disease History: 1 (per imaging studies) Age Score: 1 Gender Score: 0             Physical Exam:   VS:  BP 108/64   Pulse 64   Ht  6' (1.829 m)   Wt 232 lb 9.6 oz (105.5 kg)   SpO2 96%   BMI 31.55 kg/m    Wt Readings from Last 3 Encounters:  10/09/24 232 lb 9.6 oz (105.5 kg)  09/04/24 230 lb 12.8 oz (104.7 kg)  08/27/24 229 lb 12.8 oz (104.2 kg)     GEN: Well nourished, well developed in no acute distress NECK: No JVD; No carotid bruits CARDIAC: Regular rate and rhythm, no murmurs, rubs, gallops RESPIRATORY:  Clear to auscultation without rales, wheezing or rhonchi  ABDOMEN: Soft, non-tender, non-distended EXTREMITIES:  No edema; No deformity   ASSESSMENT AND PLAN:     Paroxysmal Atrial Fibrillation CHA2DS2-VASc 3, s/p ablation 2021, 07/2024 -continue OAC for stroke prophylaxis -EKG with NSR -Diltiazem  180 mg daily  -discussed blinding period with patient -he asks about an emergency plan as he had RVR in the past (ok to break a 180 mg tablet in half and wait an hour or so before going to the ER).  Instructed him to call if that occurs and we can send in a short acting cardizem .  It has not happened and he is post redo ablation 07/2024 -no symptom burden post ablation  -monitors with Apple Watch   Secondary Hypercoagulable State  -continue Eliquis  5mg  BID, dose reviewed and appropriate by age  Hypertension  -well controlled on current regimen     Follow  up with EP APP 7 months  > transition to new EP MD / Dr. Kennyth   Signed, Daphne Barrack, NP-C, AGACNP-BC [facility] - Electrophysiology  10/09/2024, 10:03 AM

## 2024-10-09 ENCOUNTER — Encounter: Payer: Self-pay | Admitting: Pulmonary Disease

## 2024-10-09 ENCOUNTER — Ambulatory Visit: Attending: Pulmonary Disease | Admitting: Pulmonary Disease

## 2024-10-09 VITALS — BP 108/64 | HR 64 | Ht 72.0 in | Wt 232.6 lb

## 2024-10-09 DIAGNOSIS — I480 Paroxysmal atrial fibrillation: Secondary | ICD-10-CM | POA: Diagnosis not present

## 2024-10-09 DIAGNOSIS — I10 Essential (primary) hypertension: Secondary | ICD-10-CM | POA: Insufficient documentation

## 2024-10-09 DIAGNOSIS — D6869 Other thrombophilia: Secondary | ICD-10-CM | POA: Insufficient documentation

## 2024-10-09 NOTE — Patient Instructions (Signed)
 Medication Instructions:  No medications changes today   *If you need a refill on your cardiac medications before your next appointment, please call your pharmacy*  Lab Work: No lab work today If you have labs (blood work) drawn today and your tests are completely normal, you will receive your results only by: MyChart Message (if you have MyChart) OR A paper copy in the mail If you have any lab test that is abnormal or we need to change your treatment, we will call you to review the results.  Testing/Procedures: No testing/procedures were scheduled today  Follow-Up: At [facility], you and your health needs are our priority.  As part of our continuing mission to provide you with exceptional heart care, our providers are all part of one team.  This team includes your primary Cardiologist (physician) and Advanced Practice Providers or APPs (Physician Assistants and Nurse Practitioners) who all work together to provide you with the care you need, when you need it.  Your next appointment:   7 month(s) (Mid June)  Provider:   You may see OLE ONEIDA HOLTS, MD  > will transition you to Dr. Kennyth or one of the following Advanced Practice Providers on your designated Care Team:    Daphne Barrack, NP    We recommend signing up for the patient portal called MyChart.  Sign up information is provided on this After Visit Summary.  MyChart is used to connect with patients for Virtual Visits (Telemedicine).  Patients are able to view lab/test results, encounter notes, upcoming appointments, etc.  Non-urgent messages can be sent to your provider as well.   To learn more about what you can do with MyChart, go to [email or web address].

## 2024-10-10 ENCOUNTER — Ambulatory Visit: Admitting: Pulmonary Disease

## 2024-10-28 ENCOUNTER — Ambulatory Visit: Admitting: Physician Assistant

## 2024-11-05 DIAGNOSIS — K862 Cyst of pancreas: Secondary | ICD-10-CM | POA: Diagnosis not present

## 2024-11-07 DIAGNOSIS — I480 Paroxysmal atrial fibrillation: Secondary | ICD-10-CM | POA: Diagnosis not present

## 2024-11-07 DIAGNOSIS — E785 Hyperlipidemia, unspecified: Secondary | ICD-10-CM | POA: Diagnosis not present

## 2024-11-07 DIAGNOSIS — G894 Chronic pain syndrome: Secondary | ICD-10-CM | POA: Diagnosis not present

## 2024-11-07 DIAGNOSIS — I2510 Atherosclerotic heart disease of native coronary artery without angina pectoris: Secondary | ICD-10-CM | POA: Diagnosis not present

## 2024-11-07 DIAGNOSIS — K5904 Chronic idiopathic constipation: Secondary | ICD-10-CM | POA: Diagnosis not present

## 2024-11-07 DIAGNOSIS — I119 Hypertensive heart disease without heart failure: Secondary | ICD-10-CM | POA: Diagnosis not present

## 2024-11-07 DIAGNOSIS — M810 Age-related osteoporosis without current pathological fracture: Secondary | ICD-10-CM | POA: Diagnosis not present

## 2024-11-07 DIAGNOSIS — I77819 Aortic ectasia, unspecified site: Secondary | ICD-10-CM | POA: Diagnosis not present

## 2024-11-07 DIAGNOSIS — E291 Testicular hypofunction: Secondary | ICD-10-CM | POA: Diagnosis not present

## 2024-11-07 DIAGNOSIS — G4733 Obstructive sleep apnea (adult) (pediatric): Secondary | ICD-10-CM | POA: Diagnosis not present

## 2024-11-07 DIAGNOSIS — D490 Neoplasm of unspecified behavior of digestive system: Secondary | ICD-10-CM | POA: Diagnosis not present

## 2024-11-07 DIAGNOSIS — R7303 Prediabetes: Secondary | ICD-10-CM | POA: Diagnosis not present

## 2024-12-07 ENCOUNTER — Encounter: Payer: Self-pay | Admitting: Cardiology

## 2024-12-09 ENCOUNTER — Other Ambulatory Visit (HOSPITAL_COMMUNITY): Payer: Self-pay

## 2024-12-09 ENCOUNTER — Telehealth: Payer: Self-pay | Admitting: Pharmacy Technician

## 2024-12-09 NOTE — Telephone Encounter (Signed)
" ° °  Patient Advocate Encounter   The patient was approved for a [employer] grant that will help cover the cost of REPATHA  Total amount awarded, 2500.  Effective: 11/08/24 - 11/09/25   APW:[id number] ERW:[id number] Hmnle:[id number] PI:[id number] [employer] ID: [id number]   Pharmacy provided with approval and processing information. Patient informed via mychart   "

## 2025-05-12 ENCOUNTER — Ambulatory Visit: Admitting: Cardiology

## 2025-05-18 ENCOUNTER — Ambulatory Visit: Admitting: Cardiology
